# Patient Record
Sex: Female | Born: 1935
Health system: Southern US, Community
[De-identification: ages and names within clinical notes are randomized; demographics above are authoritative.]

## PROBLEM LIST (undated history)

## (undated) DIAGNOSIS — E785 Hyperlipidemia, unspecified: Secondary | ICD-10-CM

## (undated) DIAGNOSIS — I099 Rheumatic heart disease, unspecified: Secondary | ICD-10-CM

## (undated) DIAGNOSIS — I499 Cardiac arrhythmia, unspecified: Secondary | ICD-10-CM

## (undated) DIAGNOSIS — I10 Essential (primary) hypertension: Secondary | ICD-10-CM

## (undated) DIAGNOSIS — I509 Heart failure, unspecified: Secondary | ICD-10-CM

## (undated) HISTORY — DX: Hyperlipidemia, unspecified: E78.5

## (undated) HISTORY — PX: APPENDECTOMY: SHX54

## (undated) HISTORY — DX: Rheumatic heart disease, unspecified: I09.9

## (undated) HISTORY — DX: Cardiac arrhythmia, unspecified: I49.9

## (undated) HISTORY — DX: Heart failure, unspecified: I50.9

---

## 2011-11-26 DIAGNOSIS — J309 Allergic rhinitis, unspecified: Secondary | ICD-10-CM | POA: Diagnosis not present

## 2011-11-26 DIAGNOSIS — E538 Deficiency of other specified B group vitamins: Secondary | ICD-10-CM | POA: Diagnosis not present

## 2011-11-26 DIAGNOSIS — Z0000 Encounter for general adult medical examination without abnormal findings: Secondary | ICD-10-CM | POA: Diagnosis not present

## 2011-11-26 DIAGNOSIS — H9319 Tinnitus, unspecified ear: Secondary | ICD-10-CM | POA: Diagnosis not present

## 2011-12-10 DIAGNOSIS — I10 Essential (primary) hypertension: Secondary | ICD-10-CM | POA: Diagnosis not present

## 2011-12-10 DIAGNOSIS — Z0000 Encounter for general adult medical examination without abnormal findings: Secondary | ICD-10-CM | POA: Diagnosis not present

## 2011-12-10 DIAGNOSIS — H9319 Tinnitus, unspecified ear: Secondary | ICD-10-CM | POA: Diagnosis not present

## 2011-12-10 DIAGNOSIS — F411 Generalized anxiety disorder: Secondary | ICD-10-CM | POA: Diagnosis not present

## 2011-12-10 DIAGNOSIS — R079 Chest pain, unspecified: Secondary | ICD-10-CM | POA: Diagnosis not present

## 2011-12-24 DIAGNOSIS — M159 Polyosteoarthritis, unspecified: Secondary | ICD-10-CM | POA: Diagnosis not present

## 2011-12-24 DIAGNOSIS — I7090 Unspecified atherosclerosis: Secondary | ICD-10-CM | POA: Diagnosis not present

## 2011-12-24 DIAGNOSIS — G252 Other specified forms of tremor: Secondary | ICD-10-CM | POA: Diagnosis not present

## 2011-12-24 DIAGNOSIS — G250 Essential tremor: Secondary | ICD-10-CM | POA: Diagnosis not present

## 2011-12-24 DIAGNOSIS — I10 Essential (primary) hypertension: Secondary | ICD-10-CM | POA: Diagnosis not present

## 2011-12-24 DIAGNOSIS — I70209 Unspecified atherosclerosis of native arteries of extremities, unspecified extremity: Secondary | ICD-10-CM | POA: Diagnosis not present

## 2011-12-26 DIAGNOSIS — I10 Essential (primary) hypertension: Secondary | ICD-10-CM | POA: Diagnosis not present

## 2011-12-26 DIAGNOSIS — I7090 Unspecified atherosclerosis: Secondary | ICD-10-CM | POA: Diagnosis not present

## 2011-12-26 DIAGNOSIS — E538 Deficiency of other specified B group vitamins: Secondary | ICD-10-CM | POA: Diagnosis not present

## 2012-02-12 DIAGNOSIS — E538 Deficiency of other specified B group vitamins: Secondary | ICD-10-CM | POA: Diagnosis not present

## 2012-02-18 DIAGNOSIS — J309 Allergic rhinitis, unspecified: Secondary | ICD-10-CM | POA: Diagnosis not present

## 2012-02-18 DIAGNOSIS — Z0000 Encounter for general adult medical examination without abnormal findings: Secondary | ICD-10-CM | POA: Diagnosis not present

## 2012-02-18 DIAGNOSIS — L570 Actinic keratosis: Secondary | ICD-10-CM | POA: Diagnosis not present

## 2012-02-18 DIAGNOSIS — F411 Generalized anxiety disorder: Secondary | ICD-10-CM | POA: Diagnosis not present

## 2012-02-18 DIAGNOSIS — I10 Essential (primary) hypertension: Secondary | ICD-10-CM | POA: Diagnosis not present

## 2012-02-28 DIAGNOSIS — D485 Neoplasm of uncertain behavior of skin: Secondary | ICD-10-CM | POA: Diagnosis not present

## 2012-03-17 DIAGNOSIS — L723 Sebaceous cyst: Secondary | ICD-10-CM | POA: Diagnosis not present

## 2012-03-17 DIAGNOSIS — L578 Other skin changes due to chronic exposure to nonionizing radiation: Secondary | ICD-10-CM | POA: Diagnosis not present

## 2012-03-17 DIAGNOSIS — L821 Other seborrheic keratosis: Secondary | ICD-10-CM | POA: Diagnosis not present

## 2012-03-18 DIAGNOSIS — H26499 Other secondary cataract, unspecified eye: Secondary | ICD-10-CM | POA: Diagnosis not present

## 2012-03-24 DIAGNOSIS — D518 Other vitamin B12 deficiency anemias: Secondary | ICD-10-CM | POA: Diagnosis not present

## 2012-03-24 DIAGNOSIS — G250 Essential tremor: Secondary | ICD-10-CM | POA: Diagnosis not present

## 2012-03-24 DIAGNOSIS — G252 Other specified forms of tremor: Secondary | ICD-10-CM | POA: Diagnosis not present

## 2012-03-24 DIAGNOSIS — I10 Essential (primary) hypertension: Secondary | ICD-10-CM | POA: Diagnosis not present

## 2012-03-24 DIAGNOSIS — J309 Allergic rhinitis, unspecified: Secondary | ICD-10-CM | POA: Diagnosis not present

## 2012-03-24 DIAGNOSIS — IMO0001 Reserved for inherently not codable concepts without codable children: Secondary | ICD-10-CM | POA: Diagnosis not present

## 2012-03-26 DIAGNOSIS — Z1231 Encounter for screening mammogram for malignant neoplasm of breast: Secondary | ICD-10-CM | POA: Diagnosis not present

## 2012-04-07 DIAGNOSIS — E785 Hyperlipidemia, unspecified: Secondary | ICD-10-CM | POA: Diagnosis not present

## 2012-04-07 DIAGNOSIS — IMO0001 Reserved for inherently not codable concepts without codable children: Secondary | ICD-10-CM | POA: Diagnosis not present

## 2012-04-07 DIAGNOSIS — I10 Essential (primary) hypertension: Secondary | ICD-10-CM | POA: Diagnosis not present

## 2012-04-09 DIAGNOSIS — J309 Allergic rhinitis, unspecified: Secondary | ICD-10-CM | POA: Diagnosis not present

## 2012-04-09 DIAGNOSIS — I10 Essential (primary) hypertension: Secondary | ICD-10-CM | POA: Diagnosis not present

## 2012-04-09 DIAGNOSIS — R42 Dizziness and giddiness: Secondary | ICD-10-CM | POA: Diagnosis not present

## 2012-04-09 DIAGNOSIS — J019 Acute sinusitis, unspecified: Secondary | ICD-10-CM | POA: Diagnosis not present

## 2012-04-09 DIAGNOSIS — H9319 Tinnitus, unspecified ear: Secondary | ICD-10-CM | POA: Diagnosis not present

## 2012-04-22 DIAGNOSIS — D518 Other vitamin B12 deficiency anemias: Secondary | ICD-10-CM | POA: Diagnosis not present

## 2012-04-29 DIAGNOSIS — H9319 Tinnitus, unspecified ear: Secondary | ICD-10-CM | POA: Diagnosis not present

## 2012-04-29 DIAGNOSIS — I10 Essential (primary) hypertension: Secondary | ICD-10-CM | POA: Diagnosis not present

## 2012-04-29 DIAGNOSIS — M269 Dentofacial anomaly, unspecified: Secondary | ICD-10-CM | POA: Diagnosis not present

## 2012-04-29 DIAGNOSIS — Z0000 Encounter for general adult medical examination without abnormal findings: Secondary | ICD-10-CM | POA: Diagnosis not present

## 2012-05-05 DIAGNOSIS — M542 Cervicalgia: Secondary | ICD-10-CM | POA: Diagnosis not present

## 2012-05-05 DIAGNOSIS — H9319 Tinnitus, unspecified ear: Secondary | ICD-10-CM | POA: Diagnosis not present

## 2012-05-05 DIAGNOSIS — Z0000 Encounter for general adult medical examination without abnormal findings: Secondary | ICD-10-CM | POA: Diagnosis not present

## 2012-05-05 DIAGNOSIS — G43909 Migraine, unspecified, not intractable, without status migrainosus: Secondary | ICD-10-CM | POA: Diagnosis not present

## 2012-05-06 DIAGNOSIS — N952 Postmenopausal atrophic vaginitis: Secondary | ICD-10-CM | POA: Diagnosis not present

## 2012-05-06 DIAGNOSIS — Z124 Encounter for screening for malignant neoplasm of cervix: Secondary | ICD-10-CM | POA: Diagnosis not present

## 2012-05-06 DIAGNOSIS — K921 Melena: Secondary | ICD-10-CM | POA: Diagnosis not present

## 2012-05-06 DIAGNOSIS — K5901 Slow transit constipation: Secondary | ICD-10-CM | POA: Diagnosis not present

## 2012-05-07 DIAGNOSIS — Z0000 Encounter for general adult medical examination without abnormal findings: Secondary | ICD-10-CM | POA: Diagnosis not present

## 2012-05-07 DIAGNOSIS — J019 Acute sinusitis, unspecified: Secondary | ICD-10-CM | POA: Diagnosis not present

## 2012-05-08 DIAGNOSIS — J329 Chronic sinusitis, unspecified: Secondary | ICD-10-CM | POA: Diagnosis not present

## 2012-05-08 DIAGNOSIS — H9319 Tinnitus, unspecified ear: Secondary | ICD-10-CM | POA: Diagnosis not present

## 2012-05-14 DIAGNOSIS — I10 Essential (primary) hypertension: Secondary | ICD-10-CM | POA: Diagnosis not present

## 2012-05-14 DIAGNOSIS — I7090 Unspecified atherosclerosis: Secondary | ICD-10-CM | POA: Diagnosis not present

## 2012-05-14 DIAGNOSIS — J329 Chronic sinusitis, unspecified: Secondary | ICD-10-CM | POA: Diagnosis not present

## 2012-05-14 DIAGNOSIS — Z0000 Encounter for general adult medical examination without abnormal findings: Secondary | ICD-10-CM | POA: Diagnosis not present

## 2012-05-14 DIAGNOSIS — J309 Allergic rhinitis, unspecified: Secondary | ICD-10-CM | POA: Diagnosis not present

## 2012-05-14 DIAGNOSIS — J019 Acute sinusitis, unspecified: Secondary | ICD-10-CM | POA: Diagnosis not present

## 2012-05-16 DIAGNOSIS — I10 Essential (primary) hypertension: Secondary | ICD-10-CM | POA: Diagnosis not present

## 2012-05-16 DIAGNOSIS — R5383 Other fatigue: Secondary | ICD-10-CM | POA: Diagnosis not present

## 2012-05-16 DIAGNOSIS — R5381 Other malaise: Secondary | ICD-10-CM | POA: Diagnosis not present

## 2012-05-16 DIAGNOSIS — E876 Hypokalemia: Secondary | ICD-10-CM | POA: Diagnosis not present

## 2012-05-16 DIAGNOSIS — I4949 Other premature depolarization: Secondary | ICD-10-CM | POA: Diagnosis not present

## 2012-05-18 DIAGNOSIS — E876 Hypokalemia: Secondary | ICD-10-CM | POA: Diagnosis not present

## 2012-05-18 DIAGNOSIS — E538 Deficiency of other specified B group vitamins: Secondary | ICD-10-CM | POA: Diagnosis not present

## 2012-05-18 DIAGNOSIS — I10 Essential (primary) hypertension: Secondary | ICD-10-CM | POA: Diagnosis not present

## 2012-05-18 DIAGNOSIS — Z0000 Encounter for general adult medical examination without abnormal findings: Secondary | ICD-10-CM | POA: Diagnosis not present

## 2012-05-18 DIAGNOSIS — M6281 Muscle weakness (generalized): Secondary | ICD-10-CM | POA: Diagnosis not present

## 2012-05-18 DIAGNOSIS — E871 Hypo-osmolality and hyponatremia: Secondary | ICD-10-CM | POA: Diagnosis not present

## 2012-05-19 DIAGNOSIS — H9319 Tinnitus, unspecified ear: Secondary | ICD-10-CM | POA: Diagnosis not present

## 2012-05-19 DIAGNOSIS — H814 Vertigo of central origin: Secondary | ICD-10-CM | POA: Diagnosis not present

## 2012-05-19 DIAGNOSIS — H905 Unspecified sensorineural hearing loss: Secondary | ICD-10-CM | POA: Diagnosis not present

## 2012-05-27 DIAGNOSIS — I10 Essential (primary) hypertension: Secondary | ICD-10-CM | POA: Diagnosis not present

## 2012-06-09 DIAGNOSIS — Z0000 Encounter for general adult medical examination without abnormal findings: Secondary | ICD-10-CM | POA: Diagnosis not present

## 2012-06-09 DIAGNOSIS — I059 Rheumatic mitral valve disease, unspecified: Secondary | ICD-10-CM | POA: Diagnosis not present

## 2012-06-09 DIAGNOSIS — I219 Acute myocardial infarction, unspecified: Secondary | ICD-10-CM | POA: Diagnosis not present

## 2012-06-09 DIAGNOSIS — I10 Essential (primary) hypertension: Secondary | ICD-10-CM | POA: Diagnosis not present

## 2012-06-09 DIAGNOSIS — F411 Generalized anxiety disorder: Secondary | ICD-10-CM | POA: Diagnosis not present

## 2012-06-09 DIAGNOSIS — E785 Hyperlipidemia, unspecified: Secondary | ICD-10-CM | POA: Diagnosis not present

## 2012-06-09 DIAGNOSIS — I739 Peripheral vascular disease, unspecified: Secondary | ICD-10-CM | POA: Diagnosis not present

## 2012-06-09 DIAGNOSIS — R05 Cough: Secondary | ICD-10-CM | POA: Diagnosis not present

## 2012-06-09 DIAGNOSIS — R0789 Other chest pain: Secondary | ICD-10-CM | POA: Diagnosis not present

## 2012-06-09 DIAGNOSIS — E538 Deficiency of other specified B group vitamins: Secondary | ICD-10-CM | POA: Diagnosis not present

## 2012-06-09 DIAGNOSIS — R110 Nausea: Secondary | ICD-10-CM | POA: Diagnosis not present

## 2012-06-09 DIAGNOSIS — R079 Chest pain, unspecified: Secondary | ICD-10-CM | POA: Diagnosis not present

## 2012-06-10 DIAGNOSIS — I517 Cardiomegaly: Secondary | ICD-10-CM | POA: Diagnosis not present

## 2012-06-10 DIAGNOSIS — R079 Chest pain, unspecified: Secondary | ICD-10-CM | POA: Diagnosis not present

## 2012-06-10 DIAGNOSIS — I00 Rheumatic fever without heart involvement: Secondary | ICD-10-CM | POA: Diagnosis not present

## 2012-06-10 DIAGNOSIS — I359 Nonrheumatic aortic valve disorder, unspecified: Secondary | ICD-10-CM | POA: Diagnosis not present

## 2012-06-10 DIAGNOSIS — I059 Rheumatic mitral valve disease, unspecified: Secondary | ICD-10-CM | POA: Diagnosis not present

## 2012-06-10 DIAGNOSIS — R9431 Abnormal electrocardiogram [ECG] [EKG]: Secondary | ICD-10-CM | POA: Diagnosis not present

## 2012-06-10 DIAGNOSIS — I10 Essential (primary) hypertension: Secondary | ICD-10-CM | POA: Diagnosis not present

## 2012-06-18 DIAGNOSIS — I10 Essential (primary) hypertension: Secondary | ICD-10-CM | POA: Diagnosis not present

## 2012-06-18 DIAGNOSIS — Z0000 Encounter for general adult medical examination without abnormal findings: Secondary | ICD-10-CM | POA: Diagnosis not present

## 2012-06-18 DIAGNOSIS — E876 Hypokalemia: Secondary | ICD-10-CM | POA: Diagnosis not present

## 2012-06-18 DIAGNOSIS — F411 Generalized anxiety disorder: Secondary | ICD-10-CM | POA: Diagnosis not present

## 2012-06-18 DIAGNOSIS — R079 Chest pain, unspecified: Secondary | ICD-10-CM | POA: Diagnosis not present

## 2012-06-18 DIAGNOSIS — K589 Irritable bowel syndrome without diarrhea: Secondary | ICD-10-CM | POA: Diagnosis not present

## 2012-06-22 DIAGNOSIS — R079 Chest pain, unspecified: Secondary | ICD-10-CM | POA: Diagnosis not present

## 2012-06-22 DIAGNOSIS — R4182 Altered mental status, unspecified: Secondary | ICD-10-CM | POA: Diagnosis not present

## 2012-06-22 DIAGNOSIS — R51 Headache: Secondary | ICD-10-CM | POA: Diagnosis not present

## 2012-06-22 DIAGNOSIS — R209 Unspecified disturbances of skin sensation: Secondary | ICD-10-CM | POA: Diagnosis not present

## 2012-06-22 DIAGNOSIS — R5383 Other fatigue: Secondary | ICD-10-CM | POA: Diagnosis not present

## 2012-06-22 DIAGNOSIS — R5381 Other malaise: Secondary | ICD-10-CM | POA: Diagnosis not present

## 2012-06-22 DIAGNOSIS — I10 Essential (primary) hypertension: Secondary | ICD-10-CM | POA: Diagnosis not present

## 2012-06-22 DIAGNOSIS — R61 Generalized hyperhidrosis: Secondary | ICD-10-CM | POA: Diagnosis not present

## 2012-06-22 DIAGNOSIS — I209 Angina pectoris, unspecified: Secondary | ICD-10-CM | POA: Diagnosis not present

## 2012-06-22 DIAGNOSIS — R0789 Other chest pain: Secondary | ICD-10-CM | POA: Diagnosis not present

## 2012-06-22 DIAGNOSIS — G459 Transient cerebral ischemic attack, unspecified: Secondary | ICD-10-CM | POA: Diagnosis not present

## 2012-06-25 DIAGNOSIS — G250 Essential tremor: Secondary | ICD-10-CM | POA: Diagnosis not present

## 2012-06-25 DIAGNOSIS — I10 Essential (primary) hypertension: Secondary | ICD-10-CM | POA: Diagnosis not present

## 2012-06-25 DIAGNOSIS — Z0000 Encounter for general adult medical examination without abnormal findings: Secondary | ICD-10-CM | POA: Diagnosis not present

## 2012-06-25 DIAGNOSIS — F411 Generalized anxiety disorder: Secondary | ICD-10-CM | POA: Diagnosis not present

## 2012-07-02 DIAGNOSIS — I10 Essential (primary) hypertension: Secondary | ICD-10-CM | POA: Diagnosis not present

## 2012-07-02 DIAGNOSIS — F411 Generalized anxiety disorder: Secondary | ICD-10-CM | POA: Diagnosis not present

## 2012-07-02 DIAGNOSIS — G252 Other specified forms of tremor: Secondary | ICD-10-CM | POA: Diagnosis not present

## 2012-07-02 DIAGNOSIS — E871 Hypo-osmolality and hyponatremia: Secondary | ICD-10-CM | POA: Diagnosis not present

## 2012-07-02 DIAGNOSIS — G250 Essential tremor: Secondary | ICD-10-CM | POA: Diagnosis not present

## 2012-07-02 DIAGNOSIS — Z23 Encounter for immunization: Secondary | ICD-10-CM | POA: Diagnosis not present

## 2012-07-06 DIAGNOSIS — M353 Polymyalgia rheumatica: Secondary | ICD-10-CM | POA: Diagnosis not present

## 2012-07-06 DIAGNOSIS — R51 Headache: Secondary | ICD-10-CM | POA: Diagnosis not present

## 2012-07-06 DIAGNOSIS — M130 Polyarthritis, unspecified: Secondary | ICD-10-CM | POA: Diagnosis not present

## 2012-07-06 DIAGNOSIS — D518 Other vitamin B12 deficiency anemias: Secondary | ICD-10-CM | POA: Diagnosis not present

## 2012-07-06 DIAGNOSIS — G459 Transient cerebral ischemic attack, unspecified: Secondary | ICD-10-CM | POA: Diagnosis not present

## 2012-07-06 DIAGNOSIS — I10 Essential (primary) hypertension: Secondary | ICD-10-CM | POA: Diagnosis not present

## 2012-07-14 DIAGNOSIS — F411 Generalized anxiety disorder: Secondary | ICD-10-CM | POA: Diagnosis not present

## 2012-07-14 DIAGNOSIS — E559 Vitamin D deficiency, unspecified: Secondary | ICD-10-CM | POA: Diagnosis not present

## 2012-07-14 DIAGNOSIS — R110 Nausea: Secondary | ICD-10-CM | POA: Diagnosis not present

## 2012-07-14 DIAGNOSIS — R5383 Other fatigue: Secondary | ICD-10-CM | POA: Diagnosis not present

## 2012-07-14 DIAGNOSIS — R5381 Other malaise: Secondary | ICD-10-CM | POA: Diagnosis not present

## 2012-07-28 DIAGNOSIS — T561X1A Toxic effect of mercury and its compounds, accidental (unintentional), initial encounter: Secondary | ICD-10-CM | POA: Diagnosis not present

## 2012-07-28 DIAGNOSIS — I10 Essential (primary) hypertension: Secondary | ICD-10-CM | POA: Diagnosis not present

## 2012-07-28 DIAGNOSIS — F411 Generalized anxiety disorder: Secondary | ICD-10-CM | POA: Diagnosis not present

## 2012-07-28 DIAGNOSIS — Z0000 Encounter for general adult medical examination without abnormal findings: Secondary | ICD-10-CM | POA: Diagnosis not present

## 2012-07-31 DIAGNOSIS — D518 Other vitamin B12 deficiency anemias: Secondary | ICD-10-CM | POA: Diagnosis not present

## 2012-07-31 DIAGNOSIS — G252 Other specified forms of tremor: Secondary | ICD-10-CM | POA: Diagnosis not present

## 2012-07-31 DIAGNOSIS — F411 Generalized anxiety disorder: Secondary | ICD-10-CM | POA: Diagnosis not present

## 2012-07-31 DIAGNOSIS — E871 Hypo-osmolality and hyponatremia: Secondary | ICD-10-CM | POA: Diagnosis not present

## 2012-07-31 DIAGNOSIS — I10 Essential (primary) hypertension: Secondary | ICD-10-CM | POA: Diagnosis not present

## 2012-08-03 DIAGNOSIS — E876 Hypokalemia: Secondary | ICD-10-CM | POA: Diagnosis not present

## 2012-08-03 DIAGNOSIS — R197 Diarrhea, unspecified: Secondary | ICD-10-CM | POA: Diagnosis not present

## 2012-08-03 DIAGNOSIS — R109 Unspecified abdominal pain: Secondary | ICD-10-CM | POA: Diagnosis not present

## 2012-08-03 DIAGNOSIS — K5289 Other specified noninfective gastroenteritis and colitis: Secondary | ICD-10-CM | POA: Diagnosis not present

## 2012-08-11 DIAGNOSIS — R1013 Epigastric pain: Secondary | ICD-10-CM | POA: Diagnosis not present

## 2012-08-11 DIAGNOSIS — Z0000 Encounter for general adult medical examination without abnormal findings: Secondary | ICD-10-CM | POA: Diagnosis not present

## 2012-08-11 DIAGNOSIS — F411 Generalized anxiety disorder: Secondary | ICD-10-CM | POA: Diagnosis not present

## 2012-08-13 DIAGNOSIS — R079 Chest pain, unspecified: Secondary | ICD-10-CM | POA: Diagnosis not present

## 2012-08-13 DIAGNOSIS — F411 Generalized anxiety disorder: Secondary | ICD-10-CM | POA: Diagnosis not present

## 2012-08-13 DIAGNOSIS — Z0000 Encounter for general adult medical examination without abnormal findings: Secondary | ICD-10-CM | POA: Diagnosis not present

## 2012-08-13 DIAGNOSIS — R1013 Epigastric pain: Secondary | ICD-10-CM | POA: Diagnosis not present

## 2012-08-19 DIAGNOSIS — M6281 Muscle weakness (generalized): Secondary | ICD-10-CM | POA: Diagnosis not present

## 2012-08-19 DIAGNOSIS — I7090 Unspecified atherosclerosis: Secondary | ICD-10-CM | POA: Diagnosis not present

## 2012-08-19 DIAGNOSIS — D518 Other vitamin B12 deficiency anemias: Secondary | ICD-10-CM | POA: Diagnosis not present

## 2012-08-19 DIAGNOSIS — I10 Essential (primary) hypertension: Secondary | ICD-10-CM | POA: Diagnosis not present

## 2012-08-19 DIAGNOSIS — K589 Irritable bowel syndrome without diarrhea: Secondary | ICD-10-CM | POA: Diagnosis not present

## 2012-09-01 DIAGNOSIS — R1013 Epigastric pain: Secondary | ICD-10-CM | POA: Diagnosis not present

## 2012-09-01 DIAGNOSIS — F411 Generalized anxiety disorder: Secondary | ICD-10-CM | POA: Diagnosis not present

## 2012-09-01 DIAGNOSIS — H9319 Tinnitus, unspecified ear: Secondary | ICD-10-CM | POA: Diagnosis not present

## 2012-09-01 DIAGNOSIS — K5900 Constipation, unspecified: Secondary | ICD-10-CM | POA: Diagnosis not present

## 2012-09-01 DIAGNOSIS — Z0000 Encounter for general adult medical examination without abnormal findings: Secondary | ICD-10-CM | POA: Diagnosis not present

## 2012-09-28 DIAGNOSIS — R1032 Left lower quadrant pain: Secondary | ICD-10-CM | POA: Diagnosis not present

## 2012-09-28 DIAGNOSIS — B9789 Other viral agents as the cause of diseases classified elsewhere: Secondary | ICD-10-CM | POA: Diagnosis not present

## 2012-09-28 DIAGNOSIS — Z0000 Encounter for general adult medical examination without abnormal findings: Secondary | ICD-10-CM | POA: Diagnosis not present

## 2012-10-01 DIAGNOSIS — R609 Edema, unspecified: Secondary | ICD-10-CM | POA: Diagnosis not present

## 2012-10-01 DIAGNOSIS — Z0000 Encounter for general adult medical examination without abnormal findings: Secondary | ICD-10-CM | POA: Diagnosis not present

## 2012-10-01 DIAGNOSIS — I10 Essential (primary) hypertension: Secondary | ICD-10-CM | POA: Diagnosis not present

## 2012-10-12 DIAGNOSIS — Z0000 Encounter for general adult medical examination without abnormal findings: Secondary | ICD-10-CM | POA: Diagnosis not present

## 2012-10-12 DIAGNOSIS — N649 Disorder of breast, unspecified: Secondary | ICD-10-CM | POA: Diagnosis not present

## 2012-10-12 DIAGNOSIS — I2510 Atherosclerotic heart disease of native coronary artery without angina pectoris: Secondary | ICD-10-CM | POA: Diagnosis not present

## 2012-10-12 DIAGNOSIS — I10 Essential (primary) hypertension: Secondary | ICD-10-CM | POA: Diagnosis not present

## 2012-10-14 DIAGNOSIS — N644 Mastodynia: Secondary | ICD-10-CM | POA: Diagnosis not present

## 2012-10-26 DIAGNOSIS — I6529 Occlusion and stenosis of unspecified carotid artery: Secondary | ICD-10-CM | POA: Diagnosis not present

## 2012-11-09 DIAGNOSIS — J328 Other chronic sinusitis: Secondary | ICD-10-CM | POA: Diagnosis not present

## 2012-11-09 DIAGNOSIS — J301 Allergic rhinitis due to pollen: Secondary | ICD-10-CM | POA: Diagnosis not present

## 2012-11-09 DIAGNOSIS — H908 Mixed conductive and sensorineural hearing loss, unspecified: Secondary | ICD-10-CM | POA: Diagnosis not present

## 2012-11-17 DIAGNOSIS — H908 Mixed conductive and sensorineural hearing loss, unspecified: Secondary | ICD-10-CM | POA: Diagnosis not present

## 2012-11-24 DIAGNOSIS — J310 Chronic rhinitis: Secondary | ICD-10-CM | POA: Diagnosis not present

## 2013-01-19 DIAGNOSIS — M6281 Muscle weakness (generalized): Secondary | ICD-10-CM | POA: Diagnosis not present

## 2013-01-19 DIAGNOSIS — L218 Other seborrheic dermatitis: Secondary | ICD-10-CM | POA: Diagnosis not present

## 2013-01-19 DIAGNOSIS — I10 Essential (primary) hypertension: Secondary | ICD-10-CM | POA: Diagnosis not present

## 2013-01-19 DIAGNOSIS — E785 Hyperlipidemia, unspecified: Secondary | ICD-10-CM | POA: Diagnosis not present

## 2013-02-23 DIAGNOSIS — I10 Essential (primary) hypertension: Secondary | ICD-10-CM | POA: Diagnosis not present

## 2013-02-23 DIAGNOSIS — D518 Other vitamin B12 deficiency anemias: Secondary | ICD-10-CM | POA: Diagnosis not present

## 2013-02-23 DIAGNOSIS — G609 Hereditary and idiopathic neuropathy, unspecified: Secondary | ICD-10-CM | POA: Diagnosis not present

## 2013-02-23 DIAGNOSIS — I499 Cardiac arrhythmia, unspecified: Secondary | ICD-10-CM | POA: Diagnosis not present

## 2013-02-23 DIAGNOSIS — E876 Hypokalemia: Secondary | ICD-10-CM | POA: Diagnosis not present

## 2013-02-23 DIAGNOSIS — I739 Peripheral vascular disease, unspecified: Secondary | ICD-10-CM | POA: Diagnosis not present

## 2013-02-23 DIAGNOSIS — E871 Hypo-osmolality and hyponatremia: Secondary | ICD-10-CM | POA: Diagnosis not present

## 2013-02-26 DIAGNOSIS — I10 Essential (primary) hypertension: Secondary | ICD-10-CM | POA: Diagnosis not present

## 2013-02-26 DIAGNOSIS — E871 Hypo-osmolality and hyponatremia: Secondary | ICD-10-CM | POA: Diagnosis not present

## 2013-02-26 DIAGNOSIS — D518 Other vitamin B12 deficiency anemias: Secondary | ICD-10-CM | POA: Diagnosis not present

## 2013-02-26 DIAGNOSIS — M6281 Muscle weakness (generalized): Secondary | ICD-10-CM | POA: Diagnosis not present

## 2013-03-19 DIAGNOSIS — I10 Essential (primary) hypertension: Secondary | ICD-10-CM | POA: Diagnosis not present

## 2013-03-19 DIAGNOSIS — R51 Headache: Secondary | ICD-10-CM | POA: Diagnosis not present

## 2013-03-19 DIAGNOSIS — D518 Other vitamin B12 deficiency anemias: Secondary | ICD-10-CM | POA: Diagnosis not present

## 2013-03-19 DIAGNOSIS — E871 Hypo-osmolality and hyponatremia: Secondary | ICD-10-CM | POA: Diagnosis not present

## 2013-05-24 ENCOUNTER — Emergency Department (INDEPENDENT_AMBULATORY_CARE_PROVIDER_SITE_OTHER)
Admission: EM | Admit: 2013-05-24 | Discharge: 2013-05-24 | Disposition: A | Discharge status: Home / Self Care | Payer: Medicare Other | Source: Home / Self Care | Attending: Emergency Medicine | Admitting: Emergency Medicine

## 2013-05-24 ENCOUNTER — Encounter (HOSPITAL_COMMUNITY): Payer: Self-pay | Admitting: Emergency Medicine

## 2013-05-24 DIAGNOSIS — I10 Essential (primary) hypertension: Secondary | ICD-10-CM

## 2013-05-24 LAB — POCT I-STAT, CHEM 8
BUN: 10 mg/dL (ref 6–23)
Chloride: 99 mEq/L (ref 96–112)
Sodium: 138 mEq/L (ref 135–145)

## 2013-05-24 MED ORDER — NEBIVOLOL HCL 10 MG PO TABS: 10.0000 mg | ORAL_TABLET | Freq: Three times a day (TID) | ORAL | Status: DC

## 2013-05-24 MED ORDER — SIMVASTATIN 40 MG PO TABS: 40.0000 mg | ORAL_TABLET | Freq: Every evening | ORAL | Status: DC

## 2013-05-24 NOTE — ED Provider Notes (Signed)
Chief Complaint:   Chief Complaint  Patient presents with  . Hypertension    History of Present Illness:   Amy [name] is a 79 year old female who recently moved here from [place] to be closer to her son. She has a history of high blood pressure since the 1990s. She's been on diastolic 10 mg 3 times a day with no medication side effects. She denies dizziness, blurry vision, chest pain, tightness, pressure, or shortness of breath. She has occasional headaches. She's had no history of kidney problems, diabetes, and she's not a smoker or drinker. She states she has had a mini stroke in the past and takes aspirin. She also takes simvastatin or hypercholesterolemia, Nasonex for rhinitis, and Xanax as needed for anxiety.  Review of Systems:  Other than noted above, the patient denies any of the following symptoms: Systemic:  No fever, chills, fatigue, weight loss or gain. Respiratory:  No coughing, wheezing, or shortness of breath. Cardiac:  No chest pain, tightness, pressure, palpitations, syncope, or edema. Neuro:  No headache, dizziness, blurred vision, weakness, paresthesias, or strokelike symptoms.  PMFSH:  Past medical history, family history, social history, meds, and allergies were reviewed.  Physical Exam:   Vital signs:  BP 158/76  Pulse 61  Temp(Src) 97.6 F (36.4 C) (Oral)  Resp 16  SpO2 97% General:  Alert, oriented, in no distress. Lungs:  Breath sounds clear and equal bilaterally.  No wheezes, rales, or rhonchi. Heart:  Regular rhythm, no gallops, murmers, clicks or rubs.  Abdomen:  Soft and flat.  Nontender, no organomegaly or mass.  No pulsatile midline abdominal mass or bruit. Ext:  No edema, pulses full. Neurological exam:  Alert and oriented.  Speech is clear.  No pronator drift.  CNs intact.  Labs:   Results for orders placed during the hospital encounter of 05/24/13  POCT I-STAT, CHEM 8      Result Value Range   Sodium 138  135 - 145 mEq/L   Potassium 3.9  3.5 - 5.1  mEq/L   Chloride 99  96 - 112 mEq/L   BUN 10  6 - 23 mg/dL   Creatinine, Ser 9.62  0.50 - 1.10 mg/dL   Glucose, Bld 95  70 - 99 mg/dL   Calcium, Ion 9.52  8.41 - 1.30 mmol/L   TCO2 27  0 - 100 mmol/L   Hemoglobin 15.0  12.0 - 15.0 g/dL   HCT 32.4  40.1 - 02.7 %    Assessment:  The encounter diagnosis was Hypertension.  Blood pressure was a little bit elevated today. Needs followup with primary care.  Plan:   1.  The following meds were prescribed:   Discharge Medication List as of 05/24/2013  1:15 PM    START taking these medications   Details  !! nebivolol (BYSTOLIC) 10 MG tablet Take 1 tablet (10 mg total) by mouth 3 (three) times daily., Starting 05/24/2013, Until Discontinued, Normal    simvastatin (ZOCOR) 40 MG tablet Take 1 tablet (40 mg total) by mouth every evening., Starting 05/24/2013, Until Discontinued, Normal     !! - Potential duplicate medications found. Please discuss with provider.     2.  The patient was instructed in symptomatic care and handouts were given. Specifically discussed salt and sodium restriction, weight control, and exercise. 3.  The patient was told to return if becoming worse in any way, if no better in 3 or 4 days, and given some red flag symptoms such as severe headache, vision or  neurological changes, chest pain, or shortness of breath that would indicate earlier return. 4.  Follow up with Dr. Maryelizabeth Rowan within the next month. She has enough refills to last for 3 months.    Reuben Likes, MD 05/24/13 [phone]

## 2013-09-13 DIAGNOSIS — J309 Allergic rhinitis, unspecified: Secondary | ICD-10-CM | POA: Diagnosis not present

## 2013-09-13 DIAGNOSIS — I10 Essential (primary) hypertension: Secondary | ICD-10-CM | POA: Diagnosis not present

## 2013-09-13 DIAGNOSIS — R51 Headache: Secondary | ICD-10-CM | POA: Diagnosis not present

## 2013-09-13 DIAGNOSIS — Z7189 Other specified counseling: Secondary | ICD-10-CM | POA: Diagnosis not present

## 2013-09-13 DIAGNOSIS — E785 Hyperlipidemia, unspecified: Secondary | ICD-10-CM | POA: Diagnosis not present

## 2013-09-13 DIAGNOSIS — Z77018 Contact with and (suspected) exposure to other hazardous metals: Secondary | ICD-10-CM | POA: Diagnosis not present

## 2013-09-13 DIAGNOSIS — Z9889 Other specified postprocedural states: Secondary | ICD-10-CM | POA: Diagnosis not present

## 2013-10-08 DIAGNOSIS — Z23 Encounter for immunization: Secondary | ICD-10-CM | POA: Diagnosis not present

## 2013-10-08 DIAGNOSIS — I10 Essential (primary) hypertension: Secondary | ICD-10-CM | POA: Diagnosis not present

## 2014-04-04 DIAGNOSIS — E785 Hyperlipidemia, unspecified: Secondary | ICD-10-CM | POA: Diagnosis not present

## 2014-04-04 DIAGNOSIS — F411 Generalized anxiety disorder: Secondary | ICD-10-CM | POA: Diagnosis not present

## 2014-04-04 DIAGNOSIS — I10 Essential (primary) hypertension: Secondary | ICD-10-CM | POA: Diagnosis not present

## 2014-04-04 DIAGNOSIS — R21 Rash and other nonspecific skin eruption: Secondary | ICD-10-CM | POA: Diagnosis not present

## 2015-01-19 DIAGNOSIS — H6063 Unspecified chronic otitis externa, bilateral: Secondary | ICD-10-CM | POA: Diagnosis not present

## 2015-03-01 DIAGNOSIS — I639 Cerebral infarction, unspecified: Secondary | ICD-10-CM | POA: Diagnosis not present

## 2015-03-01 DIAGNOSIS — G43909 Migraine, unspecified, not intractable, without status migrainosus: Secondary | ICD-10-CM | POA: Diagnosis not present

## 2015-03-01 DIAGNOSIS — F411 Generalized anxiety disorder: Secondary | ICD-10-CM | POA: Diagnosis not present

## 2015-03-01 DIAGNOSIS — M810 Age-related osteoporosis without current pathological fracture: Secondary | ICD-10-CM | POA: Diagnosis not present

## 2015-03-01 DIAGNOSIS — E785 Hyperlipidemia, unspecified: Secondary | ICD-10-CM | POA: Diagnosis not present

## 2015-03-01 DIAGNOSIS — I10 Essential (primary) hypertension: Secondary | ICD-10-CM | POA: Diagnosis not present

## 2015-03-02 DIAGNOSIS — G43909 Migraine, unspecified, not intractable, without status migrainosus: Secondary | ICD-10-CM | POA: Diagnosis not present

## 2015-03-02 DIAGNOSIS — I10 Essential (primary) hypertension: Secondary | ICD-10-CM | POA: Diagnosis not present

## 2015-03-02 DIAGNOSIS — F411 Generalized anxiety disorder: Secondary | ICD-10-CM | POA: Diagnosis not present

## 2015-03-13 ENCOUNTER — Emergency Department (HOSPITAL_COMMUNITY)
Admission: EM | Admit: 2015-03-13 | Discharge: 2015-03-13 | Disposition: A | Discharge status: Home / Self Care | Payer: Medicare Other | Attending: Emergency Medicine | Admitting: Emergency Medicine

## 2015-03-13 ENCOUNTER — Encounter (HOSPITAL_COMMUNITY): Payer: Self-pay | Admitting: Emergency Medicine

## 2015-03-13 ENCOUNTER — Emergency Department (HOSPITAL_COMMUNITY): Payer: Medicare Other

## 2015-03-13 DIAGNOSIS — I499 Cardiac arrhythmia, unspecified: Secondary | ICD-10-CM | POA: Diagnosis not present

## 2015-03-13 DIAGNOSIS — I200 Unstable angina: Secondary | ICD-10-CM | POA: Diagnosis not present

## 2015-03-13 DIAGNOSIS — R251 Tremor, unspecified: Secondary | ICD-10-CM

## 2015-03-13 DIAGNOSIS — Z7982 Long term (current) use of aspirin: Secondary | ICD-10-CM | POA: Diagnosis not present

## 2015-03-13 DIAGNOSIS — H532 Diplopia: Secondary | ICD-10-CM | POA: Diagnosis not present

## 2015-03-13 DIAGNOSIS — Z79899 Other long term (current) drug therapy: Secondary | ICD-10-CM | POA: Diagnosis not present

## 2015-03-13 DIAGNOSIS — R9431 Abnormal electrocardiogram [ECG] [EKG]: Secondary | ICD-10-CM | POA: Diagnosis not present

## 2015-03-13 DIAGNOSIS — I639 Cerebral infarction, unspecified: Secondary | ICD-10-CM | POA: Diagnosis not present

## 2015-03-13 DIAGNOSIS — I10 Essential (primary) hypertension: Secondary | ICD-10-CM | POA: Diagnosis not present

## 2015-03-13 DIAGNOSIS — F411 Generalized anxiety disorder: Secondary | ICD-10-CM | POA: Diagnosis not present

## 2015-03-13 DIAGNOSIS — R079 Chest pain, unspecified: Secondary | ICD-10-CM | POA: Diagnosis not present

## 2015-03-13 DIAGNOSIS — J449 Chronic obstructive pulmonary disease, unspecified: Secondary | ICD-10-CM | POA: Diagnosis not present

## 2015-03-13 HISTORY — DX: Essential (primary) hypertension: I10

## 2015-03-13 LAB — BASIC METABOLIC PANEL
Anion gap: 8 (ref 5–15)
BUN: 12 mg/dL (ref 6–20)
CALCIUM: 9.1 mg/dL (ref 8.9–10.3)
CO2: 28 mmol/L (ref 22–32)
CREATININE: 0.71 mg/dL (ref 0.44–1.00)
Chloride: 101 mmol/L (ref 101–111)
GFR calc Af Amer: >60 mL/min (ref >60)
Glucose, Bld: 98 mg/dL (ref 65–99)
Potassium: 3.9 mmol/L (ref 3.5–5.1)
Sodium: 137 mmol/L (ref 135–145)

## 2015-03-13 LAB — CBC WITH DIFFERENTIAL/PLATELET
Basophils Absolute: 0 10*3/uL (ref 0.0–0.1)
Basophils Relative: 1 % (ref 0–1)
EOS PCT: 2 % (ref 0–5)
Eosinophils Absolute: 0.1 10*3/uL (ref 0.0–0.7)
HCT: 38.2 % (ref 36.0–46.0)
HEMOGLOBIN: 12.7 g/dL (ref 12.0–15.0)
LYMPHS PCT: 32 % (ref 12–46)
Lymphs Abs: 2.1 10*3/uL (ref 0.7–4.0)
MCH: 29.6 pg (ref 26.0–34.0)
MCHC: 33.2 g/dL (ref 30.0–36.0)
MCV: 89 fL (ref 78.0–100.0)
MONO ABS: 0.7 10*3/uL (ref 0.1–1.0)
MONOS PCT: 10 % (ref 3–12)
NEUTROS ABS: 3.7 10*3/uL (ref 1.7–7.7)
NEUTROS PCT: 55 % (ref 43–77)
Platelets: 264 10*3/uL (ref 150–400)
RBC: 4.29 MIL/uL (ref 3.87–5.11)
RDW: 12.6 % (ref 11.5–15.5)
WBC: 6.6 10*3/uL (ref 4.0–10.5)

## 2015-03-13 LAB — I-STAT TROPONIN, ED: Troponin i, poc: 0 ng/mL (ref 0.00–0.08)

## 2015-03-13 NOTE — ED Provider Notes (Signed)
CSN: [id number]     Arrival date & time 03/13/15  1327 History   First MD Initiated Contact with Patient 03/13/15 1610     Chief Complaint  Patient presents with  . Shaking     The history is provided by the patient and a relative. No language interpreter was used.   Amy [name] presents for evaluation of shakiness.  She reports increased generalized "internal" shakiness for the last few weeks.  She started zoloft two weeks ago.  She was referred by her PCP for evaluation due to concern for irregular heart beat.  She denies any fevers, chest pain, sob, palpitations, vomiting, diarrhea, lower extremity edema.  Sxs are moderate and constant in nature.  She was started on zoloft and klonopin for increased stress/anxiety at home.  She denies any SI/HI.   Past Medical History  Diagnosis Date  . Hypertension    History reviewed. No pertinent past surgical history. History reviewed. No pertinent family history. History  Substance Use Topics  . Smoking status: Never Smoker   . Smokeless tobacco: Not on file  . Alcohol Use: No   OB History    No data available     Review of Systems  All other systems reviewed and are negative.     Allergies  Oxycodone  Home Medications   Prior to Admission medications   Medication Sig Start Date End Date Taking? Authorizing Provider  aspirin 81 MG chewable tablet Chew 81 mg by mouth 2 (two) times daily.   Yes Historical Provider, MD  clonazePAM (KLONOPIN) 0.5 MG tablet Take 0.25 mg by mouth 2 (two) times daily. 03/01/15  Yes Historical Provider, MD  losartan (COZAAR) 100 MG tablet Take 100 mg by mouth daily. 03/01/15  Yes Historical Provider, MD  sertraline (ZOLOFT) 50 MG tablet Take 50 mg by mouth daily. 03/02/15  Yes Historical Provider, MD  simvastatin (ZOCOR) 40 MG tablet Take 1 tablet (40 mg total) by mouth every evening. 05/24/13  Yes Harden Mo, MD  nebivolol (BYSTOLIC) 10 MG tablet Take 1 tablet (10 mg total) by mouth 3 (three) times  daily. Patient not taking: Reported on 03/13/2015 05/24/13   Harden Mo, MD   BP 178/73 mmHg  Pulse 71  Temp(Src) 97.5 F (36.4 C) (Oral)  Resp 20  Ht 5\' 1"  (1.549 m)  Wt 133 lb 7 oz (60.527 kg)  BMI 25.23 kg/m2  SpO2 98% Physical Exam  Constitutional: She is oriented to person, place, and time. She appears well-developed and well-nourished.  HENT:  Head: Normocephalic and atraumatic.  Cardiovascular: Normal rate and regular rhythm.   No murmur heard. Pulmonary/Chest: Effort normal and breath sounds normal. No respiratory distress.  Abdominal: Soft. There is no tenderness. There is no rebound and no guarding.  Musculoskeletal: She exhibits no edema or tenderness.  Neurological: She is alert and oriented to person, place, and time.  5/5 strength in all four extremities.  No facial asymmetry.  Resting tremor of head.    Skin: Skin is warm and dry.  Psychiatric: She has a normal mood and affect. Her behavior is normal.  Nursing note and vitals reviewed.   ED Course  Procedures (including critical care time) Labs Review Labs Reviewed  BASIC METABOLIC PANEL  CBC WITH DIFFERENTIAL/PLATELET  Randolm Idol, ED    Imaging Review Dg Chest 2 View  03/13/2015   CLINICAL DATA:  Increased shakiness and irregular heart rate. No chest pain or shortness breath.  EXAM: CHEST  2 VIEW  COMPARISON:  None.  FINDINGS: The lungs are hyperinflated likely secondary to COPD. There is no focal parenchymal opacity. There is no pleural effusion or pneumothorax. The heart and mediastinal contours are unremarkable.  The osseous structures are unremarkable.  IMPRESSION: No active cardiopulmonary disease.   Electronically Signed   By: Kathreen Devoid   On: 03/13/2015 14:47     EKG Interpretation   Date/Time:  Monday Mar 13 2015 13:53:31 EDT Ventricular Rate:  77 PR Interval:  176 QRS Duration: 84 QT Interval:  420 QTC Calculation: 475 R Axis:   61 Text Interpretation:  Sinus rhythm with marked  sinus arrhythmia with  occasional Premature ventricular complexes Possible Anterior infarct , age  undetermined Abnormal ECG Confirmed by Hazle Coca [phone]) on 03/13/2015  3:57:57 PM      MDM   Final diagnoses:  Shakiness  Essential hypertension   Pt here for feeling "shakey."  Hx and presentation not c/w ACS, CVA, SAH, hypertensive urgency.  Pt is noted to be hypertensive in ED but currently asymptomatic.  Discussed with pt discontinuing zoloft and monitoring BP - may need to increase BP meds if BP continues to be elevated.  Discussed home care and return precautions.     Quintella Reichert, MD 03/14/15 [phone]

## 2015-03-13 NOTE — ED Notes (Signed)
Pt sent here from PCP for further eval of increased shakiness and possible irregular HR; pt had recent changes to medication; pt denies CP or SOB

## 2015-03-13 NOTE — ED Notes (Signed)
Dr. Rees at bedside  

## 2015-03-13 NOTE — Discharge Instructions (Signed)
Stop taking your zoloft.  Check your blood pressure 1-2 times daily and write down the number.  Ask your doctor about getting your thyroid checked.     Hypertension Hypertension, commonly called high blood pressure, is when the force of blood pumping through your arteries is too strong. Your arteries are the blood vessels that carry blood from your heart throughout your body. A blood pressure reading consists of a higher number over a lower number, such as 110/72. The higher number (systolic) is the pressure inside your arteries when your heart pumps. The lower number (diastolic) is the pressure inside your arteries when your heart relaxes. Ideally you want your blood pressure below 120/80. Hypertension forces your heart to work harder to pump blood. Your arteries may become narrow or stiff. Having hypertension puts you at risk for heart disease, stroke, and other problems.  RISK FACTORS Some risk factors for high blood pressure are controllable. Others are not.  Risk factors you cannot control include:   Race. You may be at higher risk if you are African American.  Age. Risk increases with age.  Gender. Men are at higher risk than women before age 28 years. After age 1, women are at higher risk than men. Risk factors you can control include:  Not getting enough exercise or physical activity.  Being overweight.  Getting too much fat, sugar, calories, or salt in your diet.  Drinking too much alcohol. SIGNS AND SYMPTOMS Hypertension does not usually cause signs or symptoms. Extremely high blood pressure (hypertensive crisis) may cause headache, anxiety, shortness of breath, and nosebleed. DIAGNOSIS  To check if you have hypertension, your health care provider will measure your blood pressure while you are seated, with your arm held at the level of your heart. It should be measured at least twice using the same arm. Certain conditions can cause a difference in blood pressure between your  right and left arms. A blood pressure reading that is higher than normal on one occasion does not mean that you need treatment. If one blood pressure reading is high, ask your health care provider about having it checked again. TREATMENT  Treating high blood pressure includes making lifestyle changes and possibly taking medicine. Living a healthy lifestyle can help lower high blood pressure. You may need to change some of your habits. Lifestyle changes may include:  Following the DASH diet. This diet is high in fruits, vegetables, and whole grains. It is low in salt, red meat, and added sugars.  Getting at least 2 hours of brisk physical activity every week.  Losing weight if necessary.  Not smoking.  Limiting alcoholic beverages.  Learning ways to reduce stress. If lifestyle changes are not enough to get your blood pressure under control, your health care provider may prescribe medicine. You may need to take more than one. Work closely with your health care provider to understand the risks and benefits. HOME CARE INSTRUCTIONS  Have your blood pressure rechecked as directed by your health care provider.   Take medicines only as directed by your health care provider. Follow the directions carefully. Blood pressure medicines must be taken as prescribed. The medicine does not work as well when you skip doses. Skipping doses also puts you at risk for problems.   Do not smoke.   Monitor your blood pressure at home as directed by your health care provider. SEEK MEDICAL CARE IF:   You think you are having a reaction to medicines taken.  You have recurrent headaches  or feel dizzy.  You have swelling in your ankles.  You have trouble with your vision. SEEK IMMEDIATE MEDICAL CARE IF:  You develop a severe headache or confusion.  You have unusual weakness, numbness, or feel faint.  You have severe chest or abdominal pain.  You vomit repeatedly.  You have trouble  breathing. MAKE SURE YOU:   Understand these instructions.  Will watch your condition.  Will get help right away if you are not doing well or get worse. Document Released: 10/14/2005 Document Revised: 02/28/2014 Document Reviewed: 08/06/2013 [employer] Patient Information 2015 [place], [place]. This information is not intended to replace advice given to you by your health care provider. Make sure you discuss any questions you have with your health care provider.  Tremor Tremor is a rhythmic, involuntary muscular contraction characterized by oscillations (to-and-fro movements) of a part of the body. The most common of all involuntary movements, tremor can affect various body parts such as the hands, head, facial structures, vocal cords, trunk, and legs; most tremors, however, occur in the hands. Tremor often accompanies neurological disorders associated with aging. Although the disorder is not life-threatening, it can be responsible for functional disability and social embarrassment. TREATMENT  There are many types of tremor and several ways in which tremor is classified. The most common classification is by behavioral context or position. There are five categories of tremor within this classification: resting, postural, kinetic, task-specific, and psychogenic. Resting or static tremor occurs when the muscle is at rest, for example when the hands are lying on the lap. This type of tremor is often seen in patients with Parkinson's disease. Postural tremor occurs when a patient attempts to maintain posture, such as holding the hands outstretched. Postural tremors include physiological tremor, essential tremor, tremor with basal ganglia disease (also seen in patients with Parkinson's disease), cerebellar postural tremor, tremor with peripheral neuropathy, post-traumatic tremor, and alcoholic tremor. Kinetic or intention (action) tremor occurs during purposeful movement, for example during finger-to-nose  testing. Task-specific tremor appears when performing goal-oriented tasks such as handwriting, speaking, or standing. This group consists of primary writing tremor, vocal tremor, and orthostatic tremor. Psychogenic tremor occurs in both older and younger patients. The key feature of this tremor is that it dramatically lessens or disappears when the patient is distracted. PROGNOSIS There are some treatment options available for tremor; the appropriate treatment depends on accurate diagnosis of the cause. Some tremors respond to treatment of the underlying condition, for example in some cases of psychogenic tremor treating the patient's underlying mental problem may cause the tremor to disappear. Also, patients with tremor due to Parkinson's disease may be treated with Levodopa drug therapy. Symptomatic drug therapy is available for several other tremors as well. For those cases of tremor in which there is no effective drug treatment, physical measures such as teaching the patient to brace the affected limb during the tremor are sometimes useful. Surgical intervention such as thalamotomy or deep brain stimulation may be useful in certain cases. Document Released: 10/04/2002 Document Revised: 01/06/2012 Document Reviewed: 10/14/2005 [facility] Patient Information 2015 [place], [place]. This information is not intended to replace advice given to you by your health care provider. Make sure you discuss any questions you have with your health care provider.

## 2015-03-16 DIAGNOSIS — I499 Cardiac arrhythmia, unspecified: Secondary | ICD-10-CM | POA: Diagnosis not present

## 2015-03-16 DIAGNOSIS — F411 Generalized anxiety disorder: Secondary | ICD-10-CM | POA: Diagnosis not present

## 2015-03-16 DIAGNOSIS — I10 Essential (primary) hypertension: Secondary | ICD-10-CM | POA: Diagnosis not present

## 2015-03-22 DIAGNOSIS — I499 Cardiac arrhythmia, unspecified: Secondary | ICD-10-CM | POA: Diagnosis not present

## 2015-03-22 DIAGNOSIS — F411 Generalized anxiety disorder: Secondary | ICD-10-CM | POA: Diagnosis not present

## 2015-03-22 DIAGNOSIS — I10 Essential (primary) hypertension: Secondary | ICD-10-CM | POA: Diagnosis not present

## 2015-04-05 DIAGNOSIS — F411 Generalized anxiety disorder: Secondary | ICD-10-CM | POA: Diagnosis not present

## 2015-04-05 DIAGNOSIS — I499 Cardiac arrhythmia, unspecified: Secondary | ICD-10-CM | POA: Diagnosis not present

## 2015-04-05 DIAGNOSIS — I10 Essential (primary) hypertension: Secondary | ICD-10-CM | POA: Diagnosis not present

## 2015-04-05 DIAGNOSIS — E785 Hyperlipidemia, unspecified: Secondary | ICD-10-CM | POA: Diagnosis not present

## 2015-05-11 DIAGNOSIS — R002 Palpitations: Secondary | ICD-10-CM | POA: Diagnosis not present

## 2015-05-11 DIAGNOSIS — I440 Atrioventricular block, first degree: Secondary | ICD-10-CM | POA: Diagnosis not present

## 2015-05-11 DIAGNOSIS — E780 Pure hypercholesterolemia: Secondary | ICD-10-CM | POA: Diagnosis not present

## 2015-05-11 DIAGNOSIS — I10 Essential (primary) hypertension: Secondary | ICD-10-CM | POA: Diagnosis not present

## 2015-05-30 DIAGNOSIS — I639 Cerebral infarction, unspecified: Secondary | ICD-10-CM | POA: Diagnosis not present

## 2015-05-30 DIAGNOSIS — F411 Generalized anxiety disorder: Secondary | ICD-10-CM | POA: Diagnosis not present

## 2015-05-30 DIAGNOSIS — I10 Essential (primary) hypertension: Secondary | ICD-10-CM | POA: Diagnosis not present

## 2015-07-28 ENCOUNTER — Other Ambulatory Visit: Payer: Self-pay | Admitting: Family Medicine

## 2015-07-28 DIAGNOSIS — I779 Disorder of arteries and arterioles, unspecified: Secondary | ICD-10-CM

## 2015-07-28 DIAGNOSIS — E785 Hyperlipidemia, unspecified: Secondary | ICD-10-CM | POA: Diagnosis not present

## 2015-07-28 DIAGNOSIS — F411 Generalized anxiety disorder: Secondary | ICD-10-CM | POA: Diagnosis not present

## 2015-07-28 DIAGNOSIS — Z23 Encounter for immunization: Secondary | ICD-10-CM | POA: Diagnosis not present

## 2015-07-28 DIAGNOSIS — I10 Essential (primary) hypertension: Secondary | ICD-10-CM | POA: Diagnosis not present

## 2015-07-28 DIAGNOSIS — I739 Peripheral vascular disease, unspecified: Principal | ICD-10-CM

## 2015-07-31 ENCOUNTER — Other Ambulatory Visit: Payer: Medicare Other

## 2015-11-10 DIAGNOSIS — J309 Allergic rhinitis, unspecified: Secondary | ICD-10-CM | POA: Diagnosis not present

## 2015-11-10 DIAGNOSIS — E782 Mixed hyperlipidemia: Secondary | ICD-10-CM | POA: Diagnosis not present

## 2015-11-10 DIAGNOSIS — I10 Essential (primary) hypertension: Secondary | ICD-10-CM | POA: Diagnosis not present

## 2015-11-10 DIAGNOSIS — F411 Generalized anxiety disorder: Secondary | ICD-10-CM | POA: Diagnosis not present

## 2016-03-15 DIAGNOSIS — Z79899 Other long term (current) drug therapy: Secondary | ICD-10-CM | POA: Diagnosis not present

## 2016-03-15 DIAGNOSIS — I10 Essential (primary) hypertension: Secondary | ICD-10-CM | POA: Diagnosis not present

## 2016-03-15 DIAGNOSIS — E785 Hyperlipidemia, unspecified: Secondary | ICD-10-CM | POA: Diagnosis not present

## 2016-03-19 DIAGNOSIS — F411 Generalized anxiety disorder: Secondary | ICD-10-CM | POA: Diagnosis not present

## 2016-03-19 DIAGNOSIS — E782 Mixed hyperlipidemia: Secondary | ICD-10-CM | POA: Diagnosis not present

## 2016-03-19 DIAGNOSIS — I639 Cerebral infarction, unspecified: Secondary | ICD-10-CM | POA: Diagnosis not present

## 2016-03-19 DIAGNOSIS — I10 Essential (primary) hypertension: Secondary | ICD-10-CM | POA: Diagnosis not present

## 2016-03-19 DIAGNOSIS — Z79899 Other long term (current) drug therapy: Secondary | ICD-10-CM | POA: Diagnosis not present

## 2016-03-22 ENCOUNTER — Encounter (HOSPITAL_COMMUNITY): Payer: Self-pay | Admitting: Emergency Medicine

## 2016-03-22 ENCOUNTER — Emergency Department (HOSPITAL_COMMUNITY): Payer: Medicare Other

## 2016-03-22 ENCOUNTER — Emergency Department (HOSPITAL_COMMUNITY)
Admission: EM | Admit: 2016-03-22 | Discharge: 2016-03-22 | Disposition: A | Discharge status: Home / Self Care | Payer: Medicare Other | Attending: Emergency Medicine | Admitting: Emergency Medicine

## 2016-03-22 DIAGNOSIS — F13930 Sedative, hypnotic or anxiolytic use, unspecified with withdrawal, uncomplicated: Secondary | ICD-10-CM

## 2016-03-22 DIAGNOSIS — I10 Essential (primary) hypertension: Secondary | ICD-10-CM | POA: Insufficient documentation

## 2016-03-22 DIAGNOSIS — Z7982 Long term (current) use of aspirin: Secondary | ICD-10-CM | POA: Diagnosis not present

## 2016-03-22 DIAGNOSIS — R55 Syncope and collapse: Secondary | ICD-10-CM | POA: Diagnosis present

## 2016-03-22 DIAGNOSIS — G459 Transient cerebral ischemic attack, unspecified: Secondary | ICD-10-CM | POA: Diagnosis not present

## 2016-03-22 DIAGNOSIS — F13230 Sedative, hypnotic or anxiolytic dependence with withdrawal, uncomplicated: Secondary | ICD-10-CM | POA: Insufficient documentation

## 2016-03-22 LAB — DIFFERENTIAL
BASOS PCT: 1 %
Basophils Absolute: 0 10*3/uL (ref 0.0–0.1)
EOS ABS: 0.1 10*3/uL (ref 0.0–0.7)
EOS PCT: 1 %
Lymphocytes Relative: 28 %
Lymphs Abs: 1.9 10*3/uL (ref 0.7–4.0)
Monocytes Absolute: 0.7 10*3/uL (ref 0.1–1.0)
Monocytes Relative: 11 %
NEUTROS PCT: 59 %
Neutro Abs: 4.1 10*3/uL (ref 1.7–7.7)

## 2016-03-22 LAB — I-STAT CHEM 8, ED
BUN: 15 mg/dL (ref 6–20)
CALCIUM ION: 1.14 mmol/L (ref 1.13–1.30)
Chloride: 101 mmol/L (ref 101–111)
Creatinine, Ser: 0.6 mg/dL (ref 0.44–1.00)
Glucose, Bld: 99 mg/dL (ref 65–99)
HCT: 42 % (ref 36.0–46.0)
Hemoglobin: 14.3 g/dL (ref 12.0–15.0)
Potassium: 4.1 mmol/L (ref 3.5–5.1)
SODIUM: 140 mmol/L (ref 135–145)
TCO2: 30 mmol/L (ref 0–100)

## 2016-03-22 LAB — APTT: APTT: 36 s (ref 24–37)

## 2016-03-22 LAB — COMPREHENSIVE METABOLIC PANEL
ALBUMIN: 4.7 g/dL (ref 3.5–5.0)
ALT: 18 U/L (ref 14–54)
ANION GAP: 7 (ref 5–15)
AST: 22 U/L (ref 15–41)
Alkaline Phosphatase: 77 U/L (ref 38–126)
BILIRUBIN TOTAL: 0.2 mg/dL — AB (ref 0.3–1.2)
BUN: 15 mg/dL (ref 6–20)
CO2: 28 mmol/L (ref 22–32)
Calcium: 9.1 mg/dL (ref 8.9–10.3)
Chloride: 102 mmol/L (ref 101–111)
Creatinine, Ser: 0.61 mg/dL (ref 0.44–1.00)
GFR calc Af Amer: >60 mL/min (ref >60)
GLUCOSE: 104 mg/dL — AB (ref 65–99)
POTASSIUM: 4 mmol/L (ref 3.5–5.1)
SODIUM: 137 mmol/L (ref 135–145)
TOTAL PROTEIN: 7.8 g/dL (ref 6.5–8.1)

## 2016-03-22 LAB — PROTIME-INR
INR: 0.92 (ref 0.00–1.49)
PROTHROMBIN TIME: 12.5 s (ref 11.6–15.2)

## 2016-03-22 LAB — CBC
HCT: 41.2 % (ref 36.0–46.0)
Hemoglobin: 13.7 g/dL (ref 12.0–15.0)
MCH: 30.2 pg (ref 26.0–34.0)
MCHC: 33.3 g/dL (ref 30.0–36.0)
MCV: 90.7 fL (ref 78.0–100.0)
PLATELETS: 269 10*3/uL (ref 150–400)
RBC: 4.54 MIL/uL (ref 3.87–5.11)
RDW: 12.6 % (ref 11.5–15.5)
WBC: 6.9 10*3/uL (ref 4.0–10.5)

## 2016-03-22 LAB — I-STAT TROPONIN, ED: TROPONIN I, POC: 0 ng/mL (ref 0.00–0.08)

## 2016-03-22 LAB — CBG MONITORING, ED: GLUCOSE-CAPILLARY: 104 mg/dL — AB (ref 65–99)

## 2016-03-22 NOTE — Discharge Instructions (Signed)
Benzodiazepine Withdrawal  °Benzodiazepines are a group of drugs that are prescribed for both short-term and long-term treatment of a variety of medical conditions. For some of these conditions, such as seizures and sudden and severe muscle spasms, they are used only for a few hours or a few days. For other conditions, such as anxiety, sleep problems, or frequent muscle spasms or to help prevent seizures, they are used for an extended period, usually weeks or months. °Benzodiazepines work by changing the way your brain functions. Normally, chemicals in your brain called neurotransmitters send messages between your brain cells. The neurotransmitter that benzodiazepines affect is called gamma-aminobutyric acid (GABA). GABA sends out messages that have a calming effect on many of the functions of your brain. Benzodiazepines make these messages stronger and increase this calming effect. °Short-term use of benzodiazepines usually does not cause problems when you stop taking the drugs. However, if you take benzodiazepines for a long time, your body can adjust to the drug and require more of it to produce the same effect (drug tolerance). Eventually, you can develop physical dependence on benzodiazepines, which is when you experience negative effects if your dosage of benzodiazepines is reduced or stopped too quickly. These negative effects are called symptoms of withdrawal. °SYMPTOMS °Symptoms of withdrawal may begin anytime within the first 10 days after you stop taking the benzodiazepine. They can last from several weeks up to a few months but usually are the worst between the first 10 to 14 days.  °The actual symptoms also vary, depending on the type of benzodiazepine you take. Possible symptoms include: °· Anxiety. °· Excitability. °· Irritability. °· Depression. °· Mood swings. °· Trouble sleeping. °· Confusion. °· Uncontrollable shaking (tremors). °· Muscle weakness. °· Seizures. °DIAGNOSIS °To diagnose  benzodiazepine withdrawal, your caregiver will examine you for certain signs, such as: °· Rapid heartbeat. °· Rapid breathing. °· Tremors. °· High blood pressure. °· Fever. °· Mood changes. °Your caregiver also may ask the following questions about your use of benzodiazepines: °· What type of benzodiazepine did you take? °· How much did you take each day? °· How long did you take the drug? °· When was the last time you took the drug? °· Do you take any other drugs? °· Have you had alcohol recently? °· Have you had a seizure recently? °· Have you lost consciousness recently? °· Have you had trouble remembering recent events? °· Have you had a recent increase in anxiety, irritability, or trouble sleeping? °A drug test also may be administered. °TREATMENT °The treatment for benzodiazepine withdrawal can vary, depending on the type and severity of your symptoms, what type of benzodiazepine you have been taking, and how long you have been taking the benzodiazepine. Sometimes it is necessary for you to be treated in a hospital, especially if you are at risk of seizures.  °Often, treatment includes a prescription for a long-acting benzodiazepine, the dosage of which is reduced slowly over a long period. This period could be several weeks or months. Eventually, your dosage will be reduced to a point that you can stop taking the drug, without experiencing withdrawal symptoms. This is called tapered withdrawal. Occasionally, minor symptoms of withdrawal continue for a few days or weeks after you have completed a tapered withdrawal. °SEEK IMMEDIATE MEDICAL CARE IF: °· You have a seizure. °· You develop a craving for drugs or alcohol. °· You begin to experience symptoms of withdrawal during your tapered withdrawal. °· You become very confused. °· You lose consciousness. °· You   have trouble breathing. °· You think about hurting yourself or someone else. °  °This information is not intended to replace advice given to you by your  health care provider. Make sure you discuss any questions you have with your health care provider. °  °Document Released: 10/03/2011 Document Revised: 11/04/2014 Document Reviewed: 04/05/2015 °[employer] Interactive Patient Education ©2016 [employer] ° °

## 2016-03-22 NOTE — ED Notes (Signed)
Pt c/o dizziness and nausea x several days, felt better today, felt sudden dizziness, ataxia, generalized weakness, and left side neck tightness onset today at 1230. Pt has been out of her klonopin prescription x 4 days. No symptoms currently. CN II-XII intact.

## 2016-03-22 NOTE — ED Provider Notes (Signed)
CSN: [id number]     Arrival date & time 03/22/16  1523 History   First MD Initiated Contact with Patient 03/22/16 1618     Chief Complaint  Patient presents with  . Near Syncope      HPI Pt c/o dizziness and nausea x several days, felt better today, felt sudden dizziness, ataxia, generalized weakness, and left side neck tightness onset today at 1230. Pt has been out of her klonopin prescription x 4 days. No symptoms currently. Past Medical History  Diagnosis Date  . Hypertension    History reviewed. No pertinent past surgical history. History reviewed. No pertinent family history. Social History  Substance Use Topics  . Smoking status: Never Smoker   . Smokeless tobacco: None  . Alcohol Use: No   OB History    No data available     Review of Systems  Neurological: Negative for seizures and headaches.  All other systems reviewed and are negative.     Allergies  Oxycodone  Home Medications   Prior to Admission medications   Medication Sig Start Date End Date Taking? Authorizing Provider  aspirin 81 MG chewable tablet Chew 81 mg by mouth 2 (two) times daily.   Yes Historical Provider, MD  clonazePAM (KLONOPIN) 0.5 MG tablet Take 0.5 mg by mouth 2 (two) times daily.  03/01/15  Yes Historical Provider, MD  losartan (COZAAR) 100 MG tablet Take 100 mg by mouth daily. 03/01/15  Yes Historical Provider, MD  polyethylene glycol (MIRALAX / GLYCOLAX) packet Take 17 g by mouth daily.   Yes Historical Provider, MD  simvastatin (ZOCOR) 40 MG tablet Take 1 tablet (40 mg total) by mouth every evening. 05/24/13  Yes Harden Mo, MD  nebivolol (BYSTOLIC) 10 MG tablet Take 1 tablet (10 mg total) by mouth 3 (three) times daily. Patient not taking: Reported on 03/13/2015 05/24/13   Harden Mo, MD   BP 170/74 mmHg  Pulse 94  Temp(Src) 97.7 F (36.5 C) (Oral)  Resp 16  SpO2 100% Physical Exam  Constitutional: She is oriented to person, place, and time. She appears well-developed and  well-nourished. No distress.  HENT:  Head: Normocephalic and atraumatic.  Eyes: Pupils are equal, round, and reactive to light.  Neck: Normal range of motion.  Cardiovascular: Normal rate and intact distal pulses.   Pulmonary/Chest: No respiratory distress.  Abdominal: Normal appearance. She exhibits no distension.  Musculoskeletal: Normal range of motion.  Neurological: She is alert and oriented to person, place, and time. She has normal strength. No cranial nerve deficit or sensory deficit. GCS eye subscore is 4. GCS verbal subscore is 5. GCS motor subscore is 6.  Skin: Skin is warm and dry. No rash noted.  Psychiatric: She has a normal mood and affect. Her behavior is normal.  Nursing note and vitals reviewed.   ED Course  Procedures (including critical care time) Labs Review Labs Reviewed  COMPREHENSIVE METABOLIC PANEL - Abnormal; Notable for the following:    Glucose, Bld 104 (*)    Total Bilirubin 0.2 (*)    All other components within normal limits  CBG MONITORING, ED - Abnormal; Notable for the following:    Glucose-Capillary 104 (*)    All other components within normal limits  PROTIME-INR  APTT  CBC  DIFFERENTIAL  I-STAT TROPOININ, ED  I-STAT CHEM 8, ED    Imaging Review Ct Head Wo Contrast  03/22/2016  CLINICAL DATA:  Dizziness and nausea for several days. TIA symptoms. EXAM: CT HEAD WITHOUT  CONTRAST TECHNIQUE: Contiguous axial images were obtained from the base of the skull through the vertex without intravenous contrast. COMPARISON:  None. FINDINGS: Sinuses/Soft tissues: Clear paranasal sinuses and mastoid air cells. Intracranial: Moderate low density in the periventricular white matter likely related to small vessel disease. Expected cerebral volume loss for age. No mass lesion, hemorrhage, hydrocephalus, acute infarct, intra-axial, or extra-axial fluid collection. IMPRESSION: 1.  No acute intracranial abnormality. 2. Small vessel ischemic change. Electronically  Signed   By: Abigail Miyamoto M.D.   On: 03/22/2016 16:32   I have personally reviewed and evaluated these images and lab results as part of my medical decision-making.   EKG Interpretation   Date/Time:  Friday Mar 22 2016 15:44:39 EDT Ventricular Rate:  82 PR Interval:  193 QRS Duration: 85 QT Interval:  392 QTC Calculation: 458 R Axis:   33 Text Interpretation:  Sinus rhythm Atrial premature complexes Anterior  infarct, old No significant change since last tracing Confirmed by Denina Rieger   MD, Xyon Lukasik [phone]) on 03/22/2016 4:17:44 PM      MDM   Final diagnoses:  Benzodiazepine withdrawal, uncomplicated (HCC)        Leonard Schwartz, MD 03/22/16 1706

## 2016-06-20 DIAGNOSIS — E782 Mixed hyperlipidemia: Secondary | ICD-10-CM | POA: Diagnosis not present

## 2016-06-20 DIAGNOSIS — I639 Cerebral infarction, unspecified: Secondary | ICD-10-CM | POA: Diagnosis not present

## 2016-06-20 DIAGNOSIS — Z0000 Encounter for general adult medical examination without abnormal findings: Secondary | ICD-10-CM | POA: Diagnosis not present

## 2016-06-20 DIAGNOSIS — Z23 Encounter for immunization: Secondary | ICD-10-CM | POA: Diagnosis not present

## 2016-06-20 DIAGNOSIS — F411 Generalized anxiety disorder: Secondary | ICD-10-CM | POA: Diagnosis not present

## 2016-06-20 DIAGNOSIS — I10 Essential (primary) hypertension: Secondary | ICD-10-CM | POA: Diagnosis not present

## 2016-06-28 ENCOUNTER — Other Ambulatory Visit: Payer: Self-pay

## 2016-07-03 ENCOUNTER — Ambulatory Visit: Payer: Medicare Other | Admitting: Podiatry

## 2016-07-03 ENCOUNTER — Ambulatory Visit (INDEPENDENT_AMBULATORY_CARE_PROVIDER_SITE_OTHER): Payer: Medicare Other | Admitting: Podiatry

## 2016-07-03 DIAGNOSIS — M79671 Pain in right foot: Secondary | ICD-10-CM

## 2016-07-03 DIAGNOSIS — L601 Onycholysis: Secondary | ICD-10-CM | POA: Diagnosis not present

## 2016-07-03 DIAGNOSIS — B351 Tinea unguium: Secondary | ICD-10-CM

## 2016-07-03 NOTE — Progress Notes (Signed)
Patient ID: Amy [name], female   DOB: 1933-04-21, 82 y.o.   MRN: [id number] Subjective: Patient comes in today with a concern for detached partially detached nail to the right hallux. Patient denies any trauma. Patient noticed it several months ago.   Objective: Physical Exam General: The patient is alert and oriented x3 in no acute distress.  Dermatology: Skin is warm, dry and supple bilateral lower extremities. Negative for open lesions or macerations.  Vascular: Palpable pedal pulses bilaterally. No edema or erythema noted. Capillary refill within normal limits.  Neurological: Epicritic and protective threshold grossly intact bilaterally.   Musculoskeletal Exam: Range of motion within normal limits to all pedal and ankle joints bilateral. Muscle strength 5/5 in all groups bilateral.    Assessment: #1 onycholysis great toenail right #2 pain in toes-right great toe. Problem List Items Addressed This Visit    None    Visit Diagnoses   None.       Plan of Care:  #1 Patient was evaluated. #2 Debridement of nails were performed to the right great toenail using a nail nipper, and smoothed with a rotary bur. #3 informed the patient not to be concerned with the partially detached nail. Simple observation is required at the moment. #4 patient history return to the clinic on an as-needed basis     Dr. Edrick Kins, [place]

## 2016-07-03 NOTE — Progress Notes (Signed)
   Subjective:    Patient ID: Amy [name], female    DOB: Jul 25, 1933, 82 y.o.   MRN: [id number]  HPI    Review of Systems  Psychiatric/Behavioral: The patient is nervous/anxious.        Objective:   Physical Exam        Assessment & Plan:

## 2016-09-12 DIAGNOSIS — D649 Anemia, unspecified: Secondary | ICD-10-CM | POA: Diagnosis not present

## 2016-09-12 DIAGNOSIS — Z6825 Body mass index (BMI) 25.0-25.9, adult: Secondary | ICD-10-CM | POA: Diagnosis not present

## 2016-09-12 DIAGNOSIS — F411 Generalized anxiety disorder: Secondary | ICD-10-CM | POA: Diagnosis not present

## 2016-09-12 DIAGNOSIS — I10 Essential (primary) hypertension: Secondary | ICD-10-CM | POA: Diagnosis not present

## 2016-09-12 DIAGNOSIS — R5383 Other fatigue: Secondary | ICD-10-CM | POA: Diagnosis not present

## 2016-09-26 ENCOUNTER — Other Ambulatory Visit: Payer: Self-pay | Admitting: Physician Assistant

## 2016-09-26 ENCOUNTER — Ambulatory Visit (INDEPENDENT_AMBULATORY_CARE_PROVIDER_SITE_OTHER): Payer: Medicare Other | Admitting: Physician Assistant

## 2016-09-26 VITALS — BP 172/60 | HR 90 | Temp 97.9°F | Resp 18 | Ht 60.0 in | Wt 134.0 lb

## 2016-09-26 DIAGNOSIS — I10 Essential (primary) hypertension: Secondary | ICD-10-CM

## 2016-09-26 LAB — COMPLETE METABOLIC PANEL WITH GFR
ALT: 11 U/L (ref 6–29)
AST: 16 U/L (ref 10–35)
Albumin: 4.4 g/dL (ref 3.6–5.1)
Alkaline Phosphatase: 92 U/L (ref 33–130)
BILIRUBIN TOTAL: 0.3 mg/dL (ref 0.2–1.2)
BUN: 13 mg/dL (ref 7–25)
CO2: 26 mmol/L (ref 20–31)
CREATININE: 0.59 mg/dL — AB (ref 0.60–0.88)
Calcium: 9.4 mg/dL (ref 8.6–10.4)
Chloride: 100 mmol/L (ref 98–110)
GFR, Est Non African American: 86 mL/min (ref >=60)
GLUCOSE: 92 mg/dL (ref 65–99)
Potassium: 4.2 mmol/L (ref 3.5–5.3)
SODIUM: 138 mmol/L (ref 135–146)
TOTAL PROTEIN: 7.6 g/dL (ref 6.1–8.1)

## 2016-09-26 LAB — POC MICROSCOPIC URINALYSIS (UMFC): Mucus: ABSENT

## 2016-09-26 LAB — POCT URINALYSIS DIP (MANUAL ENTRY)
BILIRUBIN UA: NEGATIVE
Bilirubin, UA: NEGATIVE
Glucose, UA: NEGATIVE
LEUKOCYTES UA: NEGATIVE
Nitrite, UA: NEGATIVE
PROTEIN UA: NEGATIVE
Spec Grav, UA: 1.01
Urobilinogen, UA: 0.2
pH, UA: 7

## 2016-09-26 MED ORDER — HYDROCHLOROTHIAZIDE 12.5 MG PO CAPS: 12.5000 mg | ORAL_CAPSULE | Freq: Every day | ORAL | 0 refills | Status: DC

## 2016-09-26 NOTE — Progress Notes (Signed)
MRN: [id number] DOB: Mar 23, 1933  Subjective:   Amy [name] is a 82 y.o. female presenting for follow up on hypertension.  She has had a diagnosis of htn for 20 years. Her PCP is Dr. Antony Odea but she does not accept her plan anymore. She last saw her 3-4 weeks ago and her bp was slightly elevated then.   Currently managed with losartan 100mg  daily for the past three years. Patient is checking blood pressure at home the past few days, range is [id number] systolic. Reports more noticeable buzzing in ears and mild lightheadedness when it is really high. Denies dizziness, chronic headache, double vision, chest pain, shortness of breath, heart racing, palpitations, nausea, vomiting, abdominal pain, hematuria, lower leg swelling. Denies smoking or alcohol use. Denies any other aggravating or relieving factors, no other questions or concerns.  Diet: Eats chicken and ground [place] and brown and beans. She adds a little salt to meals. She drinks grape juice, water, and green/black tea.   Amy [name] has a current medication list which includes the following prescription(s): aspirin, clonazepam, losartan, polyethylene glycol, simvastatin, and hydrochlorothiazide. Also is allergic to oxycodone.  Amy [name]  has a past medical history of Hyperlipidemia and Hypertension. Also  has a past surgical history that includes Appendectomy.   Objective:   Vitals: BP (!) 172/60 (BP Location: Right Arm, Patient Position: Sitting, Cuff Size: Normal)   Pulse 90   Temp 97.9 F (36.6 C) (Oral)   Resp 18   Ht 5' (1.524 m)   Wt 134 lb (60.8 kg)   SpO2 97%   BMI 26.17 kg/m   Physical Exam  Constitutional: She is oriented to person, place, and time. She appears well-developed and well-nourished.  HENT:  Head: Normocephalic and atraumatic.  Eyes: Conjunctivae are normal. Pupils are equal, round, and reactive to light.  Fundoscopic exam:      The right eye shows red reflex.       The left eye shows red reflex.  Neck:  Normal range of motion.  Cardiovascular: Normal rate, regular rhythm, normal heart sounds and intact distal pulses.   Pulmonary/Chest: Effort normal.  Musculoskeletal:       Right lower leg: She exhibits no swelling.       Left lower leg: She exhibits no swelling.  Neurological: She is alert and oriented to person, place, and time. She displays tremor.  Skin: Skin is warm and dry.  Psychiatric: She has a normal mood and affect.  Vitals reviewed.    BP Readings from Last 3 Encounters:  09/26/16 (!) 172/60  03/22/16 185/80  03/13/15 (!) 190/52   Results for orders placed or performed in visit on 09/26/16 (from the past 24 hour(s))  POCT urinalysis dipstick     Status: Abnormal   Collection Time: 09/26/16 11:22 AM  Result Value Ref Range   Color, UA yellow yellow   Clarity, UA clear clear   Glucose, UA negative negative   Bilirubin, UA negative negative   Ketones, POC UA negative negative   Spec Grav, UA 1.010    Blood, UA trace-intact (A) negative   pH, UA 7.0    Protein Ur, POC negative negative   Urobilinogen, UA 0.2    Nitrite, UA Negative Negative   Leukocytes, UA Negative Negative  POCT Microscopic Urinalysis ([facility])     Status: None   Collection Time: 09/26/16 11:31 AM  Result Value Ref Range   WBC,UR,HPF,POC None None WBC/hpf   RBC,UR,HPF,POC None None RBC/hpf   Bacteria None None,  Too numerous to count   Mucus Absent Absent   Epithelial Cells, UR Per Microscopy None None, Too numerous to count cells/hpf    Assessment and Plan :  1. Essential hypertension -Uncontrolled, will ad HCTZ 12.5. Pt to continue monitoring bp outside of office and bring those readings to next visit. She was instructed to return in 4 days for repeat bp and possible increase in HCTZ. Consider dual pill of losartan and hctz for future use.  - POCT Microscopic Urinalysis ([facility]) - POCT urinalysis dipstick - COMPLETE METABOLIC PANEL WITH GFR - TSH - hydrochlorothiazide (MICROZIDE) 12.5 MG  capsule; Take 1 capsule (12.5 mg total) by mouth daily.  Dispense: 10 capsule; Refill: 0  Amy Delaine, PA-C  [facility] 09/26/2016 12:39 PM

## 2016-09-26 NOTE — Patient Instructions (Addendum)
Start taking hctz along with losartan.    Keep checking your bp daily. Your goal is <140/90 and >100/60. Follow up next week for repeat blood pressure and potential increase in medication.   Thank you for letting me participate in your health and well being.    IF you received an x-ray today, you will receive an invoice from [facility]. Please contact [facility] at [phone] with questions or concerns regarding your invoice.   IF you received labwork today, you will receive an invoice from [employer]. Please contact [facility] at [phone] with questions or concerns regarding your invoice.   Our billing staff will not be able to assist you with questions regarding bills from these companies.  You will be contacted with the lab results as soon as they are available. The fastest way to get your results is to activate your My Chart account. Instructions are located on the last page of this paperwork. If you have not heard from [place] regarding the results in 2 weeks, please contact this office.

## 2016-09-27 ENCOUNTER — Telehealth: Payer: Self-pay

## 2016-09-27 LAB — TSH: TSH: 2.39 mIU/L

## 2016-09-27 NOTE — Telephone Encounter (Signed)
Pt contacted. She was nervous about the side effects of HCTZ. I encouraged her that all medications have side effects and that her bp really does need to be controlled due to the elevated readings in [id number] systolic. Pt understands and agrees to trying the medication.

## 2016-09-27 NOTE — Telephone Encounter (Signed)
Pt is needing to talk with Amy [name] about her blood pressure meds -she is afraid of taking the meds due to side effects   Best number [phone]

## 2016-09-30 ENCOUNTER — Ambulatory Visit (INDEPENDENT_AMBULATORY_CARE_PROVIDER_SITE_OTHER): Payer: Medicare Other | Admitting: Physician Assistant

## 2016-09-30 VITALS — BP 162/70 | HR 82 | Temp 97.6°F | Resp 16 | Ht 60.0 in | Wt 131.4 lb

## 2016-09-30 DIAGNOSIS — I10 Essential (primary) hypertension: Secondary | ICD-10-CM | POA: Diagnosis not present

## 2016-09-30 MED ORDER — HYDROCHLOROTHIAZIDE 25 MG PO TABS: 25.0000 mg | ORAL_TABLET | Freq: Every day | ORAL | 0 refills | Status: DC

## 2016-09-30 NOTE — Progress Notes (Signed)
    MRN: [id number] DOB: [birth year]  Subjective:   Amy [name] is a 82 y.o. female presenting for follow up on Hypertension. Was intially seen on 09/26/16 with elevated blood pressure readings in [id number] systolic. HCTZ 12.5 was added to her losartan 100mg  daily. She was nervous about taking the medication so she did not start taking it unitl 09/27/16.    Patient is checking blood pressure at home, range is 0000000 systolic. Reports no side effects from the medicaiton. Denies lightheadedness, dizziness, chronic headache, double vision, chest pain, shortness of breath, heart racing, palpitations, nausea, vomiting, abdominal pain, hematuria, lower leg swelling. Denies any other aggravating or relieving factors, no other questions or concerns.    Amy [name] has a current medication list which includes the following prescription(s): aspirin, clonazepam, hydrochlorothiazide, losartan, polyethylene glycol, and simvastatin. Also is allergic to oxycodone.  Amy [name]  has a past medical history of Hyperlipidemia; Hypertension; and Rheumatic heart disease. Also  has a past surgical history that includes Appendectomy.   Objective:   Vitals: BP (!) 162/70   Pulse 82   Temp 97.6 F (36.4 C) (Oral)   Resp 16   Ht 5' (1.524 m)   Wt 131 lb 6.4 oz (59.6 kg)   SpO2 96%   BMI 25.66 kg/m   Physical Exam  Constitutional: She is oriented to person, place, and time. She appears well-developed and well-nourished.  HENT:  Head: Normocephalic and atraumatic.  Eyes: Conjunctivae are normal.  Neck: Normal range of motion.  Cardiovascular: Normal rate, regular rhythm, normal heart sounds and intact distal pulses.   Pulmonary/Chest: Effort normal.  Neurological: She is alert and oriented to person, place, and time.  Skin: Skin is warm and dry.  Psychiatric: She has a normal mood and affect.  Vitals reviewed.  No results found for this or any previous visit (from the past 24 hour(s)).   BP Readings from Last 3  Encounters:  09/30/16 (!) 162/70  09/26/16 (!) 172/60  03/22/16 185/80   Assessment and Plan :  1. Essential hypertension Pt instructed to take hctz 12.5 daily for the next five days and then start the 25mg  tablets along with losartan 100mg  daily. Continue to monitor bp at home. Return in 2 weeks for bp reevaluation and for discussion of anxiety treatment options.  - hydrochlorothiazide (HYDRODIURIL) 25 MG tablet; Take 1 tablet (25 mg total) by mouth daily.  Dispense: 15 tablet; Refill: 0  Tenna Delaine, PA-C  [facility] 09/30/2016 10:41 AM

## 2016-09-30 NOTE — Patient Instructions (Addendum)
Continue taking the 12.5 until you run out. Then start the 25mg  tablet along with the losartan. Follow up in 2 weeks for blood pressure evaluation and we will also talk about anxiety at this time.    IF you received an x-ray today, you will receive an invoice from [facility]. Please contact [facility] at [phone] with questions or concerns regarding your invoice.   IF you received labwork today, you will receive an invoice from [employer]. Please contact [facility] at [phone] with questions or concerns regarding your invoice.   Our billing staff will not be able to assist you with questions regarding bills from these companies.  You will be contacted with the lab results as soon as they are available. The fastest way to get your results is to activate your My Chart account. Instructions are located on the last page of this paperwork. If you have not heard from [place] regarding the results in 2 weeks, please contact this office.

## 2016-10-01 ENCOUNTER — Ambulatory Visit: Payer: Medicare Other

## 2016-10-02 ENCOUNTER — Telehealth: Payer: Self-pay

## 2016-10-02 NOTE — Telephone Encounter (Signed)
I have contacted both patient and pharmacy. The pharmacy was requesting a 90 day prescription but after talking with me will approve the 15 day prescription this time.

## 2016-10-02 NOTE — Telephone Encounter (Signed)
Patient is calling because she's having trouble getting her hydrochlorthiazide at the pharmacy because they want additional information. Patient is confused about what the pharmacy is asking for.   She also wants to speak with [place] about her her blood pressure and if she should continue taking the medication. Please advise!   [phone]

## 2016-10-16 ENCOUNTER — Encounter: Payer: Self-pay | Admitting: Physician Assistant

## 2016-10-16 ENCOUNTER — Ambulatory Visit (INDEPENDENT_AMBULATORY_CARE_PROVIDER_SITE_OTHER): Payer: Medicare Other | Admitting: Physician Assistant

## 2016-10-16 ENCOUNTER — Other Ambulatory Visit: Payer: Self-pay | Admitting: Physician Assistant

## 2016-10-16 VITALS — BP 138/68 | HR 82 | Temp 97.7°F | Resp 16 | Ht 60.0 in | Wt 132.6 lb

## 2016-10-16 DIAGNOSIS — F411 Generalized anxiety disorder: Secondary | ICD-10-CM

## 2016-10-16 DIAGNOSIS — F329 Major depressive disorder, single episode, unspecified: Secondary | ICD-10-CM | POA: Diagnosis not present

## 2016-10-16 DIAGNOSIS — I10 Essential (primary) hypertension: Secondary | ICD-10-CM | POA: Diagnosis not present

## 2016-10-16 DIAGNOSIS — R4589 Other symptoms and signs involving emotional state: Secondary | ICD-10-CM

## 2016-10-16 MED ORDER — CITALOPRAM HYDROBROMIDE 20 MG PO TABS: 10.0000 mg | ORAL_TABLET | Freq: Every day | ORAL | 0 refills | Status: DC

## 2016-10-16 MED ORDER — HYDROCHLOROTHIAZIDE 25 MG PO TABS: 25.0000 mg | ORAL_TABLET | Freq: Every day | ORAL | 1 refills | Status: DC

## 2016-10-16 NOTE — Patient Instructions (Addendum)
For anxiety: Start taking 1/2 tablet of klonipin daily x 7 days. Then decrease to 1/4 tablet daily x 7 days. Start taking 10mg  celexa daily (half a tablet of what I prescribed you). Take this at night time, if it makes you really awake, take in the morning. Take with food to decrease any side effects of GI upset. Follow up in 2 weeks for reevaluation.   For bp: Continue hctz 25mg  and losartan 100mg  daily. Check bp every few days and write it down. Notify office if it is ever >180/100.   Thank you for letting me participate in your health and well being.    IF you received an x-ray today, you will receive an invoice from [facility]. Please contact [facility] at [phone] with questions or concerns regarding your invoice.   IF you received labwork today, you will receive an invoice from [facility]. Please contact [facility] at [phone] with questions or concerns regarding your invoice.   Our billing staff will not be able to assist you with questions regarding bills from these companies.  You will be contacted with the lab results as soon as they are available. The fastest way to get your results is to activate your My Chart account. Instructions are located on the last page of this paperwork. If you have not heard from [place] regarding the results in 2 weeks, please contact this office.

## 2016-10-16 NOTE — Progress Notes (Signed)
MRN: [id number] DOB: [birth year]  Subjective:   Amy [name] is a 82 y.o. female presenting for follow up on Hypertension.  Was intially seen on 09/26/16 with elevated blood pressure readings in [id number] systolic. HCTZ 12.5 was added to her losartan 100mg  daily. She followed up on 09/30/16 and bp was still elevated in 160s. HCTZ was increased to 25mg  daily. Patient is checking blood pressure at home, range is [id number] systolic. She has had a few readings in 150s-160s. 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 Denies lightheadedness, dizziness, chronic headache, double vision, chest pain, shortness of breath, heart racing, palpitations, nausea, vomiting, abdominal pain, hematuria, lower leg swelling.   Notes she is very stressed today because she got a scam call and this is the 3rd in the last week and she was completley torn up about it just prior to arriving to our office.   Pt would also like treatment for her anxiety and depression. She has been dealing with this issue for 2 years since her husband was diagnosed with Alzheimers. She is the sole care provider for him and this is really hard for her. Her previous PCP placed her on klonopin 0.5mg  BID for anxiety and she has been taking it for about 2 years. However she does like the way it makes her feel and would prefer trying a different agent. She has only been taking 0.25 in the moirning and 0.25 at night for most of the time as the 0.5mg  BID was too much for her.     Amy [name] has a current medication list which includes the following prescription(s): aspirin, clonazepam, hydrochlorothiazide, losartan, polyethylene glycol, and simvastatin. Also is allergic to oxycodone.  Amy [name]  has a past medical history of Hyperlipidemia; Hypertension; and Rheumatic heart  disease. Also  has a past surgical history that includes Appendectomy.   Objective:   Vitals: BP 138/68   Pulse 82   Temp 97.7 F (36.5 C) (Oral)   Resp 16   Ht 5' (1.524 m)   Wt 132 lb 9.6 oz (60.1 kg)   SpO2 96%   BMI 25.90 kg/m   Physical Exam  Constitutional: She is oriented to person, place, and time. She appears well-developed and well-nourished.  HENT:  Head: Normocephalic and atraumatic.  Eyes: Conjunctivae are normal.  Neck: Normal range of motion.  Cardiovascular: Normal rate, regular rhythm, normal heart sounds and intact distal pulses.   Pulmonary/Chest: Effort normal and breath sounds normal.  Musculoskeletal:       Right lower leg: She exhibits no swelling.       Left lower leg: She exhibits no swelling.  Neurological: She is alert and oriented to person, place, and time.  Skin: Skin is warm and dry.  Psychiatric: She has a normal mood and affect.  Vitals reviewed.  BP Readings from Last 3 Encounters:  10/16/16 138/68  09/30/16 (!) 162/70  09/26/16 (!) 172/60   No results found for this or any previous visit (from the past 24 hour(s)).  Assessment and Plan :  1. Essential hypertension Instructed to continue HCTZ 25mg  and losartan 100mg  daily. Continue to document bp readings. We calibrated her bp machine in office and it appears to run slightly higher than our manual readings. Bring recordings into next office  visit. If uncontrolled at this time, may consider adding Amlodipine 2.5mg  as an option - hydrochlorothiazide (HYDRODIURIL) 25 MG tablet; Take 1 tablet (25 mg total) by mouth daily.  Dispense: 90 tablet; Refill: 1  2. Anxiety state -Will taper pt off klonopin. Instructed to take 0.25 daily x 1 week then 0.125 x 1 week. Start Celexa 10mg  daily. Will follow up in 2 weeks and may need to increase to Celexa 20mg  at this time.  - citalopram (CELEXA) 20 MG tablet; Take 0.5 tablets (10 mg total) by mouth daily.  Dispense: 30 tablet; Refill: 0  3. Depressed  mood - citalopram (CELEXA) 20 MG tablet; Take 0.5 tablets (10 mg total) by mouth daily.  Dispense: 30 tablet; Refill: 0  A total of 25 minutes was spent in the room with the patient, greater than 50% of which was spent in discussion of treatment plan for anxiety.   Tenna Delaine, PA-C  [facility] 10/16/2016 4:27 PM

## 2016-10-24 ENCOUNTER — Ambulatory Visit (INDEPENDENT_AMBULATORY_CARE_PROVIDER_SITE_OTHER): Payer: Medicare Other | Admitting: Physician Assistant

## 2016-10-24 VITALS — BP 164/70 | HR 72 | Temp 98.0°F | Resp 16 | Ht 60.0 in | Wt 130.6 lb

## 2016-10-24 DIAGNOSIS — R030 Elevated blood-pressure reading, without diagnosis of hypertension: Secondary | ICD-10-CM | POA: Diagnosis not present

## 2016-10-24 DIAGNOSIS — R42 Dizziness and giddiness: Secondary | ICD-10-CM | POA: Diagnosis not present

## 2016-10-24 DIAGNOSIS — F411 Generalized anxiety disorder: Secondary | ICD-10-CM

## 2016-10-24 DIAGNOSIS — N390 Urinary tract infection, site not specified: Secondary | ICD-10-CM | POA: Diagnosis not present

## 2016-10-24 DIAGNOSIS — R34 Anuria and oliguria: Secondary | ICD-10-CM | POA: Diagnosis not present

## 2016-10-24 DIAGNOSIS — R110 Nausea: Secondary | ICD-10-CM

## 2016-10-24 LAB — POCT CBC
Granulocyte percent: 70.3 %G (ref 37–80)
HCT, POC: 38.5 % (ref 37.7–47.9)
Hemoglobin: 13.4 g/dL (ref 12.2–16.2)
LYMPH, POC: 1.8 (ref 0.6–3.4)
MCH, POC: 30.3 pg (ref 27–31.2)
MCHC: 34.9 g/dL (ref 31.8–35.4)
MCV: 86.6 fL (ref 80–97)
MID (cbc): 0.5 (ref 0–0.9)
MPV: 6.4 fL (ref 0–99.8)
PLATELET COUNT, POC: 261 10*3/uL (ref 142–424)
POC Granulocyte: 5.6 (ref 2–6.9)
POC LYMPH %: 23.1 % (ref 10–50)
POC MID %: 6.6 %M (ref 0–12)
RBC: 4.44 M/uL (ref 4.04–5.48)
RDW, POC: 12.6 %
WBC: 8 10*3/uL (ref 4.6–10.2)

## 2016-10-24 LAB — POCT URINALYSIS DIP (MANUAL ENTRY)
BILIRUBIN UA: NEGATIVE
BILIRUBIN UA: NEGATIVE
Glucose, UA: NEGATIVE
Nitrite, UA: NEGATIVE
PH UA: 7
Protein Ur, POC: NEGATIVE
SPEC GRAV UA: 1.015
Urobilinogen, UA: 1

## 2016-10-24 LAB — POC MICROSCOPIC URINALYSIS (UMFC): MUCUS RE: ABSENT

## 2016-10-24 LAB — GLUCOSE, POCT (MANUAL RESULT ENTRY): POC Glucose: 113 mg/dl — AB (ref 70–99)

## 2016-10-24 MED ORDER — CEPHALEXIN 500 MG PO CAPS: 500.0000 mg | ORAL_CAPSULE | Freq: Two times a day (BID) | ORAL | 0 refills | Status: AC

## 2016-10-24 NOTE — Progress Notes (Signed)
Amy [name]  MRN: [id number] DOB: 09-02-1933  Subjective:  Amy [name] is a 82 y.o. female seen in office today for a chief complaint of nausea, dizziness, slight headache, intermittent tremors, constipation (last BM was yesterday), intermittent blurred vision x 5 days. Pt was started on celexa 71m daily 7 days ago at night time and felt kind of funny but thought she would get used to it. However the symptoms have progressed and seem to have gotten worse yesterday.  Denies numbness, tingling, slurred speech, arm pain or weakness. Of note, pt is also decreasing her klonipin as she was on 0.25 BID. She has decreased it over the past week to 0.25 once day. She did not take her blood pressure medication this morning. Was started on HCTZ one month ago and thus far has tolerated the medication well.    Of note, pt's daughter in law is in the room and asked to speak with me privately. She states that her mother in law does this each time she starts a medication as she will go read the medication side effects and feel as if she has all of them herself.    Review of Systems  Constitutional: Positive for appetite change (decreased ) and chills. Negative for diaphoresis, fatigue and fever.  HENT: Negative for congestion, sinus pressure and sore throat.   Respiratory: Negative for chest tightness and shortness of breath.   Cardiovascular: Negative for chest pain, palpitations and leg swelling.  Gastrointestinal: Negative for abdominal pain and vomiting.  Genitourinary: Positive for decreased urine volume. Negative for dysuria, flank pain and frequency.  Neurological: Negative for facial asymmetry and speech difficulty.   There are no active problems to display for this patient.  Current Outpatient Prescriptions on File Prior to Visit  Medication Sig Dispense Refill  . aspirin 81 MG chewable tablet Chew 81 mg by mouth 2 (two) times daily.    . citalopram (CELEXA) 20 MG tablet Take 0.5 tablets (10 mg  total) by mouth daily. 30 tablet 0  . clonazePAM (KLONOPIN) 0.5 MG tablet Take 0.5 mg by mouth 2 (two) times daily.   0  . hydrochlorothiazide (HYDRODIURIL) 25 MG tablet Take 1 tablet (25 mg total) by mouth daily. 90 tablet 1  . losartan (COZAAR) 100 MG tablet Take 100 mg by mouth daily.  0  . polyethylene glycol (MIRALAX / GLYCOLAX) packet Take 17 g by mouth daily.    . simvastatin (ZOCOR) 40 MG tablet Take 1 tablet (40 mg total) by mouth every evening. 30 tablet 2   No current facility-administered medications on file prior to visit.     Allergies  Allergen Reactions  . Oxycodone Nausea And Vomiting and Other (See Comments)    Intolerance--"its like I was floating up on the ceiling"      Objective:  BP (!) 164/70   Pulse 72   Temp 98 F (36.7 C) (Oral)   Resp 16   Ht 5' (1.524 m)   Wt 130 lb 9.6 oz (59.2 kg)   SpO2 98%   BMI 25.51 kg/m   Physical Exam  Constitutional: She is oriented to person, place, and time.  HENT:  Head: Normocephalic and atraumatic.  Mouth/Throat: Uvula is midline, oropharynx is clear and moist and mucous membranes are normal.  Eyes: Right eye visual fields normal and left eye visual fields normal. Conjunctivae and EOM are normal. Pupils are equal, round, and reactive to light.  Cardiovascular: Normal rate, regular rhythm, normal heart sounds and intact distal  pulses.   Pulmonary/Chest: Effort normal and breath sounds normal.  Abdominal: Soft. Normal appearance and bowel sounds are normal. There is no tenderness.  Lymphadenopathy:       Head (right side): No submental, no submandibular, no tonsillar, no preauricular, no posterior auricular and no occipital adenopathy present.       Head (left side): No submental, no submandibular, no tonsillar, no preauricular, no posterior auricular and no occipital adenopathy present.    She has no cervical adenopathy.       Right: No supraclavicular adenopathy present.       Left: No supraclavicular adenopathy  present.  Neurological: She is alert and oriented to person, place, and time. She has normal sensation, normal strength, normal reflexes and intact cranial nerves. She displays tremor (of head, baseline for patient). She displays facial symmetry. She has a normal Cerebellar Exam, a normal Heel to [employer], a normal Romberg Test and a normal Tandem Gait Test. She shows no pronator drift. Gait normal. Gait normal.  Psychiatric: Her mood appears anxious.    BP Readings from Last 3 Encounters:  10/24/16 (!) 164/70  10/16/16 138/68  09/30/16 (!) 162/70    Results for orders placed or performed in visit on 10/24/16 (from the past 24 hour(s))  POCT CBC     Status: None   Collection Time: 10/24/16 10:43 AM  Result Value Ref Range   WBC 8.0 4.6 - 10.2 K/uL   Lymph, poc 1.8 0.6 - 3.4   POC LYMPH PERCENT 23.1 10 - 50 %L   MID (cbc) 0.5 0 - 0.9   POC MID % 6.6 0 - 12 %M   POC Granulocyte 5.6 2 - 6.9   Granulocyte percent 70.3 37 - 80 %G   RBC 4.44 4.04 - 5.48 M/uL   Hemoglobin 13.4 12.2 - 16.2 g/dL   HCT, POC 38.5 37.7 - 47.9 %   MCV 86.6 80 - 97 fL   MCH, POC 30.3 27 - 31.2 pg   MCHC 34.9 31.8 - 35.4 g/dL   RDW, POC 12.6 %   Platelet Count, POC 261 142 - 424 K/uL   MPV 6.4 0 - 99.8 fL  POCT glucose (manual entry)     Status: Abnormal   Collection Time: 10/24/16 10:44 AM  Result Value Ref Range   POC Glucose 113 (A) 70 - 99 mg/dl  POCT urinalysis dipstick     Status: Abnormal   Collection Time: 10/24/16 11:06 AM  Result Value Ref Range   Color, UA yellow yellow   Clarity, UA clear clear   Glucose, UA negative negative   Bilirubin, UA negative negative   Ketones, POC UA negative negative   Spec Grav, UA 1.015    Blood, UA trace-intact (A) negative   pH, UA 7.0    Protein Ur, POC negative negative   Urobilinogen, UA 1.0    Nitrite, UA Negative Negative   Leukocytes, UA small (1+) (A) Negative  POCT Microscopic Urinalysis ([facility])     Status: Abnormal   Collection Time: 10/24/16  11:13 AM  Result Value Ref Range   WBC,UR,HPF,POC Moderate (A) None WBC/hpf   RBC,UR,HPF,POC None None RBC/hpf   Bacteria Moderate (A) None, Too numerous to count   Mucus Absent Absent   Epithelial Cells, UR Per Microscopy Few (A) None, Too numerous to count cells/hpf     Orthostatic VS for the past 24 hrs:  BP- Lying Pulse- Lying BP- Sitting Pulse- Sitting  10/24/16 1102 160/62 66 188/62  68  10/24/16 1038 160/62 66 188/62 68  Standing: BP 156/64 and HR 69  Assessment and Plan :  1. Nausea without vomiting Labs pending - POCT CBC - POCT glucose (manual entry) - Orthostatic vital signs - CMP14+EGFR  2. Dizziness - POCT glucose (manual entry)  3. Decreased urine volume - POCT Microscopic Urinalysis ([facility]) - POCT urinalysis dipstick  4. Urinary tract infection without hematuria, site unspecified -Labs pending - Urine culture - cephALEXin (KEFLEX) 500 MG capsule; Take 1 capsule (500 mg total) by mouth 2 (two) times daily.  Dispense: 14 capsule; Refill: 0  5. Anxiety state -Physical exam is reassuring, pt's symptoms likely due to recent initiation of celexa 19m. Pt does tend to go home and read about medication side effects when she starts them.. Pt instructed to decrease to 537mdaily x one week and increase klonopin to 0.255maily to help control her anxiety and decrease the severity of side effects. Informed her that she will typically have symptoms for about 2 weeks but if she can make it past this point they tend to subside. If her symptoms are still persisting at follow up visit in one week, we can talk about other medication options for anxiety.   6. Elevated blood pressure reading Pt did not take bp medication this morning. Instructed to take bp medication when she gets home. Will follow up in 1 week.   -Pt instructed to return to clinic or the ED sooner if symptoms worsen   BriTenna Delaine-C  [facility] 10/24/2016  3:54 PM

## 2016-10-24 NOTE — Patient Instructions (Addendum)
For anxiety: I would like you to decrease the celexa to 5mg  (which is 1/4 of a tablet) daily as these side effects may be due to celexa. I would like you to increase klonopin to 0.25 daily. Follow up in one week to see how this is going for you.   For UTI: I would like you to take keflex 500mg  twice a day for 7 days. Continue to drink lots of water. Take keflex with food.   For HTN: take your blood pressure medication as soon as you get home.   Return to our clinic or the ER sooner if any of your symptoms worsen.   Acute Urinary Retention, Female Urinary retention means you are unable to pee completely or at all (empty your bladder). Follow these instructions at home:  Drink enough fluids to keep your pee (urine) clear or pale yellow.  If you are sent home with a tube that drains the bladder (catheter), there will be a drainage bag attached to it. There are two types of bags. One is big that you can wear at night without having to empty it. One is smaller and needs to be emptied more often.  Keep the drainage bag emptied.  Keep the drainage bag lower than the tube.  Only take medicine as told by your doctor. Contact a doctor if:  You have a low-grade fever.  You have spasms or you are leaking pee when you have spasms. Get help right away if:  You have chills or a fever.  Your catheter stops draining pee.  Your catheter falls out.  You have increased bleeding that does not stop after you have rested and increased the amount of fluids you had been drinking. This information is not intended to replace advice given to you by your health care provider. Make sure you discuss any questions you have with your health care provider. Document Released: 04/01/2008 Document Revised: 03/21/2016 Document Reviewed: 03/25/2013 Elsevier Interactive Patient Education  2017 [employer].     IF you received an x-ray today, you will receive an invoice from [facility]. Please contact  [facility] at [phone] with questions or concerns regarding your invoice.   IF you received labwork today, you will receive an invoice from [facility]. Please contact [facility] at [phone] with questions or concerns regarding your invoice.   Our billing staff will not be able to assist you with questions regarding bills from these companies.  You will be contacted with the lab results as soon as they are available. The fastest way to get your results is to activate your My Chart account. Instructions are located on the last page of this paperwork. If you have not heard from [place] regarding the results in 2 weeks, please contact this office.

## 2016-10-25 LAB — CMP14+EGFR
A/G RATIO: 1.7 (ref 1.2–2.2)
ALT: 15 IU/L (ref 0–32)
AST: 20 IU/L (ref 0–40)
Albumin: 4.8 g/dL — ABNORMAL HIGH (ref 3.5–4.7)
Alkaline Phosphatase: 79 IU/L (ref 39–117)
BUN / CREAT RATIO: 21 (ref 12–28)
BUN: 14 mg/dL (ref 8–27)
Bilirubin Total: 0.5 mg/dL (ref 0.0–1.2)
CALCIUM: 9.4 mg/dL (ref 8.7–10.3)
CO2: 28 mmol/L (ref 18–29)
Chloride: 89 mmol/L — ABNORMAL LOW (ref 96–106)
Creatinine, Ser: 0.66 mg/dL (ref 0.57–1.00)
GFR, EST AFRICAN AMERICAN: 96 mL/min/{1.73_m2} (ref >59)
GFR, EST NON AFRICAN AMERICAN: 83 mL/min/{1.73_m2} (ref >59)
GLOBULIN, TOTAL: 2.8 g/dL (ref 1.5–4.5)
Glucose: 111 mg/dL — ABNORMAL HIGH (ref 65–99)
POTASSIUM: 4.1 mmol/L (ref 3.5–5.2)
SODIUM: 133 mmol/L — AB (ref 134–144)
TOTAL PROTEIN: 7.6 g/dL (ref 6.0–8.5)

## 2016-10-26 ENCOUNTER — Ambulatory Visit (INDEPENDENT_AMBULATORY_CARE_PROVIDER_SITE_OTHER): Payer: Medicare Other | Admitting: Physician Assistant

## 2016-10-26 VITALS — BP 132/68 | HR 72 | Temp 97.5°F | Resp 16 | Ht 60.0 in | Wt 130.8 lb

## 2016-10-26 DIAGNOSIS — H9313 Tinnitus, bilateral: Secondary | ICD-10-CM

## 2016-10-26 DIAGNOSIS — R4582 Worries: Secondary | ICD-10-CM

## 2016-10-26 DIAGNOSIS — R110 Nausea: Secondary | ICD-10-CM

## 2016-10-26 DIAGNOSIS — T50905D Adverse effect of unspecified drugs, medicaments and biological substances, subsequent encounter: Secondary | ICD-10-CM

## 2016-10-26 DIAGNOSIS — T887XXD Unspecified adverse effect of drug or medicament, subsequent encounter: Secondary | ICD-10-CM

## 2016-10-26 DIAGNOSIS — R4589 Other symptoms and signs involving emotional state: Secondary | ICD-10-CM

## 2016-10-26 LAB — URINE CULTURE: ORGANISM ID, BACTERIA: NO GROWTH

## 2016-10-26 NOTE — Progress Notes (Signed)
Amy [name]  MRN: [id number] DOB: 01-09-1933  PCP: PROVIDER NOT IN SYSTEM  Subjective:  Pt is an 82 year old female who presents to clinic for f/u dizziness, fatigue, nausea. She is here today with her son.   She was here two days ago for the same. Of note, pt was started on Celexa 10mg  qd 9 days ago and decreased her Klonipin to 0.25 qd from 0.25mg  BID. She reported feeling funny after starting the medications, her symptoms worsened since that time, so she RTC.   Labs at that visit: Urine culture, micro and dip, CBC, glucose, orthostatic vital, CMP14+GFR. Remarkable for UTI, started on Keflex. She is still taking Keflex.   The dose of Celexa was decreased to 5mg  daily x one week and Klonipin increased to 0.25mg  BID. Pt was informed side effect may last up to 2 weeks, encouraged to make it to the two week mark. Asked to f/u in one week.  At her last appt with Tenna Delaine, pt's daughter in law pulled the provider aside and stated the pt "dose this each time she starts a medication" - she reads the side effects and c/o those effects.    Today she c/o waking up in the mornings with flushed face  - gets better in an hour or so, nausea, stumbling gait, "buzzing" in her ears. She is worried these are side effects from her medication and would like to stop Celexa.   Denies vomiting, diarrhea, chills, fever, falls, headache, syncope, chest pain, palpitations, wheezing, SOB, chest tightness, numbness, tinging, weakness.   Review of Systems  Constitutional: Positive for fatigue. Negative for chills, diaphoresis and fever.  HENT: Positive for tinnitus. Negative for congestion, ear discharge, hearing loss, postnasal drip, rhinorrhea, sinus pressure, sneezing and sore throat.   Respiratory: Negative for cough, chest tightness, shortness of breath and wheezing.   Cardiovascular: Negative for chest pain and palpitations.  Gastrointestinal: Positive for abdominal pain and nausea. Negative for diarrhea  and vomiting.  Genitourinary: Negative for dysuria, frequency and urgency.  Musculoskeletal: Positive for gait problem.  Skin: Negative for rash.  Neurological: Positive for dizziness and tremors. Negative for seizures, syncope, weakness, light-headedness and headaches.  Psychiatric/Behavioral: Negative for sleep disturbance.    There are no active problems to display for this patient.   Current Outpatient Prescriptions on File Prior to Visit  Medication Sig Dispense Refill  . aspirin 81 MG chewable tablet Chew 81 mg by mouth 2 (two) times daily.    . cephALEXin (KEFLEX) 500 MG capsule Take 1 capsule (500 mg total) by mouth 2 (two) times daily. 14 capsule 0  . citalopram (CELEXA) 20 MG tablet Take 0.5 tablets (10 mg total) by mouth daily. 30 tablet 0  . hydrochlorothiazide (HYDRODIURIL) 25 MG tablet Take 1 tablet (25 mg total) by mouth daily. 90 tablet 1  . losartan (COZAAR) 100 MG tablet Take 100 mg by mouth daily.  0  . polyethylene glycol (MIRALAX / GLYCOLAX) packet Take 17 g by mouth daily.    . simvastatin (ZOCOR) 40 MG tablet Take 1 tablet (40 mg total) by mouth every evening. 30 tablet 2  . clonazePAM (KLONOPIN) 0.5 MG tablet Take 0.5 mg by mouth 2 (two) times daily.   0   No current facility-administered medications on file prior to visit.     Allergies  Allergen Reactions  . Oxycodone Nausea And Vomiting and Other (See Comments)    Intolerance--"its like I was floating up on the ceiling"  Objective:  BP 132/68   Pulse 72   Temp 97.5 F (36.4 C) (Oral)   Resp 16   Ht 5' (1.524 m)   Wt 130 lb 12.8 oz (59.3 kg)   SpO2 95%   BMI 25.55 kg/m   Physical Exam  Constitutional: She is oriented to person, place, and time and well-developed, well-nourished, and in no distress. No distress.  HENT:  Right Ear: Tympanic membrane normal.  Left Ear: Tympanic membrane normal.  Eyes: Conjunctivae are normal. Pupils are equal, round, and reactive to light.  Cardiovascular:  Normal rate, regular rhythm and normal heart sounds.   Neurological: She is alert and oriented to person, place, and time. She displays no weakness. Gait normal. GCS score is 15.  Skin: Skin is warm and dry.  Psychiatric: Mood, memory, affect and judgment normal. She is not agitated.  Vitals reviewed.   Assessment and Plan :  1. Adverse effect of drug, subsequent encounter 2. Nausea without vomiting 3. Tinnitus of both ears 4. Feeling worried - Over half the time spent with patient was spent discussing possible side effects, and the possibility they last about 2 weeks. Advised patient she stay on current medication regimen until her scheduled f/u appt with Tenna Delaine. This way we will know if this medication is or is not working for her. She understands and agrees with plan.    Mercer Pod, PA-C  [facility] 10/26/2016 12:32 PM

## 2016-10-26 NOTE — Patient Instructions (Signed)
     IF you received an x-ray today, you will receive an invoice from [facility]. Please contact [facility] at [phone] with questions or concerns regarding your invoice.   IF you received labwork today, you will receive an invoice from [facility]. Please contact [facility] at [phone] with questions or concerns regarding your invoice.   Our billing staff will not be able to assist you with questions regarding bills from these companies.  You will be contacted with the lab results as soon as they are available. The fastest way to get your results is to activate your My Chart account. Instructions are located on the last page of this paperwork. If you have not heard from us regarding the results in 2 weeks, please contact this office.     

## 2016-10-30 ENCOUNTER — Encounter: Payer: Self-pay | Admitting: Physician Assistant

## 2016-10-30 ENCOUNTER — Ambulatory Visit (INDEPENDENT_AMBULATORY_CARE_PROVIDER_SITE_OTHER): Payer: Medicare Other | Admitting: Physician Assistant

## 2016-10-30 VITALS — BP 146/73 | HR 86 | Temp 97.9°F | Resp 18 | Ht 60.0 in | Wt 131.0 lb

## 2016-10-30 DIAGNOSIS — F411 Generalized anxiety disorder: Secondary | ICD-10-CM | POA: Diagnosis not present

## 2016-10-30 DIAGNOSIS — E878 Other disorders of electrolyte and fluid balance, not elsewhere classified: Secondary | ICD-10-CM | POA: Diagnosis not present

## 2016-10-30 MED ORDER — SERTRALINE HCL 25 MG PO TABS: 25.0000 mg | ORAL_TABLET | Freq: Every day | ORAL | 0 refills | Status: DC

## 2016-10-30 NOTE — Progress Notes (Signed)
Kiearra Oyervides  MRN: [id number] DOB: 09/22/1933  Subjective:  Amy [name] is a 83 y.o. female seen in office today for a chief complaint of adverse medication reaction. Intiially seen on 10/24/16 for adverse reactions to recent addition of celexa 52m . she was instructed to take 5372mand see if this decreased her symptoms and to increase her klonopin to 0.25 mg daily to help with anxiety in the meantime. Pt returned to clinic on 10/26/16 with persistent symptoms and recommended to try and push through to the 2 week mark to see if she could fully tolerate the medicine. Since her last visit on 10/26/16, she decided to stop taking both the celexa 72m19mnd klonopin 0.25. States she was tired of having the symptoms. Since stopping the medication, her nausea, headache, and blurred vision have improved. However, her head is buzzing like crazy. She also has a slight headache with some sinus congestion. She has been drinking lots of water. Her urinary symptoms have resolved.   Review of Systems  Constitutional: Positive for chills. Negative for diaphoresis and fever.  Respiratory: Negative for chest tightness and shortness of breath.     There are no active problems to display for this patient.   Current Outpatient Prescriptions on File Prior to Visit  Medication Sig Dispense Refill  . aspirin 81 MG chewable tablet Chew 81 mg by mouth 2 (two) times daily.    . cephALEXin (KEFLEX) 500 MG capsule Take 1 capsule (500 mg total) by mouth 2 (two) times daily. 14 capsule 0  . citalopram (CELEXA) 20 MG tablet Take 0.5 tablets (10 mg total) by mouth daily. 30 tablet 0  . clonazePAM (KLONOPIN) 0.5 MG tablet Take 0.5 mg by mouth 2 (two) times daily.   0  . hydrochlorothiazide (HYDRODIURIL) 25 MG tablet Take 1 tablet (25 mg total) by mouth daily. 90 tablet 1  . losartan (COZAAR) 100 MG tablet Take 100 mg by mouth daily.  0  . polyethylene glycol (MIRALAX / GLYCOLAX) packet Take 17 g by mouth daily.    . simvastatin  (ZOCOR) 40 MG tablet Take 1 tablet (40 mg total) by mouth every evening. 30 tablet 2   No current facility-administered medications on file prior to visit.     Allergies  Allergen Reactions  . Oxycodone Nausea And Vomiting and Other (See Comments)    Intolerance--"its like I was floating up on the ceiling"      Objective:  BP (!) 146/73 (BP Location: Right Arm, Patient Position: Sitting, Cuff Size: Normal)   Pulse 86   Resp 18   Ht 5' (1.524 m)   Wt 131 lb (59.4 kg)   SpO2 96%   BMI 25.58 kg/m   Physical Exam  Constitutional: She is oriented to person, place, and time and well-developed, well-nourished, and in no distress.  HENT:  Head: Normocephalic and atraumatic.  Right Ear: External ear and ear canal normal. A middle ear effusion ( mild) is present.  Left Ear: External ear and ear canal normal. A middle ear effusion ( mild) is present.  Nose: Mucosal edema present.  Mouth/Throat: Uvula is midline and mucous membranes are normal.  Eyes: Conjunctivae are normal.  Neck: Normal range of motion.  Cardiovascular: Normal rate, regular rhythm and normal heart sounds.   Pulmonary/Chest: Effort normal.  Neurological: She is alert and oriented to person, place, and time. She has normal sensation, normal reflexes and intact cranial nerves. She displays tremor (in head). She displays facial symmetry. She has  a normal Cerebellar Exam, a normal Finger-Nose-Finger Test and a normal Romberg Test. Gait normal.  Skin: Skin is warm and dry.  Psychiatric: Affect normal.  Vitals reviewed.   Assessment and Plan :  This case was precepted with Dr. Tamala Julian  1. Hypochloremia Await results  - CMP14+EGFR  2. Anxiety state Instructed to discontinue celexa due to side effects. Begin taking klonopin 0.40m every other day as these persistent symptoms may be due to benzo withdrawal. We will reevaluate the benzo taper in 2 weeks. Begin taking 1/2 tablet zoloft daily x 1 wk, increase to 1 tablet daily  for one week, then return to clinic for further evaluation as pt's anxiety needs to be better controlled. Instructed to seek care sooner if symptoms worsen or she develops any new concerning symptoms.  - sertraline (ZOLOFT) 25 MG tablet; Take 1 tablet (25 mg total) by mouth daily.  Dispense: 30 tablet; Refill: 0   BTenna DelainePA-C  [facility] 10/30/2016 3:23 PM

## 2016-10-30 NOTE — Patient Instructions (Addendum)
Take 1/2 tablet of klonopin every other day for the next two weeks.  Discontinue celexa fully.  Take 1/2 tablet of zoloft every day for one week then increase to one tablet daily for one week. Return in 2 weeks for reevaluation or sooner if you develop any concerning symptoms.  Thank you for letting me participate in your health and well being.    IF you received an x-ray today, you will receive an invoice from [facility]. Please contact [facility] at [phone] with questions or concerns regarding your invoice.   IF you received labwork today, you will receive an invoice from [facility]. Please contact [facility] at [phone] with questions or concerns regarding your invoice.   Our billing staff will not be able to assist you with questions regarding bills from these companies.  You will be contacted with the lab results as soon as they are available. The fastest way to get your results is to activate your My Chart account. Instructions are located on the last page of this paperwork. If you have not heard from [place] regarding the results in 2 weeks, please contact this office.

## 2016-10-31 LAB — CMP14+EGFR
ALT: 15 [IU]/L (ref 0–32)
AST: 19 [IU]/L (ref 0–40)
Albumin/Globulin Ratio: 1.5 (ref 1.2–2.2)
Albumin: 4.8 g/dL — ABNORMAL HIGH (ref 3.5–4.7)
Alkaline Phosphatase: 86 [IU]/L (ref 39–117)
BUN/Creatinine Ratio: 17 (ref 12–28)
BUN: 10 mg/dL (ref 8–27)
Bilirubin Total: 0.3 mg/dL (ref 0.0–1.2)
CO2: 27 mmol/L (ref 18–29)
Calcium: 9.5 mg/dL (ref 8.7–10.3)
Chloride: 87 mmol/L — ABNORMAL LOW (ref 96–106)
Creatinine, Ser: 0.59 mg/dL (ref 0.57–1.00)
GFR calc Af Amer: 99 mL/min/{1.73_m2}
GFR calc non Af Amer: 86 mL/min/{1.73_m2}
Globulin, Total: 3.1 g/dL (ref 1.5–4.5)
Glucose: 101 mg/dL — ABNORMAL HIGH (ref 65–99)
Potassium: 3.8 mmol/L (ref 3.5–5.2)
Sodium: 131 mmol/L — ABNORMAL LOW (ref 134–144)
Total Protein: 7.9 g/dL (ref 6.0–8.5)

## 2017-03-10 ENCOUNTER — Other Ambulatory Visit: Payer: Self-pay | Admitting: Physician Assistant

## 2017-03-10 DIAGNOSIS — I10 Essential (primary) hypertension: Secondary | ICD-10-CM

## 2017-03-11 ENCOUNTER — Other Ambulatory Visit: Payer: Self-pay | Admitting: Physician Assistant

## 2017-03-11 DIAGNOSIS — I10 Essential (primary) hypertension: Secondary | ICD-10-CM

## 2017-03-13 ENCOUNTER — Ambulatory Visit (INDEPENDENT_AMBULATORY_CARE_PROVIDER_SITE_OTHER): Payer: Medicare Other | Admitting: Physician Assistant

## 2017-03-13 ENCOUNTER — Encounter: Payer: Self-pay | Admitting: Physician Assistant

## 2017-03-13 VITALS — BP 148/60 | HR 79 | Temp 97.7°F | Resp 18 | Ht 60.0 in | Wt 120.0 lb

## 2017-03-13 DIAGNOSIS — E785 Hyperlipidemia, unspecified: Secondary | ICD-10-CM | POA: Diagnosis not present

## 2017-03-13 DIAGNOSIS — Z8639 Personal history of other endocrine, nutritional and metabolic disease: Secondary | ICD-10-CM

## 2017-03-13 DIAGNOSIS — I10 Essential (primary) hypertension: Secondary | ICD-10-CM

## 2017-03-13 MED ORDER — LOSARTAN POTASSIUM 100 MG PO TABS: 100.0000 mg | ORAL_TABLET | Freq: Every day | ORAL | 1 refills | Status: DC

## 2017-03-13 MED ORDER — HYDROCHLOROTHIAZIDE 25 MG PO TABS: ORAL_TABLET | ORAL | 1 refills | Status: DC

## 2017-03-13 MED ORDER — SIMVASTATIN 40 MG PO TABS: 40.0000 mg | ORAL_TABLET | Freq: Every evening | ORAL | 3 refills | Status: DC

## 2017-03-13 NOTE — Patient Instructions (Addendum)
Continue taking medication as prescribed. Start checking bp outside of office at least a few times a week. Goal is <140/90. If it is consistently above this please return for follow up sooner. Otherwise, I will see you in 6 months for an annual physical exam and we will refill your meds at that time. Good luck with your husband, your family is in my thoughts.   Our office will contact you next week with your lab results. Make sure you are getting at least 600mg  of supplementation of calcium a day and 1000 IU of vitamin d.    Bone Health Bones protect organs, store calcium, and anchor muscles. Good health habits, such as eating nutritious foods and exercising regularly, are important for maintaining healthy bones. They can also help to prevent a condition that causes bones to lose density and become weak and brittle (osteoporosis). Why is bone mass important? Bone mass refers to the amount of bone tissue that you have. The higher your bone mass, the stronger your bones. An important step toward having healthy bones throughout life is to have strong and dense bones during childhood. A young adult who has a high bone mass is more likely to have a high bone mass later in life. Bone mass at its greatest it is called peak bone mass. A large decline in bone mass occurs in older adults. In women, it occurs about the time of menopause. During this time, it is important to practice good health habits, because if more bone is lost than what is replaced, the bones will become less healthy and more likely to break (fracture). If you find that you have a low bone mass, you may be able to prevent osteoporosis or further bone loss by changing your diet and lifestyle. How can I find out if my bone mass is low? Bone mass can be measured with an X-ray test that is called a bone mineral density (BMD) test. This test is recommended for all women who are age 647 or older. It may also be recommended for men who are age 642 or  older, or for people who are more likely to develop osteoporosis due to:  Having bones that break easily.  Having a long-term disease that weakens bones, such as kidney disease or rheumatoid arthritis.  Having menopause earlier than normal.  Taking medicine that weakens bones, such as steroids, thyroid hormones, or hormone treatment for breast cancer or prostate cancer.  Smoking.  Drinking three or more alcoholic drinks each day. What are the nutritional recommendations for healthy bones? To have healthy bones, you need to get enough of the right minerals and vitamins. Most nutrition experts recommend getting these nutrients from the foods that you eat. Nutritional recommendations vary from person to person. Ask your health care provider what is healthy for you. Here are some general guidelines. Calcium Recommendations  Calcium is the most important (essential) mineral for bone health. Most people can get enough calcium from their diet, but supplements may be recommended for people who are at risk for osteoporosis. Good sources of calcium include:  Dairy products, such as low-fat or nonfat milk, cheese, and yogurt.  Dark green leafy vegetables, such as bok choy and broccoli.  Calcium-fortified foods, such as orange juice, cereal, bread, soy beverages, and tofu products.  Nuts, such as almonds. Follow these recommended amounts for daily calcium intake:  Children, age 64?3: 700 mg.  Children, age 68?8: 1,000 mg.  Children, age 65?13: 1,300 mg.  Teens, age 32?18:  1,300 mg.  Adults, age 24?50: 1,000 mg.  Adults, age 41?70:  Men: 1,000 mg.  Women: 1,200 mg.  Adults, age 59 or older: 1,200 mg.  Pregnant and breastfeeding females:  Teens: 1,300 mg.  Adults: 1,000 mg. Vitamin D Recommendations  Vitamin D is the most essential vitamin for bone health. It helps the body to absorb calcium. Sunlight stimulates the skin to make vitamin D, so be sure to get enough sunlight. If you  live in a cold climate or you do not get outside often, your health care provider may recommend that you take vitamin D supplements. Good sources of vitamin D in your diet include:  Egg yolks.  Saltwater fish.  Milk and cereal fortified with vitamin D. Follow these recommended amounts for daily vitamin D intake:  Children and teens, age 45?18: 56 international units.  Adults, age 87 or younger: 400-800 international units.  Adults, age 22 or older: 800-1,000 international units. Other Nutrients  Other nutrients for bone health include:  Phosphorus. This mineral is found in meat, poultry, dairy foods, nuts, and legumes. The recommended daily intake for adult men and adult women is 700 mg.  Magnesium. This mineral is found in seeds, nuts, dark green vegetables, and legumes. The recommended daily intake for adult men is 400?420 mg. For adult women, it is 310?320 mg.  Vitamin K. This vitamin is found in green leafy vegetables. The recommended daily intake is 120 mg for adult men and 90 mg for adult women. What type of physical activity is best for building and maintaining healthy bones? Weight-bearing and strength-building activities are important for building and maintaining peak bone mass. Weight-bearing activities cause muscles and bones to work against gravity. Strength-building activities increases muscle strength that supports bones. Weight-bearing and muscle-building activities include:  Walking and hiking.  Jogging and running.  Dancing.  Gym exercises.  Lifting weights.  Tennis and racquetball.  Climbing stairs.  Aerobics. Adults should get at least 30 minutes of moderate physical activity on most days. Children should get at least 60 minutes of moderate physical activity on most days. Ask your health care provide what type of exercise is best for you. Where can I find more information? For more information, check out the following websites:  [place]: YardHomes.se  [employer] of Health: http://www.niams.AnonymousEar.fr.asp This information is not intended to replace advice given to you by your health care provider. Make sure you discuss any questions you have with your health care provider. Document Released: 01/04/2004 Document Revised: 05/03/2016 Document Reviewed: 10/19/2014 Elsevier Interactive Patient Education  2017 [employer].     IF you received an x-ray today, you will receive an invoice from [facility]. Please contact [facility] at [phone] with questions or concerns regarding your invoice.   IF you received labwork today, you will receive an invoice from [facility]. Please contact [facility] at [phone] with questions or concerns regarding your invoice.   Our billing staff will not be able to assist you with questions regarding bills from these companies.  You will be contacted with the lab results as soon as they are available. The fastest way to get your results is to activate your My Chart account. Instructions are located on the last page of this paperwork. If you have not heard from [place] regarding the results in 2 weeks, please contact this office.

## 2017-03-13 NOTE — Progress Notes (Signed)
MRN: [id number] DOB: June 13, 1933  Subjective:   Amy [name] is a 83 y.o. female presenting for follow up on Hypertension. Last OV was 10/2016.Currently managed with HCTZ 19m and losartan 1024m Patient is checking blood pressure at home, range is 13383Aystolic. Has not been checking it as frequently as her husband in home hospice now she has been really stressed Denies lightheadedness, dizziness, chronic headache, double vision, chest pain, shortness of breath, heart racing, palpitations, nausea, vomiting, abdominal pain, hematuria, lower leg swelling. Also, needs refills for HLD medication. Controlled on simvastatin 4032mPt also wondering if she should be supplementing vit d as she thinks she used to be deficient and does not currently take any OTC supplements.    Amy [name] which includes the following prescription(s): aspirin, clonazepam, hydrochlorothiazide, losartan, polyethylene glycol, sertraline, and simvastatin. Also is allergic to oxycodone.  Amy [name] of Hyperlipidemia; Hypertension; and Rheumatic heart disease. Also  has a past surgical [name] that includes Appendectomy.   Objective:   Vitals: BP (!) 148/60   Pulse 79   Temp 97.7 F (36.5 C) (Oral)   Resp 18   Ht 5' (1.524 m)   Wt 120 lb (54.4 kg)   SpO2 96%   BMI 23.44 kg/m   Physical Exam  Constitutional: She is oriented to person, place, and time. She appears well-developed and well-nourished.  HENT:  Head: Normocephalic and atraumatic.  Eyes: Conjunctivae are normal.  Neck: Normal range of motion.  Cardiovascular: Normal rate, regular rhythm, normal heart sounds and intact distal pulses.   Pulmonary/Chest: Effort normal and breath sounds normal.  Musculoskeletal:       Right lower leg: She exhibits no swelling.       Left lower leg: She exhibits no swelling.  Neurological: She is alert and oriented to person, place, and time.  Skin: Skin is warm and dry.    Psychiatric: She has a normal mood and affect.  Vitals reviewed.   Wt Readings from Last 3 Encounters:  03/13/17 120 lb (54.4 kg)  10/30/16 131 lb (59.4 kg)  10/26/16 130 lb 12.8 oz (59.3 kg)    No results found for this or any previous visit (from the past 24 hour(s)).  Assessment and Plan :  1. Essential hypertension Slightly uncontrolled in office today. She is stressed right now as she is dealing with her husband being placed with home hospice. Per pt, her readings at home are consistently in the 130s. Pt encouraged to check bp at home more regularly, goal is <140/90, if consistently elevated beyond this value, return to clinic for reevaluation. Otherwise, follow up in 6 months for CPE and bp recheck.  - CMP14+EGFR - hydrochlorothiazide (HYDRODIURIL) 25 MG tablet; TAKE 1 TABLET(25 MG) BY MOUTH DAILY  Dispense: 90 tablet; Refill: 1 - losartan (COZAAR) 100 MG tablet; Take 1 tablet (100 mg total) by mouth daily.  Dispense: 90 tablet; Refill: 1  2. Hyperlipidemia, unspecified hyperlipidemia type Pt did eat a few veggie chips this morning.  - Lipid panel - simvastatin (ZOCOR) 40 MG tablet; Take 1 tablet (40 mg total) by mouth every evening.  Dispense: 90 tablet; Refill: 3  3. [name] of vitamin D deficiency Instructed pt to be on at least 1000IU of vit d daily, along with 600m33m calcium supplementation as she does eat dairy products (yogurt) every day.  - VITAMIN D 25 Hydroxy (Vit-D Deficiency, Fractures)  Amy [name]  [facility] 03/13/2017 11:28 AM

## 2017-03-14 LAB — CMP14+EGFR
ALT: 16 IU/L (ref 0–32)
AST: 19 IU/L (ref 0–40)
Albumin/Globulin Ratio: 1.5 (ref 1.2–2.2)
Albumin: 4.3 g/dL (ref 3.5–4.7)
Alkaline Phosphatase: 75 IU/L (ref 39–117)
BILIRUBIN TOTAL: 0.2 mg/dL (ref 0.0–1.2)
BUN/Creatinine Ratio: 22 (ref 12–28)
BUN: 14 mg/dL (ref 8–27)
CALCIUM: 9.4 mg/dL (ref 8.7–10.3)
CHLORIDE: 93 mmol/L — AB (ref 96–106)
CO2: 26 mmol/L (ref 18–29)
Creatinine, Ser: 0.64 mg/dL (ref 0.57–1.00)
GFR, EST AFRICAN AMERICAN: 96 mL/min/{1.73_m2} (ref >59)
GFR, EST NON AFRICAN AMERICAN: 83 mL/min/{1.73_m2} (ref >59)
GLUCOSE: 101 mg/dL — AB (ref 65–99)
Globulin, Total: 2.8 g/dL (ref 1.5–4.5)
POTASSIUM: 3.6 mmol/L (ref 3.5–5.2)
Sodium: 135 mmol/L (ref 134–144)
TOTAL PROTEIN: 7.1 g/dL (ref 6.0–8.5)

## 2017-03-14 LAB — VITAMIN D 25 HYDROXY (VIT D DEFICIENCY, FRACTURES): Vit D, 25-Hydroxy: 34.3 ng/mL (ref 30.0–100.0)

## 2017-03-14 LAB — LIPID PANEL
Chol/HDL Ratio: 2.2 ratio (ref 0.0–4.4)
Cholesterol, Total: 143 mg/dL (ref 100–199)
HDL: 66 mg/dL (ref >39)
LDL Calculated: 40 mg/dL (ref 0–99)
TRIGLYCERIDES: 187 mg/dL — AB (ref 0–149)
VLDL CHOLESTEROL CAL: 37 mg/dL (ref 5–40)

## 2017-03-18 ENCOUNTER — Encounter: Payer: Self-pay | Admitting: Radiology

## 2018-05-15 ENCOUNTER — Encounter (HOSPITAL_COMMUNITY): Payer: Self-pay

## 2018-05-15 ENCOUNTER — Emergency Department (HOSPITAL_COMMUNITY): Payer: Medicare Other

## 2018-05-15 ENCOUNTER — Other Ambulatory Visit: Payer: Self-pay

## 2018-05-15 ENCOUNTER — Emergency Department (HOSPITAL_COMMUNITY)
Admission: EM | Admit: 2018-05-15 | Discharge: 2018-05-15 | Disposition: A | Discharge status: Home / Self Care | Payer: Medicare Other | Attending: Emergency Medicine | Admitting: Emergency Medicine

## 2018-05-15 DIAGNOSIS — Z79899 Other long term (current) drug therapy: Secondary | ICD-10-CM | POA: Insufficient documentation

## 2018-05-15 DIAGNOSIS — I0989 Other specified rheumatic heart diseases: Secondary | ICD-10-CM | POA: Insufficient documentation

## 2018-05-15 DIAGNOSIS — R262 Difficulty in walking, not elsewhere classified: Secondary | ICD-10-CM | POA: Diagnosis not present

## 2018-05-15 DIAGNOSIS — I10 Essential (primary) hypertension: Secondary | ICD-10-CM | POA: Insufficient documentation

## 2018-05-15 DIAGNOSIS — Z87891 Personal history of nicotine dependence: Secondary | ICD-10-CM | POA: Insufficient documentation

## 2018-05-15 DIAGNOSIS — R531 Weakness: Secondary | ICD-10-CM | POA: Insufficient documentation

## 2018-05-15 LAB — URINALYSIS, ROUTINE W REFLEX MICROSCOPIC
BILIRUBIN URINE: NEGATIVE
GLUCOSE, UA: NEGATIVE mg/dL
Hgb urine dipstick: NEGATIVE
KETONES UR: NEGATIVE mg/dL
Leukocytes, UA: NEGATIVE
Nitrite: NEGATIVE
PROTEIN: NEGATIVE mg/dL
Specific Gravity, Urine: 1.013 (ref 1.005–1.030)
pH: 8 (ref 5.0–8.0)

## 2018-05-15 LAB — RAPID URINE DRUG SCREEN, HOSP PERFORMED
Amphetamines: NOT DETECTED
BARBITURATES: NOT DETECTED
BENZODIAZEPINES: NOT DETECTED
Cocaine: NOT DETECTED
Opiates: NOT DETECTED
TETRAHYDROCANNABINOL: NOT DETECTED

## 2018-05-15 LAB — COMPREHENSIVE METABOLIC PANEL
ALBUMIN: 4.1 g/dL (ref 3.5–5.0)
ALT: 16 U/L (ref 0–44)
AST: 21 U/L (ref 15–41)
Alkaline Phosphatase: 66 U/L (ref 38–126)
Anion gap: 9 (ref 5–15)
BILIRUBIN TOTAL: 0.2 mg/dL — AB (ref 0.3–1.2)
BUN: 13 mg/dL (ref 8–23)
CO2: 30 mmol/L (ref 22–32)
CREATININE: 0.66 mg/dL (ref 0.44–1.00)
Calcium: 9.2 mg/dL (ref 8.9–10.3)
Chloride: 99 mmol/L (ref 98–111)
GFR calc Af Amer: >60 mL/min (ref >60)
GLUCOSE: 93 mg/dL (ref 70–99)
Potassium: 3.7 mmol/L (ref 3.5–5.1)
Sodium: 138 mmol/L (ref 135–145)
Total Protein: 7.5 g/dL (ref 6.5–8.1)

## 2018-05-15 LAB — DIFFERENTIAL
BASOS PCT: 1 %
Basophils Absolute: 0 10*3/uL (ref 0.0–0.1)
Eosinophils Absolute: 0.1 10*3/uL (ref 0.0–0.7)
Eosinophils Relative: 1 %
LYMPHS ABS: 1.8 10*3/uL (ref 0.7–4.0)
Lymphocytes Relative: 29 %
MONOS PCT: 11 %
Monocytes Absolute: 0.7 10*3/uL (ref 0.1–1.0)
NEUTROS ABS: 3.6 10*3/uL (ref 1.7–7.7)
Neutrophils Relative %: 58 %

## 2018-05-15 LAB — PROTIME-INR
INR: 0.93
Prothrombin Time: 12.4 seconds (ref 11.4–15.2)

## 2018-05-15 LAB — CBC
HEMATOCRIT: 39.2 % (ref 36.0–46.0)
HEMOGLOBIN: 12.8 g/dL (ref 12.0–15.0)
MCH: 30 pg (ref 26.0–34.0)
MCHC: 32.7 g/dL (ref 30.0–36.0)
MCV: 91.8 fL (ref 78.0–100.0)
Platelets: 311 10*3/uL (ref 150–400)
RBC: 4.27 MIL/uL (ref 3.87–5.11)
RDW: 13 % (ref 11.5–15.5)
WBC: 6.2 10*3/uL (ref 4.0–10.5)

## 2018-05-15 LAB — I-STAT TROPONIN, ED: TROPONIN I, POC: 0 ng/mL (ref 0.00–0.08)

## 2018-05-15 LAB — APTT: APTT: 35 s (ref 24–36)

## 2018-05-15 MED ORDER — SODIUM CHLORIDE 0.9 % IV BOLUS
500.0000 mL | Freq: Once | INTRAVENOUS | Status: AC
  Administered 2018-05-15: 500 mL via INTRAVENOUS

## 2018-05-15 MED ORDER — SODIUM CHLORIDE 0.9 % IV SOLN
100.0000 mL/h | INTRAVENOUS | Status: DC
  Administered 2018-05-15: 100 mL/h via INTRAVENOUS

## 2018-05-15 NOTE — ED Notes (Signed)
Pt transported to MRI 

## 2018-05-15 NOTE — ED Triage Notes (Addendum)
Pt sent from [place], by Patriciaann Clan PA. PA concerned for TIA, wanted CT and MRI. Pt's daughter states that she has been off balance and walking backwards. Pt NIHSS upon assessment 0. Pt has hx of TIA.

## 2018-05-15 NOTE — Discharge Instructions (Signed)
As discussed, your evaluation today has been largely reassuring.  But, it is important that you monitor your condition carefully, and do not hesitate to return to the ED if you develop new, or concerning changes in your condition. ? ?Otherwise, please follow-up with your physician for appropriate ongoing care. ? ?

## 2018-05-15 NOTE — ED Provider Notes (Signed)
[facility] DEPT Provider Note   CSN: [id number] Arrival date & time: 05/15/18  1152     History   Chief Complaint Chief Complaint  Patient presents with  . Difficulty Walking    HPI Amy [name] is a 84 y.o. female.  HPI Patient presents with her daughter-in-law who assists with the HPI. Patient has a history of rheumatic heart disease as a child, but no history of cardiac disease currently, no history of stroke. Daughter-in-law reports she might have history of TIA. She presents today due to concern for ataxia. She notes that over the past weeks possibly longer, she has had ataxia, without clear precipitant, and occurring with waxing/waning severity. No fall, though she feels as though she is likely to do so, particularly not using an assistive device such as a shopping cart. No speech difficulty, weakness in her extremities, though she does complain of generalized weakness. Patient saw her physician today was referred here for evaluation of possible stroke.  Past Medical History:  Diagnosis Date  . Hyperlipidemia   . Hypertension   . Rheumatic heart disease     There are no active problems to display for this patient.   Past Surgical History:  Procedure Laterality Date  . APPENDECTOMY       OB History   None      Home Medications    Prior to Admission medications   Medication Sig Start Date End Date Taking? Authorizing Provider  aspirin 81 MG chewable tablet Chew 81 mg by mouth 2 (two) times daily.   Yes [provider]  BIOTIN PO Take 1 tablet by mouth daily.   Yes [provider]  buPROPion (WELLBUTRIN XL) 150 MG 24 hr tablet Take 150 mg by mouth daily. 04/07/18  Yes [provider]  clonazePAM (KLONOPIN) 0.5 MG tablet Take 0.5 mg by mouth daily as needed for anxiety.  03/01/15  Yes [provider]  escitalopram (LEXAPRO) 20 MG tablet Take 20 mg by mouth every evening. 05/10/18  Yes [provider]  IRON PO Take 1 tablet by mouth daily.   Yes [provider]  losartan-hydrochlorothiazide (HYZAAR) 100-12.5 MG tablet Take 1 tablet by mouth daily. 03/17/18  Yes [provider]  Multiple Vitamins-Calcium (ONE-A-DAY WOMENS PO) Take 1 tablet by mouth daily.   Yes [provider]  rosuvastatin (CRESTOR) 20 MG tablet Take 20 mg by mouth daily. 05/10/18  Yes [provider]  sertraline (ZOLOFT) 25 MG tablet Take 1 tablet (25 mg total) by mouth daily. 10/30/16  Yes Timmothy Euler, [place] D, PA-C  hydrochlorothiazide (HYDRODIURIL) 25 MG tablet TAKE 1 TABLET(25 MG) BY MOUTH DAILY Patient not taking: Reported on 05/15/2018 03/13/17   Tenna Delaine D, PA-C  losartan (COZAAR) 100 MG tablet Take 1 tablet (100 mg total) by mouth daily. Patient not taking: Reported on 05/15/2018 03/13/17   Tenna Delaine D, PA-C  simvastatin (ZOCOR) 40 MG tablet Take 1 tablet (40 mg total) by mouth every evening. Patient not taking: Reported on 05/15/2018 03/13/17   Leonie Douglas, PA-C    Family History Family History  Problem Relation Age of Onset  . Stroke Maternal Grandmother     Social History Social History   Tobacco Use  . Smoking status: Former Smoker    Packs/day: 0.50    Years: 30.00    Pack years: 15.00  . Smokeless tobacco: Never Used  Substance Use Topics  . Alcohol use: No  . Drug use: No  Allergies   Oxycodone   Review of Systems Review of Systems  Constitutional:       Per HPI, otherwise negative  HENT:       Per HPI, otherwise negative  Respiratory:       Per HPI, otherwise negative  Cardiovascular:       Per HPI, otherwise negative  Gastrointestinal: Negative for vomiting.  Endocrine:       Negative aside from HPI  Genitourinary:       Neg aside from HPI   Musculoskeletal:       Per HPI, otherwise negative  Skin: Negative.   Neurological: Positive for weakness. Negative for syncope.     Physical Exam Updated Vital  Signs BP (!) 176/72   Pulse 74   Temp 98.2 F (36.8 C) (Oral)   Resp 12   Ht 5\' 1"  (1.549 m)   Wt 59 kg (130 lb)   SpO2 98%   BMI 24.56 kg/m   Physical Exam  Constitutional: She is oriented to person, place, and time. She appears well-developed and well-nourished. No distress.  HENT:  Head: Normocephalic and atraumatic.  Eyes: Conjunctivae and EOM are normal.  Cardiovascular: Normal rate and regular rhythm.  Pulmonary/Chest: Effort normal and breath sounds normal. No stridor. No respiratory distress.  Abdominal: She exhibits no distension.  Musculoskeletal: She exhibits no edema.  Neurological: She is alert and oriented to person, place, and time. She displays atrophy. She displays no tremor. No cranial nerve deficit. She displays no seizure activity. Coordination and gait abnormal.  Skin: Skin is warm and dry.  Psychiatric: She has a normal mood and affect.  Nursing note and vitals reviewed.    ED Treatments / Results  Labs (all labs ordered are listed, but only abnormal results are displayed) Labs Reviewed  COMPREHENSIVE METABOLIC PANEL - Abnormal; Notable for the following components:      Result Value   Total Bilirubin 0.2 (*)    All other components within normal limits  PROTIME-INR  APTT  CBC  DIFFERENTIAL  RAPID URINE DRUG SCREEN, HOSP PERFORMED  URINALYSIS, ROUTINE W REFLEX MICROSCOPIC  I-STAT TROPONIN, ED    EKG None  Radiology Mr Brain Wo Contrast  Result Date: 05/15/2018 CLINICAL DATA:  Balance disturbance and gait disturbance. Possible TIA. EXAM: MRI HEAD WITHOUT CONTRAST TECHNIQUE: Multiplanar, multiecho pulse sequences of the brain and surrounding structures were obtained without intravenous contrast. COMPARISON:  Head CT 03/22/2016 FINDINGS: Brain: Diffusion imaging does not show any acute or subacute infarction. There chronic small-vessel ischemic changes of the pons. 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 Cerebral hemispheres  show moderate chronic small-vessel ischemic change of the deep and subcortical white matter. No cortical or large vessel territory infarction. No mass lesion, hemorrhage, hydrocephalus or extra-axial collection. Vascular: Major vessels at the base of the brain show flow. Skull and upper cervical spine: Negative Sinuses/Orbits: Clear/normal Other: None IMPRESSION: No acute finding by MRI. Chronic small-vessel ischemic changes affecting the pons, cerebellum and cerebral hemispheric white matter. Electronically Signed   By: Nelson Chimes M.D.   On: 05/15/2018 14:39    Procedures Procedures (including critical care time)  Medications Ordered in ED Medications  sodium chloride 0.9 % bolus 500 mL (500 mLs Intravenous New Bag/Given 05/15/18 1326)    Followed by  0.9 %  sodium chloride infusion (100 mL/hr Intravenous New Bag/Given 05/15/18 1327)     Initial Impression / Assessment and Plan / ED Course  I have reviewed the triage vital  signs and the nursing notes.  Pertinent labs & imaging results that were available during my care of the patient were reviewed by me and considered in my medical decision making (see chart for details).     3:01 PM On repeat exam the patient is awake and alert, in no distress.  We discussed all findings including reassuring MRI, labs. With no evidence for stroke, patient will follow-up with primary care.  This elderly female with a history of TIA presents with ongoing gait difficulty. She is awake, alert, with some difficulty walking, though no falling. No other neurologic deficits, and symptoms been persistent for some time, though possibly worse recently. Some suspicion for either degenerative condition versus medication effects, but with no evidence for stroke, no hemodynamic stability, no lab abnormalities, the patient will follow-up in the office.  Final Clinical Impressions(s) / ED Diagnoses   Final diagnoses:  Difficulty walking     Carmin Muskrat,  MD 05/15/18 1505

## 2019-12-26 ENCOUNTER — Ambulatory Visit: Payer: Medicare Other | Attending: Internal Medicine

## 2019-12-26 ENCOUNTER — Other Ambulatory Visit: Payer: Self-pay

## 2019-12-26 DIAGNOSIS — Z23 Encounter for immunization: Secondary | ICD-10-CM | POA: Insufficient documentation

## 2019-12-26 NOTE — Progress Notes (Signed)
   Covid-19 Vaccination Clinic  Name:  Surina Zabaleta    MRN: [id number] DOB: 07-26-1933  12/26/2019  Ms. Pasqualone was observed post Covid-19 immunization for 15 minutes without incidence. She was provided with Vaccine Information Sheet and instruction to access the V-Safe system.   Ms. Scheibner was instructed to call 911 with any severe reactions post vaccine: Marland Kitchen Difficulty breathing  . Swelling of your face and throat  . A fast heartbeat  . A bad rash all over your body  . Dizziness and weakness    Immunizations Administered    Name Date Dose VIS Date Route   Pfizer COVID-19 Vaccine 12/26/2019 10:56 AM 0.3 mL 10/08/2019 Intramuscular   Manufacturer: [place]   Lot: [id number]   [place]: [id number]

## 2020-01-17 ENCOUNTER — Ambulatory Visit: Payer: Medicare Other | Attending: Internal Medicine

## 2020-01-17 DIAGNOSIS — Z23 Encounter for immunization: Secondary | ICD-10-CM

## 2020-01-17 NOTE — Progress Notes (Signed)
   Covid-19 Vaccination Clinic  Name:  Amy [name]    MRN: [id number] DOB: 01-Nov-1932  01/17/2020  Amy [name] was observed post Covid-19 immunization for 15 minutes without incident. She was provided with Vaccine Information Sheet and instruction to access the V-Safe system.   Amy [name] was instructed to call 911 with any severe reactions post vaccine: Marland Kitchen Difficulty breathing  . Swelling of face and throat  . A fast heartbeat  . A bad rash all over body  . Dizziness and weakness   Immunizations Administered    Name Date Dose VIS Date Route   Pfizer COVID-19 Vaccine 01/17/2020  9:47 AM 0.3 mL 10/08/2019 Intramuscular   Manufacturer: [place]   Lot: [id number]   [place]: [id number]

## 2020-01-26 ENCOUNTER — Ambulatory Visit: Payer: Medicare Other

## 2020-06-06 ENCOUNTER — Encounter (HOSPITAL_COMMUNITY): Payer: Self-pay | Admitting: Nurse Practitioner

## 2020-06-06 ENCOUNTER — Other Ambulatory Visit: Payer: Self-pay

## 2020-06-06 ENCOUNTER — Ambulatory Visit (HOSPITAL_COMMUNITY)
Admission: RE | Admit: 2020-06-06 | Discharge: 2020-06-06 | Disposition: A | Discharge status: Home / Self Care | Payer: Medicare Other | Source: Ambulatory Visit | Attending: Nurse Practitioner | Admitting: Nurse Practitioner

## 2020-06-06 VITALS — BP 112/60 | HR 63 | Ht 61.0 in | Wt 125.0 lb

## 2020-06-06 DIAGNOSIS — I4891 Unspecified atrial fibrillation: Secondary | ICD-10-CM | POA: Insufficient documentation

## 2020-06-06 DIAGNOSIS — E785 Hyperlipidemia, unspecified: Secondary | ICD-10-CM | POA: Diagnosis not present

## 2020-06-06 DIAGNOSIS — I10 Essential (primary) hypertension: Secondary | ICD-10-CM | POA: Insufficient documentation

## 2020-06-06 DIAGNOSIS — Z79899 Other long term (current) drug therapy: Secondary | ICD-10-CM | POA: Diagnosis not present

## 2020-06-06 DIAGNOSIS — Z87891 Personal history of nicotine dependence: Secondary | ICD-10-CM | POA: Insufficient documentation

## 2020-06-06 DIAGNOSIS — F419 Anxiety disorder, unspecified: Secondary | ICD-10-CM | POA: Diagnosis not present

## 2020-06-06 DIAGNOSIS — Z7901 Long term (current) use of anticoagulants: Secondary | ICD-10-CM | POA: Insufficient documentation

## 2020-06-06 MED ORDER — APIXABAN 2.5 MG PO TABS: 2.5000 mg | ORAL_TABLET | Freq: Two times a day (BID) | ORAL | 6 refills | Status: DC

## 2020-06-06 NOTE — Patient Instructions (Signed)
Stop Aspirin Start Eliquis 2.5mg - take one tablet by mouth twice daily

## 2020-06-06 NOTE — Progress Notes (Signed)
Primary Care Physician: Amy Contras, MD Referring Physician: Dr. Marjory [name] Amy [name] is a 86 y.o. female with a h/o HTN, anxiety, that is in the afib clinic for new onset afib, found at routine PCP visit. Pt was asymptomatic. She was started on BB and referred here. EKG now shows SR. She is with her son. She denies, tobacco, alcohol, caffeine use. No snoring history.   Today, she denies symptoms of palpitations, chest pain, shortness of breath, orthopnea, PND, lower extremity edema, dizziness, presyncope, syncope, or neurologic sequela. The patient is tolerating medications without difficulties and is otherwise without complaint today.   Past Medical History:  Diagnosis Date  . Hyperlipidemia   . Hypertension   . Rheumatic heart disease    Past Surgical History:  Procedure Laterality Date  . APPENDECTOMY      Current Outpatient Medications  Medication Sig Dispense Refill  . apixaban (ELIQUIS) 2.5 MG TABS tablet Take by mouth 2 (two) times daily.    Marland Kitchen BIOTIN PO Take 1 tablet by mouth daily.    Marland Kitchen buPROPion (WELLBUTRIN XL) 150 MG 24 hr tablet Take 150 mg by mouth daily.  3  . hydrochlorothiazide (HYDRODIURIL) 25 MG tablet TAKE 1 TABLET(25 MG) BY MOUTH DAILY 90 tablet 1  . hydrOXYzine (ATARAX/VISTARIL) 25 MG tablet 1/2-1 tablet  As needed for anxiety every 8 hours    . IRON PO Take 1 tablet by mouth daily.    Marland Kitchen losartan (COZAAR) 100 MG tablet Take 1 tablet (100 mg total) by mouth daily. 90 tablet 1  . metoprolol succinate (TOPROL-XL) 25 MG 24 hr tablet Take 25 mg by mouth daily.    . Multiple Vitamins-Calcium (ONE-A-DAY WOMENS PO) Take 1 tablet by mouth daily.    Marland Kitchen apixaban (ELIQUIS) 2.5 MG TABS tablet Take 1 tablet (2.5 mg total) by mouth 2 (two) times daily. 60 tablet 6   No current facility-administered medications for this encounter.    Allergies  Allergen Reactions  . Oxycodone Nausea And Vomiting and Other (See Comments)    Intolerance--"its like I was floating up on  the ceiling"     Social History   Socioeconomic History  . Marital status: Married    Spouse name: Not on file  . Number of children: Not on file  . Years of education: Not on file  . Highest education level: Not on file  Occupational History  . Not on file  Tobacco Use  . Smoking status: Former Smoker    Packs/day: 0.50    Years: 30.00    Pack years: 15.00  . Smokeless tobacco: Never Used  Substance and Sexual Activity  . Alcohol use: No  . Drug use: No  . Sexual activity: Not on file  Other Topics Concern  . Not on file  Social History Narrative  . Not on file   Social Determinants of Health   Financial Resource Strain:   . Difficulty of Paying Living Expenses:   Food Insecurity:   . Worried About Charity fundraiser in the Last Year:   . Arboriculturist in the Last Year:   Transportation Needs:   . Film/video editor (Medical):   Marland Kitchen Lack of Transportation (Non-Medical):   Physical Activity:   . Days of Exercise per Week:   . Minutes of Exercise per Session:   Stress:   . Feeling of Stress :   Social Connections:   . Frequency of Communication with Friends and Family:   .  Frequency of Social Gatherings with Friends and Family:   . Attends Religious Services:   . Active Member of Clubs or Organizations:   . Attends Archivist Meetings:   Marland Kitchen Marital Status:   Intimate Partner Violence:   . Fear of Current or Ex-Partner:   . Emotionally Abused:   Marland Kitchen Physically Abused:   . Sexually Abused:     Family History  Problem Relation Age of Onset  . Stroke Maternal Grandmother     ROS- All systems are reviewed and negative except as per the HPI above  Physical Exam: Vitals:   06/06/20 1137  BP: 112/60  Pulse: 63  Weight: 56.7 kg  Height: 5\' 1"  (1.549 m)   Wt Readings from Last 3 Encounters:  06/06/20 56.7 kg  05/15/18 59 kg  03/13/17 54.4 kg    Labs: Lab Results  Component Value Date   NA 138 05/15/2018   K 3.7 05/15/2018   CL 99  05/15/2018   CO2 30 05/15/2018   GLUCOSE 93 05/15/2018   BUN 13 05/15/2018   CREATININE 0.66 05/15/2018   CALCIUM 9.2 05/15/2018   Lab Results  Component Value Date   INR 0.93 05/15/2018   Lab Results  Component Value Date   CHOL 143 03/13/2017   HDL 66 03/13/2017   LDLCALC 40 03/13/2017   TRIG 187 (H) 03/13/2017     GEN- The patient is well appearing, alert and oriented x 3 today.   Head- normocephalic, atraumatic Eyes-  Sclera clear, conjunctiva pink Ears- hearing intact Oropharynx- clear Neck- supple, no JVP Lymph- no cervical lymphadenopathy Lungs- Clear to ausculation bilaterally, normal work of breathing Heart- Regular rate and rhythm, no murmurs, rubs or gallops, PMI not laterally displaced GI- soft, NT, ND, + BS Extremities- no clubbing, cyanosis, or edema MS- no significant deformity or atrophy Skin- no rash or lesion Psych- euthymic mood, full affect Neuro- strength and sensation are intact  EKG-NSR with first degree AV block ekg from PCP office reviewed that showed afb    Assessment and Plan: 1. New onset afib General education re afib and triggers discussed  Continue  Metoprolol succinate 25 mg qd  2. CHA2DS2VASc score of 3 Denies a bleeding or fall history Discussed the risk vrs benefit and the pt and son want to start anticoagulation  Will  rx elquis 2.5 mg bid  Bleeding risk and precautions  discussed  Stop asa  30 day free samples given   3. HTN Stable  No change   I will see back in 3 weeks    Amy [name], [facility] [address] [place], [place] [address] [phone]

## 2020-06-07 NOTE — Addendum Note (Signed)
Encounter addended by: Bobby Rumpf, CMA on: 06/07/2020 8:49 AM  Actions taken: Order Reconciliation Section accessed

## 2020-06-08 ENCOUNTER — Other Ambulatory Visit: Payer: Self-pay | Admitting: Family Medicine

## 2020-06-08 DIAGNOSIS — E2839 Other primary ovarian failure: Secondary | ICD-10-CM

## 2020-06-29 ENCOUNTER — Ambulatory Visit (HOSPITAL_COMMUNITY): Payer: Medicare Other | Admitting: Nurse Practitioner

## 2020-09-14 ENCOUNTER — Other Ambulatory Visit: Payer: Medicare Other

## 2020-11-24 DIAGNOSIS — I5032 Chronic diastolic (congestive) heart failure: Secondary | ICD-10-CM | POA: Diagnosis not present

## 2020-11-24 DIAGNOSIS — E7800 Pure hypercholesterolemia, unspecified: Secondary | ICD-10-CM | POA: Diagnosis not present

## 2020-11-24 DIAGNOSIS — E785 Hyperlipidemia, unspecified: Secondary | ICD-10-CM | POA: Diagnosis not present

## 2020-11-24 DIAGNOSIS — M810 Age-related osteoporosis without current pathological fracture: Secondary | ICD-10-CM | POA: Diagnosis not present

## 2020-11-24 DIAGNOSIS — I4891 Unspecified atrial fibrillation: Secondary | ICD-10-CM | POA: Diagnosis not present

## 2020-11-24 DIAGNOSIS — I10 Essential (primary) hypertension: Secondary | ICD-10-CM | POA: Diagnosis not present

## 2020-12-20 DIAGNOSIS — E785 Hyperlipidemia, unspecified: Secondary | ICD-10-CM | POA: Diagnosis not present

## 2020-12-20 DIAGNOSIS — M810 Age-related osteoporosis without current pathological fracture: Secondary | ICD-10-CM | POA: Diagnosis not present

## 2020-12-20 DIAGNOSIS — I5032 Chronic diastolic (congestive) heart failure: Secondary | ICD-10-CM | POA: Diagnosis not present

## 2020-12-20 DIAGNOSIS — I10 Essential (primary) hypertension: Secondary | ICD-10-CM | POA: Diagnosis not present

## 2020-12-20 DIAGNOSIS — E7800 Pure hypercholesterolemia, unspecified: Secondary | ICD-10-CM | POA: Diagnosis not present

## 2020-12-20 DIAGNOSIS — I4891 Unspecified atrial fibrillation: Secondary | ICD-10-CM | POA: Diagnosis not present

## 2021-01-23 DIAGNOSIS — E785 Hyperlipidemia, unspecified: Secondary | ICD-10-CM | POA: Diagnosis not present

## 2021-01-23 DIAGNOSIS — E7800 Pure hypercholesterolemia, unspecified: Secondary | ICD-10-CM | POA: Diagnosis not present

## 2021-01-23 DIAGNOSIS — I10 Essential (primary) hypertension: Secondary | ICD-10-CM | POA: Diagnosis not present

## 2021-01-23 DIAGNOSIS — M810 Age-related osteoporosis without current pathological fracture: Secondary | ICD-10-CM | POA: Diagnosis not present

## 2021-01-23 DIAGNOSIS — I5032 Chronic diastolic (congestive) heart failure: Secondary | ICD-10-CM | POA: Diagnosis not present

## 2021-01-23 DIAGNOSIS — I4891 Unspecified atrial fibrillation: Secondary | ICD-10-CM | POA: Diagnosis not present

## 2021-02-20 DIAGNOSIS — I4891 Unspecified atrial fibrillation: Secondary | ICD-10-CM | POA: Diagnosis not present

## 2021-02-20 DIAGNOSIS — I5032 Chronic diastolic (congestive) heart failure: Secondary | ICD-10-CM | POA: Diagnosis not present

## 2021-02-20 DIAGNOSIS — E785 Hyperlipidemia, unspecified: Secondary | ICD-10-CM | POA: Diagnosis not present

## 2021-02-20 DIAGNOSIS — M810 Age-related osteoporosis without current pathological fracture: Secondary | ICD-10-CM | POA: Diagnosis not present

## 2021-02-20 DIAGNOSIS — I10 Essential (primary) hypertension: Secondary | ICD-10-CM | POA: Diagnosis not present

## 2021-02-20 DIAGNOSIS — E7800 Pure hypercholesterolemia, unspecified: Secondary | ICD-10-CM | POA: Diagnosis not present

## 2021-03-12 ENCOUNTER — Other Ambulatory Visit: Payer: Self-pay

## 2021-03-12 ENCOUNTER — Emergency Department (HOSPITAL_COMMUNITY): Payer: Medicare Other

## 2021-03-12 ENCOUNTER — Inpatient Hospital Stay (HOSPITAL_COMMUNITY)
Admission: EM | Admit: 2021-03-12 | Discharge: 2021-03-15 | DRG: 291 | Disposition: A | Discharge status: Home Health | Payer: Medicare Other | Attending: Family Medicine | Admitting: Family Medicine

## 2021-03-12 ENCOUNTER — Encounter (HOSPITAL_COMMUNITY): Payer: Self-pay

## 2021-03-12 DIAGNOSIS — I110 Hypertensive heart disease with heart failure: Secondary | ICD-10-CM | POA: Diagnosis not present

## 2021-03-12 DIAGNOSIS — Z79899 Other long term (current) drug therapy: Secondary | ICD-10-CM

## 2021-03-12 DIAGNOSIS — Z7901 Long term (current) use of anticoagulants: Secondary | ICD-10-CM

## 2021-03-12 DIAGNOSIS — F419 Anxiety disorder, unspecified: Secondary | ICD-10-CM | POA: Diagnosis present

## 2021-03-12 DIAGNOSIS — R011 Cardiac murmur, unspecified: Secondary | ICD-10-CM | POA: Diagnosis not present

## 2021-03-12 DIAGNOSIS — E876 Hypokalemia: Secondary | ICD-10-CM | POA: Diagnosis present

## 2021-03-12 DIAGNOSIS — H9193 Unspecified hearing loss, bilateral: Secondary | ICD-10-CM | POA: Diagnosis present

## 2021-03-12 DIAGNOSIS — J90 Pleural effusion, not elsewhere classified: Secondary | ICD-10-CM | POA: Diagnosis not present

## 2021-03-12 DIAGNOSIS — R778 Other specified abnormalities of plasma proteins: Secondary | ICD-10-CM | POA: Diagnosis not present

## 2021-03-12 DIAGNOSIS — Z20822 Contact with and (suspected) exposure to covid-19: Secondary | ICD-10-CM | POA: Diagnosis present

## 2021-03-12 DIAGNOSIS — Z885 Allergy status to narcotic agent status: Secondary | ICD-10-CM | POA: Diagnosis not present

## 2021-03-12 DIAGNOSIS — Z66 Do not resuscitate: Secondary | ICD-10-CM | POA: Diagnosis not present

## 2021-03-12 DIAGNOSIS — I480 Paroxysmal atrial fibrillation: Secondary | ICD-10-CM | POA: Diagnosis present

## 2021-03-12 DIAGNOSIS — I5021 Acute systolic (congestive) heart failure: Secondary | ICD-10-CM | POA: Diagnosis present

## 2021-03-12 DIAGNOSIS — I447 Left bundle-branch block, unspecified: Secondary | ICD-10-CM | POA: Diagnosis not present

## 2021-03-12 DIAGNOSIS — Z723 Lack of physical exercise: Secondary | ICD-10-CM | POA: Diagnosis not present

## 2021-03-12 DIAGNOSIS — I509 Heart failure, unspecified: Secondary | ICD-10-CM

## 2021-03-12 DIAGNOSIS — E785 Hyperlipidemia, unspecified: Secondary | ICD-10-CM | POA: Diagnosis present

## 2021-03-12 DIAGNOSIS — Z87891 Personal history of nicotine dependence: Secondary | ICD-10-CM

## 2021-03-12 DIAGNOSIS — I5022 Chronic systolic (congestive) heart failure: Secondary | ICD-10-CM | POA: Diagnosis present

## 2021-03-12 DIAGNOSIS — I10 Essential (primary) hypertension: Secondary | ICD-10-CM | POA: Diagnosis present

## 2021-03-12 DIAGNOSIS — I5023 Acute on chronic systolic (congestive) heart failure: Secondary | ICD-10-CM | POA: Diagnosis not present

## 2021-03-12 DIAGNOSIS — R0602 Shortness of breath: Secondary | ICD-10-CM | POA: Diagnosis not present

## 2021-03-12 DIAGNOSIS — J9811 Atelectasis: Secondary | ICD-10-CM | POA: Diagnosis not present

## 2021-03-12 HISTORY — DX: Heart failure, unspecified: I50.9

## 2021-03-12 LAB — CBC WITH DIFFERENTIAL/PLATELET
Abs Immature Granulocytes: 0.03 10*3/uL (ref 0.00–0.07)
Basophils Absolute: 0 10*3/uL (ref 0.0–0.1)
Basophils Relative: 1 %
Eosinophils Absolute: 0.1 10*3/uL (ref 0.0–0.5)
Eosinophils Relative: 1 %
HCT: 43.1 % (ref 36.0–46.0)
Hemoglobin: 14 g/dL (ref 12.0–15.0)
Immature Granulocytes: 0 %
Lymphocytes Relative: 14 %
Lymphs Abs: 1.2 10*3/uL (ref 0.7–4.0)
MCH: 30.3 pg (ref 26.0–34.0)
MCHC: 32.5 g/dL (ref 30.0–36.0)
MCV: 93.3 fL (ref 80.0–100.0)
Monocytes Absolute: 0.9 10*3/uL (ref 0.1–1.0)
Monocytes Relative: 11 %
Neutro Abs: 6 10*3/uL (ref 1.7–7.7)
Neutrophils Relative %: 73 %
Platelets: 291 10*3/uL (ref 150–400)
RBC: 4.62 MIL/uL (ref 3.87–5.11)
RDW: 14.3 % (ref 11.5–15.5)
WBC: 8.2 10*3/uL (ref 4.0–10.5)
nRBC: 0 % (ref 0.0–0.2)

## 2021-03-12 LAB — BASIC METABOLIC PANEL
Anion gap: 10 (ref 5–15)
BUN: 32 mg/dL — ABNORMAL HIGH (ref 8–23)
CO2: 26 mmol/L (ref 22–32)
Calcium: 8.7 mg/dL — ABNORMAL LOW (ref 8.9–10.3)
Chloride: 98 mmol/L (ref 98–111)
Creatinine, Ser: 0.81 mg/dL (ref 0.44–1.00)
GFR, Estimated: >60 mL/min (ref >60)
Glucose, Bld: 103 mg/dL — ABNORMAL HIGH (ref 70–99)
Potassium: 3.3 mmol/L — ABNORMAL LOW (ref 3.5–5.1)
Sodium: 134 mmol/L — ABNORMAL LOW (ref 135–145)

## 2021-03-12 LAB — RESP PANEL BY RT-PCR (FLU A&B, COVID) ARPGX2
Influenza A by PCR: NEGATIVE
Influenza B by PCR: NEGATIVE
SARS Coronavirus 2 by RT PCR: NEGATIVE

## 2021-03-12 LAB — BRAIN NATRIURETIC PEPTIDE: B Natriuretic Peptide: 1128.3 pg/mL — ABNORMAL HIGH (ref 0.0–100.0)

## 2021-03-12 LAB — TROPONIN I (HIGH SENSITIVITY): Troponin I (High Sensitivity): 31 ng/L — ABNORMAL HIGH (ref <18)

## 2021-03-12 MED ORDER — FUROSEMIDE 10 MG/ML IJ SOLN
40.0000 mg | Freq: Two times a day (BID) | INTRAMUSCULAR | Status: AC
  Administered 2021-03-13 – 2021-03-14 (×4): 40 mg via INTRAVENOUS
  Filled 2021-03-12 (×4): qty 4

## 2021-03-12 MED ORDER — LOSARTAN POTASSIUM 25 MG PO TABS
100.0000 mg | ORAL_TABLET | Freq: Every day | ORAL | Status: DC
  Administered 2021-03-13 – 2021-03-14 (×2): 100 mg via ORAL
  Filled 2021-03-12 (×2): qty 4

## 2021-03-12 MED ORDER — RIVAROXABAN 15 MG PO TABS
15.0000 mg | ORAL_TABLET | Freq: Every day | ORAL | Status: DC
  Administered 2021-03-13 – 2021-03-15 (×3): 15 mg via ORAL
  Filled 2021-03-12 (×3): qty 1

## 2021-03-12 MED ORDER — HYDROXYZINE HCL 25 MG PO TABS
12.5000 mg | ORAL_TABLET | Freq: Three times a day (TID) | ORAL | Status: DC | PRN
  Administered 2021-03-13: 25 mg via ORAL
  Filled 2021-03-12: qty 1

## 2021-03-12 MED ORDER — ONDANSETRON HCL 4 MG/2ML IJ SOLN
4.0000 mg | Freq: Four times a day (QID) | INTRAMUSCULAR | Status: DC | PRN
  Filled 2021-03-12: qty 2

## 2021-03-12 MED ORDER — BUPROPION HCL ER (XL) 150 MG PO TB24
150.0000 mg | ORAL_TABLET | Freq: Every day | ORAL | Status: DC
  Administered 2021-03-13 – 2021-03-15 (×3): 150 mg via ORAL
  Filled 2021-03-12 (×3): qty 1

## 2021-03-12 MED ORDER — ACETAMINOPHEN 325 MG PO TABS: 650.0000 mg | ORAL_TABLET | Freq: Four times a day (QID) | ORAL | Status: DC | PRN

## 2021-03-12 MED ORDER — ACETAMINOPHEN 650 MG RE SUPP: 650.0000 mg | Freq: Four times a day (QID) | RECTAL | Status: DC | PRN

## 2021-03-12 MED ORDER — ONDANSETRON HCL 4 MG PO TABS
4.0000 mg | ORAL_TABLET | Freq: Four times a day (QID) | ORAL | Status: DC | PRN
  Filled 2021-03-12: qty 1

## 2021-03-12 MED ORDER — ROSUVASTATIN CALCIUM 20 MG PO TABS
20.0000 mg | ORAL_TABLET | Freq: Every morning | ORAL | Status: DC
  Administered 2021-03-13 – 2021-03-15 (×3): 20 mg via ORAL
  Filled 2021-03-12 (×4): qty 1

## 2021-03-12 MED ORDER — SENNOSIDES-DOCUSATE SODIUM 8.6-50 MG PO TABS: 1.0000 | ORAL_TABLET | Freq: Every evening | ORAL | Status: DC | PRN

## 2021-03-12 MED ORDER — SODIUM CHLORIDE 0.9% FLUSH
3.0000 mL | Freq: Two times a day (BID) | INTRAVENOUS | Status: DC
  Administered 2021-03-13 – 2021-03-15 (×5): 3 mL via INTRAVENOUS

## 2021-03-12 MED ORDER — POTASSIUM CHLORIDE CRYS ER 20 MEQ PO TBCR
40.0000 meq | EXTENDED_RELEASE_TABLET | Freq: Once | ORAL | Status: AC
  Administered 2021-03-12: 40 meq via ORAL
  Filled 2021-03-12: qty 2

## 2021-03-12 MED ORDER — FUROSEMIDE 10 MG/ML IJ SOLN
40.0000 mg | Freq: Once | INTRAMUSCULAR | Status: AC
  Administered 2021-03-12: 40 mg via INTRAVENOUS
  Filled 2021-03-12: qty 4

## 2021-03-12 NOTE — ED Provider Notes (Signed)
Emergency Medicine Provider Triage Evaluation Note  Amy [name] , a 87 y.o. female  was evaluated in triage.  Pt complains of SOB.  Review of Systems  Positive: SOB, leg swelling, Dyspnea on exertion, cough Negative: Fever, n/v/d, cp  Physical Exam  BP 127/80 (BP Location: Left Arm)   Pulse 100   Temp 97.7 F (36.5 C) (Oral)   Resp (!) 22   Ht 5\' 1"  (1.549 m)   Wt 58.5 kg   SpO2 95%   BMI 24.37 kg/m  Gen:   Awake, no distress, hard of hearing Resp:  Mildly tachypneic, faint crackles to lung bases MSK:   Moves extremities without difficulty, 1+ pitting edema to BLE extending towards the ankle Other:  No JVD  Medical Decision Making  Medically screening exam initiated at 5:15 PM.  Appropriate orders placed.  Makaiah Terwilliger was informed that the remainder of the evaluation will be completed by another provider, this initial triage assessment does not replace that evaluation, and the importance of remaining in the ED until their evaluation is complete.  Progressive SOB with exertion.  No CP.  Recently noticed increase swelling in her feet bilaterally.     Domenic Moras, PA-C 03/12/21 1726    Lacretia Leigh, MD 03/14/21 [phone]

## 2021-03-12 NOTE — ED Notes (Signed)
Patient said she feels like she is starting to catch her breath now. Her son is now back in the room as well.

## 2021-03-12 NOTE — ED Notes (Signed)
Per Terrence Dupont, RN [phone] is ready for pt

## 2021-03-12 NOTE — H&P (Addendum)
History and Physical    Amy [name] SWN:[id number] DOB: [birth year] DOA: 03/12/2021  PCP: Antony Contras, MD  Patient coming from: Home  I have personally briefly reviewed patient's old medical records in [place]  Chief Complaint: Shortness of breath  HPI: Amy [name] is a 87 y.o. female with medical history significant for HTN, HLD, anxiety who presents to the ED for evaluation of shortness of breath.  Patient states 5 days ago she developed new onset of shortness of breath which has been progressive since onset.  Dyspnea is worse with exertion but now is also occurring with rest.  She has had paroxysmal nocturnal dyspnea and orthopnea.  She says she did see some swelling in both of her lower extremities.  She has had occasional cough productive of scant sputum.  Denies any similar episodes in the past.  ED Course:  Initial vitals showed BP 127/80, pulse 100, RR 22, temp 97.7 F, SPO2 95% on room air.  Labs show sodium 134, potassium 3.3, bicarb 26, BUN 32, creatinine 0.81, serum glucose 103, BNP 1128.3, high-sensitivity troponin I 31, WBC 8.2, hemoglobin 14.0, platelets 291,000.  SARS-CoV-2 PCR negative.  Influenza A/B PCR negative.  2 view chest x-ray shows vascular congestion with small bilateral pleural effusions.  Patient was given IV Lasix 40 mg and oral K 40 mEq.  The hospitalist service was consulted to admit for further evaluation and management.  Review of Systems: All systems reviewed and are negative except as documented in history of present illness above.   Past Medical History:  Diagnosis Date  . Hyperlipidemia   . Hypertension   . Rheumatic heart disease     Past Surgical History:  Procedure Laterality Date  . APPENDECTOMY      Social History:  reports that she has quit smoking. She has a 15.00 pack-year smoking history. She has never used smokeless tobacco. She reports that she does not drink alcohol and does not use drugs.  Allergies  Allergen  Reactions  . Lisinopril     Other reaction(s): cough  . Oxycodone Nausea And Vomiting and Other (See Comments)    Intolerance--"its like I was floating up on the ceiling"     Family History  Problem Relation Age of Onset  . Stroke Maternal Grandmother      Prior to Admission medications   Medication Sig Start Date End Date Taking? Authorizing Provider  apixaban (ELIQUIS) 2.5 MG TABS tablet Take 1 tablet (2.5 mg total) by mouth 2 (two) times daily. 06/06/20   Sherran Needs, NP  apixaban (ELIQUIS) 2.5 MG TABS tablet Take by mouth 2 (two) times daily.    [provider]  BIOTIN PO Take 1 tablet by mouth daily.    [provider]  buPROPion (WELLBUTRIN XL) 150 MG 24 hr tablet Take 150 mg by mouth daily. 04/07/18   [provider]  hydrochlorothiazide (HYDRODIURIL) 25 MG tablet TAKE 1 TABLET(25 MG) BY MOUTH DAILY 03/13/17   Tenna Delaine D, PA-C  hydrOXYzine (ATARAX/VISTARIL) 25 MG tablet 1/2-1 tablet  As needed for anxiety every 8 hours    [provider]  IRON PO Take 1 tablet by mouth daily.    [provider]  losartan (COZAAR) 100 MG tablet Take 1 tablet (100 mg total) by mouth daily. 03/13/17   Tenna Delaine D, PA-C  metoprolol succinate (TOPROL-XL) 25 MG 24 hr tablet Take 25 mg by mouth daily.    [provider]  Multiple Vitamins-Calcium (ONE-A-DAY WOMENS PO) Take  1 tablet by mouth daily.    [provider]    Physical Exam: Vitals:   03/12/21 1800 03/12/21 1900 03/12/21 2000 03/12/21 2100  BP: (!) 141/127 103/66 132/88 136/83  Pulse: (!) 58 (!) 58 92 94  Resp: 20 19 17 17   Temp:      TempSrc:      SpO2: 97% 97% 94% 95%  Weight:      Height:       Constitutional: Resting in bed with head elevated, NAD, calm, comfortable Eyes: PERRL, lids and conjunctivae normal ENMT: Mucous membranes are moist. Posterior pharynx clear of any exudate or lesions.Normal dentition.  Neck: normal, supple, no  masses. Respiratory: Bibasilar inspiratory crackles.  Normal respiratory effort. No accessory muscle use.  Cardiovascular: Regular rate and rhythm, no murmurs / rubs / gallops. No extremity edema. 2+ pedal pulses. Abdomen: no tenderness, no masses palpated. Bowel sounds positive.  Musculoskeletal: no clubbing / cyanosis. No joint deformity upper and lower extremities. Good ROM, no contractures. Normal muscle tone.  Skin: no rashes, lesions, ulcers. No induration Neurologic: CN 2-12 grossly intact. Sensation intact. Strength 5/5 in all 4.  Psychiatric: Normal judgment and insight. Alert and oriented x 3. Normal mood.   Labs on Admission: I have personally reviewed following labs and imaging studies  CBC: Recent Labs  Lab 03/12/21 1812  WBC 8.2  NEUTROABS 6.0  HGB 14.0  HCT 43.1  MCV 93.3  PLT 462   Basic Metabolic Panel: Recent Labs  Lab 03/12/21 1812  NA 134*  K 3.3*  CL 98  CO2 26  GLUCOSE 103*  BUN 32*  CREATININE 0.81  CALCIUM 8.7*   GFR: Estimated Creatinine Clearance: 41 mL/min (by C-G formula based on SCr of 0.81 mg/dL). Liver Function Tests: No results for input(s): AST, ALT, ALKPHOS, BILITOT, PROT, ALBUMIN in the last 168 hours. No results for input(s): LIPASE, AMYLASE in the last 168 hours. No results for input(s): AMMONIA in the last 168 hours. Coagulation Profile: No results for input(s): INR, PROTIME in the last 168 hours. Cardiac Enzymes: No results for input(s): CKTOTAL, CKMB, CKMBINDEX, TROPONINI in the last 168 hours. BNP (last 3 results) No results for input(s): PROBNP in the last 8760 hours. HbA1C: No results for input(s): HGBA1C in the last 72 hours. CBG: No results for input(s): GLUCAP in the last 168 hours. Lipid Profile: No results for input(s): CHOL, HDL, LDLCALC, TRIG, CHOLHDL, LDLDIRECT in the last 72 hours. Thyroid Function Tests: No results for input(s): TSH, T4TOTAL, FREET4, T3FREE, THYROIDAB in the last 72 hours. Anemia Panel: No  results for input(s): VITAMINB12, FOLATE, FERRITIN, TIBC, IRON, RETICCTPCT in the last 72 hours. Urine analysis:    Component Value Date/Time   COLORURINE YELLOW 05/15/2018 [place] 05/15/2018 1303   LABSPEC 1.013 05/15/2018 1303   PHURINE 8.0 05/15/2018 1303   GLUCOSEU NEGATIVE 05/15/2018 1303   HGBUR NEGATIVE 05/15/2018 1303   BILIRUBINUR NEGATIVE 05/15/2018 1303   BILIRUBINUR negative 10/24/2016 1106   KETONESUR NEGATIVE 05/15/2018 1303   PROTEINUR NEGATIVE 05/15/2018 1303   UROBILINOGEN 1.0 10/24/2016 1106   NITRITE NEGATIVE 05/15/2018 1303   LEUKOCYTESUR NEGATIVE 05/15/2018 1303    Radiological Exams on Admission: DG Chest 2 View  Result Date: 03/12/2021 CLINICAL DATA:  Shortness of breath EXAM: CHEST - 2 VIEW COMPARISON:  03/13/2015 FINDINGS: Small bilateral pleural effusions. Basilar consolidations. Obscured cardiomediastinal silhouette. Vascular congestion. No pneumothorax IMPRESSION: Vascular congestion with small bilateral effusions. Bibasilar airspace disease, probably atelectasis Electronically Signed  By: Donavan Foil M.D.   On: 03/12/2021 18:22    EKG: Personally reviewed.  Initial EKG showed question of atrial fibrillation with IVCD, possible atypical LBBB.  Repeat EKG showed sinus rhythm, first-degree AV block and again noted IVCD with question of atypical LBBB.  IVCD/? LBBB new when compared to prior.  Assessment/Plan Principal Problem:   Acute CHF (congestive heart failure) (HCC) Active Problems:   Hypertension   Hyperlipidemia   Paroxysmal atrial fibrillation (HCC)   Amy [name] is a 87 y.o. female with medical history significant for paroxysmal atrial fibrillation on Eliquis, HTN, HLD, anxiety who is admitted with likely new onset CHF.  New onset acute CHF: Presenting with DOE, orthopnea, PND, elevated BNP, CXR with evidence of vascular congestion and bilateral pleural effusions consistent with likely new onset acute CHF.  She has no known  prior history of CHF.  No prior TTE to specify systolic versus diastolic CHF. -Continue IV Lasix 40 mg twice daily -Obtain echocardiogram -Monitor on telemetry -Minimal troponin elevation likely demand ischemia due to CHF, repeat and trend if continuing to rise -Strict I/O's and daily weights  ?Paroxysmal atrial fibrillation: Per prior documentation was started on Eliquis 2.5 mg twice daily in August 2021 for new onset A. fib seen at PCP visit at that time.  Patient states she stopped Eliquis after 1 dose as it upset her stomach.  Her CHA2DS2-VASc score is at least 4 (5 with likely new CHF) which is an indication for anticoagulation to reduce the risk of stroke.  She has not had any obvious bleeding.  I discussed the risk versus benefits of resuming anticoagulation and she is willing to try again. -Start Xarelto 15 mg daily -Monitor on telemetry -Rate currently controlled, can add beta-blocker if needed when CHF more compensated  Hypokalemia: Supplement and follow on repeat labs.  Hypertension: Continue losartan.  Hyperlipidemia: Continue rosuvastatin.  Anxiety: Continue bupropion and Atarax prn.  DVT prophylaxis: Xarelto Code Status: DNR, confirmed with patient Family Communication: Discussed with patient, she has discussed with family Disposition Plan: From home and likely discharge to home pending clinical progress Consults called: None Level of care: Telemetry Admission status:  Status is: Observation  The patient remains OBS appropriate and will d/c before 2 midnights.  Dispo: The patient is from: Home              Anticipated d/c is to: Home              Patient currently is not medically stable to d/c.   Difficult to place patient No  Zada Finders MD [facility]  If 7PM-7AM, please contact night-coverage [email or web address]  03/12/2021, 10:55 PM

## 2021-03-12 NOTE — ED Notes (Signed)
Patients oxygen stayed steady at 96 percent while ambulating however her respirations went from 22 to 29. Patient stated she had a hard time catching her breath. Patient is now back in bed and is still having a hard time catching her breath. [name] was placed on patient as well.

## 2021-03-12 NOTE — ED Provider Notes (Signed)
[place] DEPT Provider Note   CSN: [id number] Arrival date & time: 03/12/21  1642     History Chief Complaint  Patient presents with  . Shortness of Breath    Amy [name] is a 87 y.o. female.  Patient with history of rheumatic heart disease and "heart murmur", hypertension, hyperlipidemia, afib (clinic visit 05/2020 started on eliquis), ?HFpEF (do not see previous echo) -- presents the emergency department today for evaluation of shortness of breath and swelling on her feet and ankles.  Patient states that her symptoms began to worsen about a week and a half ago.  She states that she has had difficulty lying flat due to shortness of breath and has had difficulty sleeping.  No cough or fever.  No chest pain.  She does not have a history of heart failure or previous history of heart attack.  No leg pain.  Symptoms exacerbated with exertion. The onset of this condition was acute. The course is constant.          Past Medical History:  Diagnosis Date  . Hyperlipidemia   . Hypertension   . Rheumatic heart disease     There are no problems to display for this patient.   Past Surgical History:  Procedure Laterality Date  . APPENDECTOMY       OB History   No obstetric history on file.     Family History  Problem Relation Age of Onset  . Stroke Maternal Grandmother     Social History   Tobacco Use  . Smoking status: Former Smoker    Packs/day: 0.50    Years: 30.00    Pack years: 15.00  . Smokeless tobacco: Never Used  Substance Use Topics  . Alcohol use: No  . Drug use: No    Home Medications Prior to Admission medications   Medication Sig Start Date End Date Taking? Authorizing Provider  apixaban (ELIQUIS) 2.5 MG TABS tablet Take 1 tablet (2.5 mg total) by mouth 2 (two) times daily. 06/06/20   Sherran Needs, NP  apixaban (ELIQUIS) 2.5 MG TABS tablet Take by mouth 2 (two) times daily.    [provider]  BIOTIN PO  Take 1 tablet by mouth daily.    [provider]  buPROPion (WELLBUTRIN XL) 150 MG 24 hr tablet Take 150 mg by mouth daily. 04/07/18   [provider]  hydrochlorothiazide (HYDRODIURIL) 25 MG tablet TAKE 1 TABLET(25 MG) BY MOUTH DAILY 03/13/17   Tenna Delaine D, PA-C  hydrOXYzine (ATARAX/VISTARIL) 25 MG tablet 1/2-1 tablet  As needed for anxiety every 8 hours    [provider]  IRON PO Take 1 tablet by mouth daily.    [provider]  losartan (COZAAR) 100 MG tablet Take 1 tablet (100 mg total) by mouth daily. 03/13/17   Tenna Delaine D, PA-C  metoprolol succinate (TOPROL-XL) 25 MG 24 hr tablet Take 25 mg by mouth daily.    [provider]  Multiple Vitamins-Calcium (ONE-A-DAY WOMENS PO) Take 1 tablet by mouth daily.    [provider]    Allergies    Oxycodone  Review of Systems   Review of Systems  Constitutional: Negative for diaphoresis and fever.  Eyes: Negative for redness.  Respiratory: Positive for shortness of breath. Negative for cough.   Cardiovascular: Positive for leg swelling. Negative for chest pain and palpitations.  Gastrointestinal: Negative for abdominal pain, nausea and vomiting.  Genitourinary: Negative for dysuria.  Musculoskeletal: Negative for back pain  and neck pain.  Skin: Negative for rash.  Neurological: Negative for syncope and light-headedness.  Psychiatric/Behavioral: The patient is not nervous/anxious.     Physical Exam Updated Vital Signs BP 127/80 (BP Location: Left Arm)   Pulse 100   Temp 97.7 F (36.5 C) (Oral)   Resp (!) 22   Ht 5\' 1"  (1.549 m)   Wt 58.5 kg   SpO2 95%   BMI 24.37 kg/m   Physical Exam Vitals and nursing note reviewed.  Constitutional:      Appearance: She is well-developed. She is not diaphoretic.  HENT:     Head: Normocephalic and atraumatic.     Mouth/Throat:     Mouth: Mucous membranes are not dry.  Eyes:     Conjunctiva/sclera: Conjunctivae normal.   Neck:     Vascular: Normal carotid pulses. No carotid bruit or JVD.     Trachea: Trachea normal. No tracheal deviation.  Cardiovascular:     Rate and Rhythm: Tachycardia present. Rhythm irregular.     Pulses: No decreased pulses.     Heart sounds: Normal heart sounds, S1 normal and S2 normal. No murmur heard.   Pulmonary:     Effort: Pulmonary effort is normal. No respiratory distress.     Breath sounds: Examination of the right-lower field reveals rales. Examination of the left-lower field reveals rales. Rales present. No wheezing.  Chest:     Chest wall: No tenderness.  Abdominal:     General: Bowel sounds are normal.     Palpations: Abdomen is soft.     Tenderness: There is no abdominal tenderness. There is no guarding or rebound.  Musculoskeletal:        General: Normal range of motion.     Cervical back: Normal range of motion and neck supple. No muscular tenderness.     Right lower leg: Edema present.     Left lower leg: Edema present.     Comments: 2+ pitting edema of feet, 1+ ankles. Symmetric bilaterally.   Skin:    General: Skin is warm and dry.     Coloration: Skin is not pale.  Neurological:     Mental Status: She is alert.     ED Results / Procedures / Treatments   Labs (all labs ordered are listed, but only abnormal results are displayed) Labs Reviewed  BASIC METABOLIC PANEL - Abnormal; Notable for the following components:      Result Value   Sodium 134 (*)    Potassium 3.3 (*)    Glucose, Bld 103 (*)    BUN 32 (*)    Calcium 8.7 (*)    All other components within normal limits  BRAIN NATRIURETIC PEPTIDE - Abnormal; Notable for the following components:   B Natriuretic Peptide 1,128.3 (*)    All other components within normal limits  TROPONIN I (HIGH SENSITIVITY) - Abnormal; Notable for the following components:   Troponin I (High Sensitivity) 31 (*)    All other components within normal limits  RESP PANEL BY RT-PCR (FLU A&B, COVID) [id number]  CBC WITH  DIFFERENTIAL/PLATELET    EKG EKG Interpretation  Date/Time:  Monday Mar 12 2021 17:29:21 EDT Ventricular Rate:  97 PR Interval:    QRS Duration: 142 QT Interval:  407 QTC Calculation: 517 R Axis:   -78 Text Interpretation: Atrial fibrillation IVCD, consider atypical LBBB 12 Lead; Mason-Likar changed from prior Confirmed by Davonna Belling [id number]) on 03/12/2021 6:06:53 PM   Radiology DG Chest 2 View  Result Date:  03/12/2021 CLINICAL DATA:  Shortness of breath EXAM: CHEST - 2 VIEW COMPARISON:  03/13/2015 FINDINGS: Small bilateral pleural effusions. Basilar consolidations. Obscured cardiomediastinal silhouette. Vascular congestion. No pneumothorax IMPRESSION: Vascular congestion with small bilateral effusions. Bibasilar airspace disease, probably atelectasis Electronically Signed   By: Donavan Foil M.D.   On: 03/12/2021 18:22    Procedures Procedures   Medications Ordered in ED Medications  furosemide (LASIX) injection 40 mg (has no administration in time range)  potassium chloride SA (KLOR-CON) CR tablet 40 mEq (has no administration in time range)    ED Course  I have reviewed the triage vital signs and the nursing notes.  Pertinent labs & imaging results that were available during my care of the patient were reviewed by me and considered in my medical decision making (see chart for details).  Patient seen and examined. Work-up initiated. EKG reviewed with Dr. Alvino Chapel, changed from prior. ? Afib. Exam concerning for new onset afib.   Vital signs reviewed and are as follows: BP 127/80 (BP Location: Left Arm)   Pulse 100   Temp 97.7 F (36.5 C) (Oral)   Resp (!) 22   Ht 5\' 1"  (1.549 m)   Wt 58.5 kg   SpO2 95%   BMI 24.37 kg/m   8:44 PM Work-up consistent with heart failure exacerbation.  BNP elevated.  Potassium slightly low.  Lasix ordered.  Oral potassium ordered.  Repeat EKG shows sinus rhythm.  9:29 PM Spoke with Dr. Posey Pronto who will see.     MDM  Rules/Calculators/A&P                          Admit.    Final Clinical Impression(s) / ED Diagnoses Final diagnoses:  Acute on chronic congestive heart failure, unspecified heart failure type [facility])    Rx / DC Orders ED Discharge Orders    None       Suann Larry 03/12/21 2129    Davonna Belling, MD 03/12/21 2348

## 2021-03-12 NOTE — ED Notes (Signed)
Sent chat message to Terrence Dupont, RN. Per [phone] secretary she is receiving this pt for admission

## 2021-03-12 NOTE — ED Triage Notes (Signed)
Pt c/o sob x 3 weeks w BLE edema. Pt states sob is worse when laying flat. Pt 95%ra.

## 2021-03-13 ENCOUNTER — Inpatient Hospital Stay (HOSPITAL_COMMUNITY): Payer: Medicare Other

## 2021-03-13 DIAGNOSIS — I480 Paroxysmal atrial fibrillation: Secondary | ICD-10-CM | POA: Diagnosis present

## 2021-03-13 DIAGNOSIS — Z87891 Personal history of nicotine dependence: Secondary | ICD-10-CM | POA: Diagnosis not present

## 2021-03-13 DIAGNOSIS — I110 Hypertensive heart disease with heart failure: Secondary | ICD-10-CM | POA: Diagnosis present

## 2021-03-13 DIAGNOSIS — F419 Anxiety disorder, unspecified: Secondary | ICD-10-CM | POA: Diagnosis present

## 2021-03-13 DIAGNOSIS — I5021 Acute systolic (congestive) heart failure: Secondary | ICD-10-CM

## 2021-03-13 DIAGNOSIS — I509 Heart failure, unspecified: Secondary | ICD-10-CM | POA: Diagnosis present

## 2021-03-13 DIAGNOSIS — E785 Hyperlipidemia, unspecified: Secondary | ICD-10-CM | POA: Diagnosis present

## 2021-03-13 DIAGNOSIS — I5022 Chronic systolic (congestive) heart failure: Secondary | ICD-10-CM | POA: Diagnosis present

## 2021-03-13 DIAGNOSIS — Z885 Allergy status to narcotic agent status: Secondary | ICD-10-CM | POA: Diagnosis not present

## 2021-03-13 DIAGNOSIS — R011 Cardiac murmur, unspecified: Secondary | ICD-10-CM | POA: Diagnosis present

## 2021-03-13 DIAGNOSIS — Z7901 Long term (current) use of anticoagulants: Secondary | ICD-10-CM | POA: Diagnosis not present

## 2021-03-13 DIAGNOSIS — Z79899 Other long term (current) drug therapy: Secondary | ICD-10-CM | POA: Diagnosis not present

## 2021-03-13 DIAGNOSIS — I447 Left bundle-branch block, unspecified: Secondary | ICD-10-CM | POA: Diagnosis present

## 2021-03-13 DIAGNOSIS — H9193 Unspecified hearing loss, bilateral: Secondary | ICD-10-CM | POA: Diagnosis present

## 2021-03-13 DIAGNOSIS — Z66 Do not resuscitate: Secondary | ICD-10-CM | POA: Diagnosis present

## 2021-03-13 DIAGNOSIS — Z20822 Contact with and (suspected) exposure to covid-19: Secondary | ICD-10-CM | POA: Diagnosis present

## 2021-03-13 DIAGNOSIS — R778 Other specified abnormalities of plasma proteins: Secondary | ICD-10-CM | POA: Diagnosis present

## 2021-03-13 DIAGNOSIS — E876 Hypokalemia: Secondary | ICD-10-CM | POA: Diagnosis present

## 2021-03-13 LAB — ECHOCARDIOGRAM COMPLETE
AR max vel: 1.16 cm2
AV Area VTI: 0.99 cm2
AV Area mean vel: 1.1 cm2
AV Mean grad: 4 mmHg
AV Peak grad: 8.1 mmHg
Ao pk vel: 1.42 m/s
Height: 61 in
S' Lateral: 3.92 cm
Weight: 2064 oz

## 2021-03-13 LAB — TSH: TSH: 3.367 u[IU]/mL (ref 0.350–4.500)

## 2021-03-13 LAB — CBC
HCT: 41.1 % (ref 36.0–46.0)
Hemoglobin: 13.3 g/dL (ref 12.0–15.0)
MCH: 30.1 pg (ref 26.0–34.0)
MCHC: 32.4 g/dL (ref 30.0–36.0)
MCV: 93 fL (ref 80.0–100.0)
Platelets: 244 10*3/uL (ref 150–400)
RBC: 4.42 MIL/uL (ref 3.87–5.11)
RDW: 14.1 % (ref 11.5–15.5)
WBC: 10.1 10*3/uL (ref 4.0–10.5)
nRBC: 0 % (ref 0.0–0.2)

## 2021-03-13 LAB — BASIC METABOLIC PANEL
Anion gap: 12 (ref 5–15)
BUN: 26 mg/dL — ABNORMAL HIGH (ref 8–23)
CO2: 23 mmol/L (ref 22–32)
Calcium: 8.7 mg/dL — ABNORMAL LOW (ref 8.9–10.3)
Chloride: 99 mmol/L (ref 98–111)
Creatinine, Ser: 0.65 mg/dL (ref 0.44–1.00)
GFR, Estimated: >60 mL/min (ref >60)
Glucose, Bld: 90 mg/dL (ref 70–99)
Potassium: 2.9 mmol/L — ABNORMAL LOW (ref 3.5–5.1)
Sodium: 134 mmol/L — ABNORMAL LOW (ref 135–145)

## 2021-03-13 LAB — TROPONIN I (HIGH SENSITIVITY): Troponin I (High Sensitivity): 33 ng/L — ABNORMAL HIGH (ref <18)

## 2021-03-13 LAB — MAGNESIUM: Magnesium: 2 mg/dL (ref 1.7–2.4)

## 2021-03-13 MED ORDER — POTASSIUM CHLORIDE CRYS ER 20 MEQ PO TBCR
40.0000 meq | EXTENDED_RELEASE_TABLET | Freq: Two times a day (BID) | ORAL | Status: AC
  Administered 2021-03-13 (×2): 40 meq via ORAL
  Filled 2021-03-13 (×2): qty 2

## 2021-03-13 NOTE — Plan of Care (Signed)
Initiated care plan 

## 2021-03-13 NOTE — Plan of Care (Signed)
  Problem: Clinical Measurements: Goal: Will remain free from infection Outcome: Progressing Goal: Cardiovascular complication will be avoided Outcome: Progressing   Problem: Education: Goal: Ability to verbalize understanding of medication therapies will improve Outcome: Progressing   Problem: Activity: Goal: Capacity to carry out activities will improve Outcome: Progressing

## 2021-03-13 NOTE — Progress Notes (Signed)
  Echocardiogram 2D Echocardiogram has been performed.  Merrie Roof F 03/13/2021, 3:32 PM

## 2021-03-13 NOTE — Progress Notes (Signed)
Progress Note    Amy [name]   LXB:[id number]  DOB: May 21, 1933  DOA: 03/12/2021     0  PCP: Antony Contras, MD  CC: SOB, swelling  Hospital Course: [name] is an 87 year old female with PMH hypertension, hyperlipidemia, anxiety who presented to the ER with shortness of breath.  Symptoms had been ongoing for approximately 1 week prior to admission and progressively worsening.  She also endorsed new swelling in her legs which is not typically present.  Her appetite has been poor lately and she has not been eating as much and thinks she has been losing weight from this despite the increased swelling in her legs.  She endorsed an occasional cough on admission however no fever or chills. She described orthopnea and PND.  She has not had similar symptoms or episodes of this in the past.  On admission she underwent CXR which showed vascular congestion and small bilateral pleural effusions.  BNP was also elevated 1128. Of note, she was diagnosed with atrial fibrillation in the past (Aug 2021), but stopped Eliquis after 1 dose due to GI upset.  Anticoagulation was again discussed on admission and patient was amenable for trial of Xarelto which was initiated.  She was admitted for IV diuresis with Lasix and echo for further cardiac work-up.  Interval History:  States that her breathing is improved when seen this morning.  Family present bedside. Reviewed plan for obtaining echo, continuing Xarelto and diuresis with Lasix.  ROS: Constitutional: negative for chills and fevers, Respiratory: negative for cough, Cardiovascular: negative for chest pain and Gastrointestinal: negative for abdominal pain  Assessment & Plan: New onset acute CHF Presenting with DOE, orthopnea, PND, elevated BNP, CXR with evidence of vascular congestion and bilateral pleural effusions consistent with likely new onset acute CHF.  She has no known prior history of CHF.  No prior TTE to specify systolic versus diastolic  CHF. -Continue IV Lasix 40 mg twice daily -Obtain echocardiogram -Monitor on telemetry -Minimal troponin elevation likely demand ischemia due to CHF, repeat and trend if continuing to rise -Strict I/O's and daily weights - depending on severity of echo results can then pursue inpatient vs outpatient cardiology follow up  Paroxysmal atrial fibrillation: Per prior documentation was started on Eliquis 2.5 mg twice daily in August 2021 for new onset A. fib seen at PCP visit at that time.  Patient states she stopped Eliquis after 1 dose as it upset her stomach.  Her CHA2DS2-VASc score is at least 4 (5 with likely new CHF) which is an indication for anticoagulation to reduce the risk of stroke.  She has not had any obvious bleeding.  I discussed the risk versus benefits of resuming anticoagulation and she is willing to try again. -Start Xarelto 15 mg daily -Monitor on telemetry -Rate currently controlled, can add beta-blocker if needed when CHF more compensated  Hypokalemia: Replete as needed  Hypertension: Continue losartan.  Hyperlipidemia: Continue rosuvastatin.  Anxiety: Continue bupropion and Atarax prn.  Old records reviewed in assessment of this patient  Antimicrobials:   DVT prophylaxis:  Rivaroxaban (XARELTO) tablet 15 mg   Code Status:   Code Status: DNR Family Communication: Family present bedside  Disposition Plan: Status is: Inpatient  Remains inpatient appropriate because:IV treatments appropriate due to intensity of illness or inability to take PO   Dispo: The patient is from: Home              Anticipated d/c is to: Home  Patient currently is not medically stable to d/c.   Difficult to place patient No  Risk of unplanned readmission score: Unplanned Admission- Pilot do not use: 11.17   Objective: Blood pressure (!) 123/48, pulse 99, temperature 98.1 F (36.7 C), resp. rate 17, height 5\' 1"  (1.549 m), weight 58.5 kg, SpO2 90 %.   Examination: General appearance: alert, cooperative and no distress Head: Normocephalic, without obvious abnormality, atraumatic Eyes: EOMI Lungs: No wheezing, scattered coarse sounds bilaterally Heart: regular rate and rhythm and S1, S2 normal Abdomen: normal findings: bowel sounds normal and soft, non-tender Extremities: 1+ LE edema Skin: mobility and turgor normal Neurologic: Grossly normal  Consultants:   n/a  Procedures:   N/a  Data Reviewed: I have personally reviewed following labs and imaging studies Results for orders placed or performed during the hospital encounter of 03/12/21 (from the past 24 hour(s))  Basic metabolic panel     Status: Abnormal   Collection Time: 03/12/21  6:12 PM  Result Value Ref Range   Sodium 134 (L) 135 - 145 mmol/L   Potassium 3.3 (L) 3.5 - 5.1 mmol/L   Chloride 98 98 - 111 mmol/L   CO2 26 22 - 32 mmol/L   Glucose, Bld 103 (H) 70 - 99 mg/dL   BUN 32 (H) 8 - 23 mg/dL   Creatinine, Ser 0.81 0.44 - 1.00 mg/dL   Calcium 8.7 (L) 8.9 - 10.3 mg/dL   GFR, Estimated >60 >60 mL/min   Anion gap 10 5 - 15  Brain natriuretic peptide     Status: Abnormal   Collection Time: 03/12/21  6:12 PM  Result Value Ref Range   B Natriuretic Peptide 1,128.3 (H) 0.0 - 100.0 pg/mL  CBC with Differential/Platelet     Status: None   Collection Time: 03/12/21  6:12 PM  Result Value Ref Range   WBC 8.2 4.0 - 10.5 K/uL   RBC 4.62 3.87 - 5.11 MIL/uL   Hemoglobin 14.0 12.0 - 15.0 g/dL   HCT 43.1 36.0 - 46.0 %   MCV 93.3 80.0 - 100.0 fL   MCH 30.3 26.0 - 34.0 pg   MCHC 32.5 30.0 - 36.0 g/dL   RDW 14.3 11.5 - 15.5 %   Platelets 291 150 - 400 K/uL   nRBC 0.0 0.0 - 0.2 %   Neutrophils Relative % 73 %   Neutro Abs 6.0 1.7 - 7.7 K/uL   Lymphocytes Relative 14 %   Lymphs Abs 1.2 0.7 - 4.0 K/uL   Monocytes Relative 11 %   Monocytes Absolute 0.9 0.1 - 1.0 K/uL   Eosinophils Relative 1 %   Eosinophils Absolute 0.1 0.0 - 0.5 K/uL   Basophils Relative 1 %    Basophils Absolute 0.0 0.0 - 0.1 K/uL   Immature Granulocytes 0 %   Abs Immature Granulocytes 0.03 0.00 - 0.07 K/uL  Troponin I (High Sensitivity)     Status: Abnormal   Collection Time: 03/12/21  6:12 PM  Result Value Ref Range   Troponin I (High Sensitivity) 31 (H) <18 ng/L  Resp Panel by RT-PCR (Flu A&B, Covid) Nasopharyngeal Swab     Status: None   Collection Time: 03/12/21  6:15 PM   Specimen: Nasopharyngeal Swab; Nasopharyngeal(NP) swabs in vial transport medium  Result Value Ref Range   SARS Coronavirus 2 by RT PCR NEGATIVE NEGATIVE   Influenza A by PCR NEGATIVE NEGATIVE   Influenza B by PCR NEGATIVE NEGATIVE  Troponin I (High Sensitivity)     Status: Abnormal  Collection Time: 03/12/21 10:57 PM  Result Value Ref Range   Troponin I (High Sensitivity) 33 (H) <18 ng/L  Magnesium     Status: None   Collection Time: 03/13/21  3:51 AM  Result Value Ref Range   Magnesium 2.0 1.7 - 2.4 mg/dL  CBC     Status: None   Collection Time: 03/13/21  3:51 AM  Result Value Ref Range   WBC 10.1 4.0 - 10.5 K/uL   RBC 4.42 3.87 - 5.11 MIL/uL   Hemoglobin 13.3 12.0 - 15.0 g/dL   HCT 41.1 36.0 - 46.0 %   MCV 93.0 80.0 - 100.0 fL   MCH 30.1 26.0 - 34.0 pg   MCHC 32.4 30.0 - 36.0 g/dL   RDW 14.1 11.5 - 15.5 %   Platelets 244 150 - 400 K/uL   nRBC 0.0 0.0 - 0.2 %  Basic metabolic panel     Status: Abnormal   Collection Time: 03/13/21  3:51 AM  Result Value Ref Range   Sodium 134 (L) 135 - 145 mmol/L   Potassium 2.9 (L) 3.5 - 5.1 mmol/L   Chloride 99 98 - 111 mmol/L   CO2 23 22 - 32 mmol/L   Glucose, Bld 90 70 - 99 mg/dL   BUN 26 (H) 8 - 23 mg/dL   Creatinine, Ser 0.65 0.44 - 1.00 mg/dL   Calcium 8.7 (L) 8.9 - 10.3 mg/dL   GFR, Estimated >60 >60 mL/min   Anion gap 12 5 - 15  TSH     Status: None   Collection Time: 03/13/21  3:51 AM  Result Value Ref Range   TSH 3.367 0.350 - 4.500 uIU/mL    Recent Results (from the past 240 hour(s))  Resp Panel by RT-PCR (Flu A&B, Covid)  Nasopharyngeal Swab     Status: None   Collection Time: 03/12/21  6:15 PM   Specimen: Nasopharyngeal Swab; Nasopharyngeal(NP) swabs in vial transport medium  Result Value Ref Range Status   SARS Coronavirus 2 by RT PCR NEGATIVE NEGATIVE Final    Comment: (NOTE) SARS-CoV-2 target nucleic acids are NOT DETECTED.  The SARS-CoV-2 RNA is generally detectable in upper respiratory specimens during the acute phase of infection. The lowest concentration of SARS-CoV-2 viral copies this assay can detect is 138 copies/mL. A negative result does not preclude SARS-Cov-2 infection and should not be used as the sole basis for treatment or other patient management decisions. A negative result may occur with  improper specimen collection/handling, submission of specimen other than nasopharyngeal swab, presence of viral mutation(s) within the areas targeted by this assay, and inadequate number of viral copies(<138 copies/mL). A negative result must be combined with clinical observations, patient history, and epidemiological information. The expected result is Negative.  Fact Sheet for Patients:  EntrepreneurPulse.com.au  Fact Sheet for Healthcare Providers:  IncredibleEmployment.be  This test is no t yet approved or cleared by the [place] FDA and  has been authorized for detection and/or diagnosis of SARS-CoV-2 by FDA under an Emergency Use Authorization (EUA). This EUA will remain  in effect (meaning this test can be used) for the duration of the COVID-19 declaration under Section 564(b)(1) of the Act, 21 U.S.C.section 360bbb-3(b)(1), unless the authorization is terminated  or revoked sooner.       Influenza A by PCR NEGATIVE NEGATIVE Final   Influenza B by PCR NEGATIVE NEGATIVE Final    Comment: (NOTE) The Xpert Xpress SARS-CoV-2/FLU/RSV plus assay is intended as an aid in the diagnosis of  influenza from Nasopharyngeal swab specimens and should not be  used as a sole basis for treatment. Nasal washings and aspirates are unacceptable for Xpert Xpress SARS-CoV-2/FLU/RSV testing.  Fact Sheet for Patients: EntrepreneurPulse.com.au  Fact Sheet for Healthcare Providers: IncredibleEmployment.be  This test is not yet approved or cleared by the [place] FDA and has been authorized for detection and/or diagnosis of SARS-CoV-2 by FDA under an Emergency Use Authorization (EUA). This EUA will remain in effect (meaning this test can be used) for the duration of the COVID-19 declaration under Section 564(b)(1) of the Act, 21 U.S.C. section 360bbb-3(b)(1), unless the authorization is terminated or revoked.  Performed at [facility], [place] [address], [place], [place] [address]      Radiology Studies: DG Chest 2 View  Result Date: 03/12/2021 CLINICAL DATA:  Shortness of breath EXAM: CHEST - 2 VIEW COMPARISON:  03/13/2015 FINDINGS: Small bilateral pleural effusions. Basilar consolidations. Obscured cardiomediastinal silhouette. Vascular congestion. No pneumothorax IMPRESSION: Vascular congestion with small bilateral effusions. Bibasilar airspace disease, probably atelectasis Electronically Signed   By: Donavan Foil M.D.   On: 03/12/2021 18:22   DG Chest 2 View  Final Result      Scheduled Meds: . buPROPion  150 mg Oral Daily  . furosemide  40 mg Intravenous Q12H  . losartan  100 mg Oral Daily  . potassium chloride  40 mEq Oral BID  . Rivaroxaban  15 mg Oral Q supper  . rosuvastatin  20 mg Oral q morning  . sodium chloride flush  3 mL Intravenous Q12H   PRN Meds: acetaminophen **OR** acetaminophen, hydrOXYzine, ondansetron **OR** ondansetron (ZOFRAN) IV, senna-docusate Continuous Infusions:   LOS: 0 days  Time spent: Greater than 50% of the 35 minute visit was spent in counseling/coordination of care for the patient as laid out in the A&P.   Dwyane Dee, MD [facility] 03/13/2021, 1:35 PM

## 2021-03-13 NOTE — Discharge Instructions (Signed)

## 2021-03-13 NOTE — Plan of Care (Signed)

## 2021-03-14 LAB — CBC WITH DIFFERENTIAL/PLATELET
Abs Immature Granulocytes: 0.04 10*3/uL (ref 0.00–0.07)
Basophils Absolute: 0.1 10*3/uL (ref 0.0–0.1)
Basophils Relative: 1 %
Eosinophils Absolute: 0.1 10*3/uL (ref 0.0–0.5)
Eosinophils Relative: 1 %
HCT: 41 % (ref 36.0–46.0)
Hemoglobin: 13.5 g/dL (ref 12.0–15.0)
Immature Granulocytes: 0 %
Lymphocytes Relative: 14 %
Lymphs Abs: 1.2 10*3/uL (ref 0.7–4.0)
MCH: 30 pg (ref 26.0–34.0)
MCHC: 32.9 g/dL (ref 30.0–36.0)
MCV: 91.1 fL (ref 80.0–100.0)
Monocytes Absolute: 1.4 10*3/uL — ABNORMAL HIGH (ref 0.1–1.0)
Monocytes Relative: 15 %
Neutro Abs: 6.2 10*3/uL (ref 1.7–7.7)
Neutrophils Relative %: 69 %
Platelets: 251 10*3/uL (ref 150–400)
RBC: 4.5 MIL/uL (ref 3.87–5.11)
RDW: 14 % (ref 11.5–15.5)
WBC: 9 10*3/uL (ref 4.0–10.5)
nRBC: 0 % (ref 0.0–0.2)

## 2021-03-14 LAB — BASIC METABOLIC PANEL
Anion gap: 8 (ref 5–15)
BUN: 23 mg/dL (ref 8–23)
CO2: 27 mmol/L (ref 22–32)
Calcium: 8.4 mg/dL — ABNORMAL LOW (ref 8.9–10.3)
Chloride: 99 mmol/L (ref 98–111)
Creatinine, Ser: 0.89 mg/dL (ref 0.44–1.00)
GFR, Estimated: >60 mL/min (ref >60)
Glucose, Bld: 99 mg/dL (ref 70–99)
Potassium: 3.4 mmol/L — ABNORMAL LOW (ref 3.5–5.1)
Sodium: 134 mmol/L — ABNORMAL LOW (ref 135–145)

## 2021-03-14 LAB — MAGNESIUM: Magnesium: 1.8 mg/dL (ref 1.7–2.4)

## 2021-03-14 MED ORDER — CARVEDILOL 6.25 MG PO TABS
6.2500 mg | ORAL_TABLET | Freq: Two times a day (BID) | ORAL | Status: DC
  Administered 2021-03-14 – 2021-03-15 (×3): 6.25 mg via ORAL
  Filled 2021-03-14 (×3): qty 1

## 2021-03-14 MED ORDER — SACUBITRIL-VALSARTAN 24-26 MG PO TABS
1.0000 | ORAL_TABLET | Freq: Two times a day (BID) | ORAL | Status: DC
  Administered 2021-03-15: 1 via ORAL
  Filled 2021-03-14: qty 1

## 2021-03-14 MED ORDER — SPIRONOLACTONE 12.5 MG HALF TABLET
12.5000 mg | ORAL_TABLET | Freq: Every day | ORAL | Status: DC
  Administered 2021-03-14 – 2021-03-15 (×2): 12.5 mg via ORAL
  Filled 2021-03-14 (×2): qty 1

## 2021-03-14 MED ORDER — AMIODARONE HCL 200 MG PO TABS
200.0000 mg | ORAL_TABLET | Freq: Two times a day (BID) | ORAL | Status: DC
  Administered 2021-03-14 – 2021-03-15 (×3): 200 mg via ORAL
  Filled 2021-03-14 (×3): qty 1

## 2021-03-14 MED ORDER — AMIODARONE HCL 200 MG PO TABS: 200.0000 mg | ORAL_TABLET | Freq: Every day | ORAL | Status: DC

## 2021-03-14 NOTE — Progress Notes (Signed)
Assisted patient up to bedside commode, pt admitted to feeling quite anxious when she is away from home and was anxious to the point of feeling like she could not urinate.  RN assure the patient that we were not in a hurry, to try to relax and RN turned on the water hoping the noise would help.  She is quite distracted and mentioned a bed alarm going off as well as flight of thought about various happenings of the day.  She seemed a bit confused by what day/time it was and what doctors she had talked to since admission.  RN was at computer looking at the notes in her chart to attempt to figure out who and what she was talking about when she became distracted again by a telephone ringing (it was CCMD attempting to call this RN about her HR), RN told her it was a telephone out in the hallway but that she needed to concentrate on the task at hand  when she became quite upset raising her voice saying to the RN "why are you being so mean. I really liked you yesterday I looked forward to seeing you today I even told my son all about you. I can leave here anytime I want to, Im not a prisoner here" RN told patient she was not trying to to be mean and apologized for the tone in which I spoke when trying to get her on task.   At time of CCMD tele report PT was SR 62, 1degree BBB with PVC's .  Activity, anxiety about feeling like she couldn't urinate may have caused pt to go into an Afib rhythm?  Once pt back in bed RN noted HR as high as 144 on hallway monitor when going to get her fresh ice. Of note RN is not sure that the patient has slept much more than a hr at a time since admission due to getting lasix.

## 2021-03-14 NOTE — Progress Notes (Signed)
Chaplain engaged in an initial visit with Amy [name].  She expressed that she was feeling really tired today, more than she has in previous days.  Chaplain affirmed her need for rest.  Chaplain also was able to spend some time talking about completing an Advanced Directive as she requested more information about it.  Amy [name] is interested in assigning her son as her healthcare POA.  She has six children with most of them living out of state and wants for her son who is closer in proximity to be her healthcare agent.  Chaplain provided some education around completing an AD, as well as discussing if there was a need for her to do it.  She stated she would like to go over it with him when he arrives today.  Chaplain let her know how to contact her for more education and completion around the document.    Chaplain learned that Amy [name] lives alone and that her husband passed about 4 years ago in May.  She shared that she is well supported.  Chaplain also offered support, presence, education, and listening.    03/14/21 1000  Clinical Encounter Type  Visited With Patient  Visit Type Initial;Social support

## 2021-03-14 NOTE — Consult Note (Signed)
Cardiology Consultation:  Patient ID: Amy [name] MRN: [id number]; DOB: April 09, 1933  Admit date: 03/12/2021 Date of Consult: 03/14/2021  Primary Care Provider: Antony Contras, MD Primary Cardiologist: None  Primary Electrophysiologist:  None  Patient Profile:  Amy [name] is a 87 y.o. female with a hx of hypertension, hyperlipidemia who is being seen today for the evaluation of congestive heart failure at the request of Fayrene Helper, MD.  History of Present Illness:  Amy [name] presents with 1 to 2 weeks of progressively worsening shortness of breath.  She is also had increased lower extremity edema.  She was admitted to the hospital on 03/12/2021 and found to have new onset systolic heart failure.  She reports a long history of hypertension hyperlipidemia.  No history of cancer.  No illicit drug use is reported.  I performed the interview with her son in the room.  She does report a history of rheumatic fever as a child however she has no evidence of rheumatic heart disease on echocardiogram.  She apparently was diagnosed with atrial fibrillation by her primary care physician.  She reports this was several months ago but she was not able to tolerate Eliquis and she stopped it.  On arrival to [facility] emergency room she was found to be in A. fib.  She also had a chest x-ray consistent with pulmonary edema.  BNP was elevated at 1128.  Troponins were minimally elevated and flat at 31 and 33 on repeat.  Thyroid studies were normal with a TSH of 3.3.  Serum creatinine is normal.  She has been hypokalemic here.  Her EKG demonstrates currently sinus rhythm with a left bundle branch block.  QRS is 140 ms.  An echocardiogram was obtained yesterday demonstrates an LVEF of 20-25% with global hypokinesis.  She has had good urine output since being here.  She has net -4.2 L.  Weights are down nearly 1 kg.  She still has evidence of elevated JVD and crackles at the lung bases.  I suspect she needs 1 more day of IV  diuresis.  Hospital medicine has her on losartan.  She has been started on Xarelto and is tolerating this well.  We had a long discussion about her diagnosis of systolic heart failure and A. fib.  We also discussed her goals of care.  She is currently DNR.  She expresses a desire for medications.  I do agree with this given her advanced age and frailty.  She apparently is fairly active and still lives alone.  She does all activities of daily living.  She still cooks and cleans.  She is no longer driving.  Her son does corroborate this.  There is no memory impairment or concerns for dementia per her report or his report.  Heart Pathway Score:       Past Medical History: Past Medical History:  Diagnosis Date  . Hyperlipidemia   . Hypertension   . Rheumatic heart disease     Past Surgical History: Past Surgical History:  Procedure Laterality Date  . APPENDECTOMY       Home Medications:  Prior to Admission medications   Medication Sig Start Date End Date Taking? Authorizing Provider  buPROPion (WELLBUTRIN XL) 150 MG 24 hr tablet Take 150 mg by mouth daily. 04/07/18  Yes [provider]  hydrOXYzine (ATARAX/VISTARIL) 25 MG tablet Take 12.5-25 mg by mouth every 8 (eight) hours as needed for anxiety.   Yes [provider]  losartan (COZAAR) 100 MG tablet Take 1 tablet (  100 mg total) by mouth daily. 03/13/17  Yes Timmothy Euler, [place] D, PA-C  rosuvastatin (CRESTOR) 20 MG tablet Take 20 mg by mouth every morning. 02/21/21  Yes [provider]  IRON PO Take 1 tablet by mouth daily.    [provider]    Inpatient Medications: Scheduled Meds: . amiodarone  200 mg Oral BID   Followed by  . [START ON 03/18/2021] amiodarone  200 mg Oral Daily  . buPROPion  150 mg Oral Daily  . carvedilol  6.25 mg Oral BID WC  . furosemide  40 mg Intravenous Q12H  . Rivaroxaban  15 mg Oral Q supper  . rosuvastatin  20 mg Oral q morning  . [START ON 03/15/2021] sacubitril-valsartan   1 tablet Oral BID  . sodium chloride flush  3 mL Intravenous Q12H  . spironolactone  12.5 mg Oral Daily   Continuous Infusions:  PRN Meds: acetaminophen **OR** acetaminophen, hydrOXYzine, ondansetron **OR** ondansetron (ZOFRAN) IV, senna-docusate  Allergies:    Allergies  Allergen Reactions  . Lisinopril     Other reaction(s): cough  . Oxycodone Nausea And Vomiting and Other (See Comments)    Intolerance--"its like I was floating up on the ceiling"     Social History:   Social History   Socioeconomic History  . Marital status: Married    Spouse name: Not on file  . Number of children: Not on file  . Years of education: Not on file  . Highest education level: Not on file  Occupational History  . Not on file  Tobacco Use  . Smoking status: Former Smoker    Packs/day: 0.50    Years: 30.00    Pack years: 15.00  . Smokeless tobacco: Never Used  Substance and Sexual Activity  . Alcohol use: No  . Drug use: No  . Sexual activity: Not on file  Other Topics Concern  . Not on file  Social History Narrative  . Not on file   Social Determinants of Health   Financial Resource Strain: Not on file  Food Insecurity: Not on file  Transportation Needs: Not on file  Physical Activity: Not on file  Stress: Not on file  Social Connections: Not on file  Intimate Partner Violence: Not on file     Family History:    Family History  Problem Relation Age of Onset  . Stroke Maternal Grandmother      ROS:  All other ROS reviewed and negative. Pertinent positives noted in the HPI.     Physical Exam/Data:   Vitals:   03/13/21 1303 03/13/21 2009 03/14/21 0500 03/14/21 0504  BP: (!) 123/48 138/83  137/71  Pulse: 99 (!) 109  93  Resp: 17 18  16   Temp: 98.1 F (36.7 C) 97.7 F (36.5 C)  97.6 F (36.4 C)  TempSrc:  Oral  Oral  SpO2: 90% 97%  90%  Weight:   57.7 kg   Height:         Intake/Output Summary (Last 24 hours) at 03/14/2021 1200 Last data filed at 03/14/2021  5852 Gross per 24 hour  Intake --  Output 1550 ml  Net -1550 ml    Last 3 Weights 03/14/2021 03/12/2021 06/06/2020  Weight (lbs) 127 lb 3.3 oz 129 lb 125 lb  Weight (kg) 57.7 kg 58.514 kg 56.7 kg    Body mass index is 24.04 kg/m.  General: Well nourished, well developed, in no acute distress Head: Atraumatic, normal size  Eyes: PEERLA, EOMI  Neck: Supple,  no JVD Endocrine: No thryomegaly Cardiac: Normal S1, S2; RRR; no murmurs, rubs, or gallops Lungs: Clear to auscultation bilaterally, no wheezing, rhonchi or rales  Abd: Soft, nontender, no hepatomegaly  Ext: No edema, pulses 2+ Musculoskeletal: No deformities, BUE and BLE strength normal and equal Skin: Warm and dry, no rashes   Neuro: Alert and oriented to person, place, time, and situation, CNII-XII grossly intact, no focal deficits  Psych: Normal mood and affect   EKG:  The EKG was personally reviewed and demonstrates: Sinus rhythm heart rate 92, left bundle branch block, QRS 140 ms, first-degree AV block Telemetry:  Telemetry was personally reviewed and demonstrates: Sinus rhythm with intermittent episodes of A. fib  Relevant CV Studies: TTE 03/13/2021 1. Limited study Apical images incomplete (apical 2 and 3 chamber not  done). Overall, based on previous images LVEF is severely depressed at  approximately 20 to 25%. Left ventricular diastolic parameters are  indeterminate.  2. Right ventricular systolic function is moderately reduced. The right  ventricular size is normal.  3. Left atrial size was mildly dilated.  4. Right atrial size was mildly dilated.  5. The mitral valve is normal in structure. Mild mitral valve  regurgitation.  6. The aortic valve is normal in structure. Aortic valve regurgitation is  not visualized.   Laboratory Data: High Sensitivity Troponin:   Recent Labs  Lab 03/12/21 1812 03/12/21 2257  TROPONINIHS 31* 33*     Cardiac EnzymesNo results for input(s): TROPONINI in the last 168  hours. No results for input(s): TROPIPOC in the last 168 hours.  Chemistry Recent Labs  Lab 03/12/21 1812 03/13/21 0351 03/14/21 0333  NA 134* 134* 134*  K 3.3* 2.9* 3.4*  CL 98 99 99  CO2 26 23 27   GLUCOSE 103* 90 99  BUN 32* 26* 23  CREATININE 0.81 0.65 0.89  CALCIUM 8.7* 8.7* 8.4*  GFRNONAA >60 >60 >60  ANIONGAP 10 12 8     No results for input(s): PROT, ALBUMIN, AST, ALT, ALKPHOS, BILITOT in the last 168 hours. Hematology Recent Labs  Lab 03/12/21 1812 03/13/21 0351 03/14/21 0333  WBC 8.2 10.1 9.0  RBC 4.62 4.42 4.50  HGB 14.0 13.3 13.5  HCT 43.1 41.1 41.0  MCV 93.3 93.0 91.1  MCH 30.3 30.1 30.0  MCHC 32.5 32.4 32.9  RDW 14.3 14.1 14.0  PLT 291 244 251   BNP Recent Labs  Lab 03/12/21 1812  BNP 1,128.3*    DDimer No results for input(s): DDIMER in the last 168 hours.  Radiology/Studies:  DG Chest 2 View  Result Date: 03/12/2021 CLINICAL DATA:  Shortness of breath EXAM: CHEST - 2 VIEW COMPARISON:  03/13/2015 FINDINGS: Small bilateral pleural effusions. Basilar consolidations. Obscured cardiomediastinal silhouette. Vascular congestion. No pneumothorax IMPRESSION: Vascular congestion with small bilateral effusions. Bibasilar airspace disease, probably atelectasis Electronically Signed   By: Donavan Foil M.D.   On: 03/12/2021 18:22   ECHOCARDIOGRAM COMPLETE  Result Date: 03/13/2021    ECHOCARDIOGRAM REPORT   Patient Name:   CHERELLE RENNER Date of Exam: 03/13/2021 Medical Rec #:  [id number]   Height:       61.0 in Accession #:    [id number]  Weight:       129.0 lb Date of Birth:  [birth year]   BSA:          1.568 m Patient Age:    79 years    BP:           123/48 mmHg Patient Gender: F  HR:           97 bpm. Exam Location:  Inpatient Procedure: 2D Echo, Cardiac Doppler and Color Doppler Indications:   Congestive Heart Failure I50.9  History:       Patient has no prior history of Echocardiogram examinations.                Nausea.  Sonographer:   Merrie Roof  Referring      [phone] [place] Phys: IMPRESSIONS  1. Limited study Apical images incomplete (apical 2 and 3 chamber not done). Overall, based on previous images LVEF is severely depressed at approximately 20 to 25%. Left ventricular diastolic parameters are indeterminate.  2. Right ventricular systolic function is moderately reduced. The right ventricular size is normal.  3. Left atrial size was mildly dilated.  4. Right atrial size was mildly dilated.  5. The mitral valve is normal in structure. Mild mitral valve regurgitation.  6. The aortic valve is normal in structure. Aortic valve regurgitation is not visualized. FINDINGS  Left Ventricle: The left ventricular internal cavity size was normal in size. Limite studya. Apical images incomplete (apical 2 and 3 chamber views not done. Overall, based on ther images LVEF is severely depressed at appoximately 20 to 25%. There is no  left ventricular hypertrophy. Left ventricular diastolic parameters are indeterminate. Right Ventricle: The right ventricular size is normal. Right vetricular wall thickness was not assessed. Right ventricular systolic function is moderately reduced. Left Atrium: Left atrial size was mildly dilated. Right Atrium: Right atrial size was mildly dilated. Pericardium: Trivial pericardial effusion is present. Mitral Valve: The mitral valve is normal in structure. Mild mitral valve regurgitation. Tricuspid Valve: The tricuspid valve is normal in structure. Tricuspid valve regurgitation is mild. Aortic Valve: The aortic valve is normal in structure. Aortic valve regurgitation is not visualized. Aortic valve mean gradient measures 4.0 mmHg. Aortic valve peak gradient measures 8.1 mmHg. Aortic valve area, by VTI measures 0.99 cm. Pulmonic Valve: The pulmonic valve was not well visualized. Pulmonic valve regurgitation is not visualized. Aorta: The aortic root is normal in size and structure. Venous: The inferior vena cava was not well  visualized. IAS/Shunts: The interatrial septum was not assessed.  LEFT VENTRICLE PLAX 2D LVIDd:         4.61 cm LVIDs:         3.92 cm LV PW:         0.90 cm LV IVS:        0.90 cm LVOT diam:     1.80 cm LV SV:         26 LV SV Index:   17 LVOT Area:     2.54 cm  LEFT ATRIUM           Index       RIGHT ATRIUM           Index LA diam:      3.40 cm 2.17 cm/m  RA Area:     16.50 cm LA Vol (A4C): 60.7 ml 38.72 ml/m RA Volume:   45.60 ml  29.09 ml/m  AORTIC VALVE AV Area (Vmax):    1.16 cm AV Area (Vmean):   1.10 cm AV Area (VTI):     0.99 cm AV Vmax:           142.00 cm/s AV Vmean:          91.400 cm/s AV VTI:            0.263  m AV Peak Grad:      8.1 mmHg AV Mean Grad:      4.0 mmHg LVOT Vmax:         65.00 cm/s LVOT Vmean:        39.500 cm/s LVOT VTI:          0.102 m LVOT/AV VTI ratio: 0.39  AORTA Ao Root diam: 2.80 cm  SHUNTS Systemic VTI:  0.10 m Systemic Diam: 1.80 cm Dorris Carnes MD Electronically signed by Dorris Carnes MD Signature Date/Time: 03/13/2021/5:26:02 PM    Final     Assessment and Plan:   1.  New onset systolic heart failure, EF 20-25% -Unclear etiology.  Thyroid studies are normal.  No history of drug abuse or alcohol abuse.  She has no chest pain or concerning symptoms for underlying obstructive CAD.  Troponins actually are minimally elevated and flat.  This is reassuring. -She has a history of A. fib and is unaware of this.  She denies any palpitations.  I suspect the etiology of her cardiomyopathy is arrhythmia related.  She is had rapid A. fib here but does not feel it.  She also now has a left bundle branch block.  In the setting of A. fib with a left bundle branch block and significant LV dyssynchrony this could easily explain her cardiomyopathy. -We had a discussion about her goals of care.  She expresses desire for less aggressive measures.  She is okay with medications but will be reluctant to do any invasive cardiac procedures.  Therefore, I have recommended a rhythm control  strategy with amiodarone and further titration of guideline directed medical therapy for cardiomyopathy.  We could pursue a stress test as an outpatient. -We will go ahead and start her on Coreg 6.25 mg twice daily.  Transition off losartan and start Entresto 24-26 mg twice daily tomorrow.  I will go ahead and add Aldactone 12.5 mg daily.  This will help with her hypokalemia. -She could be considered for an SGLT2 inhibitor.  She is reluctant to take many medications.  We can add this in the outpatient setting. -Would recommend 1 more day of IV 40 mg twice daily.  Transition to oral diuretic tomorrow.  Anticipate discharge tomorrow. -She is having paroxysmal atrial fibrillation.  She has low EF.  She has limited options for rhythm control medications.  We will go ahead and load her with amiodarone (a soft load) 200 mg twice daily for 7 days and then 200 mg daily.  If we run into issues with her rates we can start digoxin. -She has no evidence of infiltrative cardiomyopathy.  Walls are normal size.  I suspect this is just related to A. Fib. -I favor 3 to 6 months guideline directed medical therapy.  We will then recheck her echocardiogram.  We will pursue a conservative approach.  2.  Paroxysmal atrial fibrillation -Troubles with Eliquis. CHADSVASC=5 (age, hypertension, CHF, female).  Anticoagulation is indicated.  She is on Xarelto and tolerating this well.  We will continue this. -We will continue with amiodarone load as above.  I would recommend a rhythm control strategy as I think this is the etiology for heart failure.  We will see how she does.  For questions or updates, please contact [place] Please consult [email or web address] for contact info under   Signed, [place] T. Audie Box, MD, [place]  03/14/2021 12:00 PM

## 2021-03-14 NOTE — Progress Notes (Signed)
PROGRESS NOTE    Amy [name]  JQB:[id number] DOB: [birth year] DOA: 03/12/2021 PCP: Antony Contras, MD   Chief Complaint  Patient presents with  . Shortness of Breath   Brief Narrative:  Amy [name] is an 87 year old female with PMH hypertension, hyperlipidemia, anxiety who presented to the ER with shortness of breath.  Symptoms had been ongoing for approximately 1 week prior to admission and progressively worsening.  She also endorsed new swelling in her legs which is not typically present.  Her appetite has been poor lately and she has not been eating as much and thinks she has been losing weight from this despite the increased swelling in her legs.  She endorsed an occasional cough on admission however no fever or chills. She described orthopnea and PND.  She has not had similar symptoms or episodes of this in the past.  On admission she underwent CXR which showed vascular congestion and small bilateral pleural effusions.  BNP was also elevated 1128. Of note, she was diagnosed with atrial fibrillation in the past (Aug 2021), but stopped Eliquis after 1 dose due to GI upset.  Anticoagulation was again discussed on admission and patient was amenable for trial of Xarelto which was initiated.  She was admitted for IV diuresis with Lasix and echo for further cardiac work-up.  Assessment & Plan:   Principal Problem:   Acute CHF (congestive heart failure) (HCC) Active Problems:   Hypertension   Hyperlipidemia   Paroxysmal atrial fibrillation (HCC)   Acute systolic CHF (congestive heart failure) ([place])  New onset acute CHF Presenting with DOE, orthopnea, PND, elevated BNP, CXR with evidence of vascular congestion and bilateral pleural effusions consistent with likely new onset acute CHF. She has no known prior history of CHF.  - Echo 5/17 with EF 20-25%, moderately reduced RVSF (see report) - Cardiology c/s, appreciate recs -> unclear etiology, ? Atrial fibrillation with LBBB - recommending  guideline directed medical therapy with rhythm control strategy for atrial fibrillation with amiodarone.  Consider stress test outpatient.  Lasix x1 more dose today, transition to oral diuretic in AM. - coreg 6.25 mg BID, start entresto tomorrow, aldactone 12.5 mg daily - consider SGLT2 inhibitor outpatient - 3-6 months guideline directed medical therapy followed by repeat US  Paroxysmal atrial fibrillation: Per prior documentation was started on Eliquis 2.5 mg twice daily in August 2021 for new onset Amy [name]. fib seen at PCP visit at that time. Patient states she stopped Eliquis after 1 dose as it upset her stomach. Her CHA2DS2-VASc score is at least 5 which is an indication for anticoagulation to reduce the risk of stroke. She has not had any obvious bleeding. I discussed the risk versus benefits of resuming anticoagulation and she is willing to try again. - Xarelto 15 mg daily -amiodarone per cardiology -Monitor on telemetry  Hypokalemia: Replace as needed  Hypertension: Transition to entresto from ARB, follow Aldactone, coreg Follow BP  Hyperlipidemia: Continue rosuvastatin.  Anxiety: Continue bupropion and Ataraxprn.  DVT prophylaxis: xarelto  Code Status: DNR Family Communication: son at bedside Disposition:   Status is: Inpatient  Remains inpatient appropriate because:Inpatient level of care appropriate due to severity of illness   Dispo: The patient is from: Home              Anticipated d/c is to: Home              Patient currently is not medically stable to d/c.   Difficult to place patient No  Consultants:   cardiology  Procedures:  Echo IMPRESSIONS    1. Limited study Apical images incomplete (apical 2 and 3 chamber not  done). Overall, based on previous images LVEF is severely depressed at  approximately 20 to 25%. Left ventricular diastolic parameters are  indeterminate.  2. Right ventricular systolic function is moderately reduced.  The right  ventricular size is normal.  3. Left atrial size was mildly dilated.  4. Right atrial size was mildly dilated.  5. The mitral valve is normal in structure. Mild mitral valve  regurgitation.  6. The aortic valve is normal in structure. Aortic valve regurgitation is  not visualized.   Antimicrobials:  Anti-infectives (From admission, onward)   None         Subjective: No new complaints SOB is improved  Objective: Vitals:   03/13/21 2009 03/14/21 0500 03/14/21 0504 03/14/21 1334  BP: 138/83  137/71 110/61  Pulse: (!) 109  93 (!) 109  Resp: 18  16 17   Temp: 97.7 F (36.5 C)  97.6 F (36.4 C) 97.8 F (36.6 C)  TempSrc: Oral  Oral Oral  SpO2: 97%  90% 92%  Weight:  57.7 kg    Height:        Intake/Output Summary (Last 24 hours) at 03/14/2021 1458 Last data filed at 03/14/2021 1418 Gross per 24 hour  Intake 240 ml  Output 1550 ml  Net -1310 ml   Filed Weights   03/12/21 1710 03/14/21 0500  Weight: 58.5 kg 57.7 kg    Examination:  General exam: Appears calm and comfortable  Respiratory system: Clear to auscultation. Respiratory effort normal. Cardiovascular system: RRR Gastrointestinal system: Abdomen is nondistended, soft and nontender. Central nervous system: Alert and oriented. Very hard of hearing.  No focal neurological deficits. Extremities: no LEE Skin: No rashes, lesions or ulcers Psychiatry: Judgement and insight appear normal. Mood & affect appropriate.     Data Reviewed: I have personally reviewed following labs and imaging studies  CBC: Recent Labs  Lab 03/12/21 1812 03/13/21 0351 03/14/21 0333  WBC 8.2 10.1 9.0  NEUTROABS 6.0  --  6.2  HGB 14.0 13.3 13.5  HCT 43.1 41.1 41.0  MCV 93.3 93.0 91.1  PLT 291 244 326    Basic Metabolic Panel: Recent Labs  Lab 03/12/21 1812 03/13/21 0351 03/14/21 0333  NA 134* 134* 134*  K 3.3* 2.9* 3.4*  CL 98 99 99  CO2 26 23 27   GLUCOSE 103* 90 99  BUN 32* 26* 23  CREATININE 0.81  0.65 0.89  CALCIUM 8.7* 8.7* 8.4*  MG  --  2.0 1.8    GFR: Estimated Creatinine Clearance: 37.1 mL/min (by C-G formula based on SCr of 0.89 mg/dL).  Liver Function Tests: No results for input(s): AST, ALT, ALKPHOS, BILITOT, PROT, ALBUMIN in the last 168 hours.  CBG: No results for input(s): GLUCAP in the last 168 hours.   Recent Results (from the past 240 hour(s))  Resp Panel by RT-PCR (Flu Eufelia Veno&B, Covid) Nasopharyngeal Swab     Status: None   Collection Time: 03/12/21  6:15 PM   Specimen: Nasopharyngeal Swab; Nasopharyngeal(NP) swabs in vial transport medium  Result Value Ref Range Status   SARS Coronavirus 2 by RT PCR NEGATIVE NEGATIVE Final    Comment: (NOTE) SARS-CoV-2 target nucleic acids are NOT DETECTED.  The SARS-CoV-2 RNA is generally detectable in upper respiratory specimens during the acute phase of infection. The lowest concentration of SARS-CoV-2 viral copies this assay can detect is 138 copies/mL.  Alphonso Gregson negative result does not preclude SARS-Cov-2 infection and should not be used as the sole basis for treatment or other patient management decisions. Nekeisha Aure negative result may occur with  improper specimen collection/handling, submission of specimen other than nasopharyngeal swab, presence of viral mutation(s) within the areas targeted by this assay, and inadequate number of viral copies(<138 copies/mL). Tacha Manni negative result must be combined with clinical observations, patient history, and epidemiological information. The expected result is Negative.  Fact Sheet for Patients:  EntrepreneurPulse.com.au  Fact Sheet for Healthcare Providers:  IncredibleEmployment.be  This test is no t yet approved or cleared by the [place] FDA and  has been authorized for detection and/or diagnosis of SARS-CoV-2 by FDA under an Emergency Use Authorization (EUA). This EUA will remain  in effect (meaning this test can be used) for the duration of  the COVID-19 declaration under Section 564(b)(1) of the Act, 21 U.S.C.section 360bbb-3(b)(1), unless the authorization is terminated  or revoked sooner.       Influenza Nazariah Cadet by PCR NEGATIVE NEGATIVE Final   Influenza B by PCR NEGATIVE NEGATIVE Final    Comment: (NOTE) The Xpert Xpress SARS-CoV-2/FLU/RSV plus assay is intended as an aid in the diagnosis of influenza from Nasopharyngeal swab specimens and should not be used as Corey Laski sole basis for treatment. Nasal washings and aspirates are unacceptable for Xpert Xpress SARS-CoV-2/FLU/RSV testing.  Fact Sheet for Patients: EntrepreneurPulse.com.au  Fact Sheet for Healthcare Providers: IncredibleEmployment.be  This test is not yet approved or cleared by the [place] FDA and has been authorized for detection and/or diagnosis of SARS-CoV-2 by FDA under an Emergency Use Authorization (EUA). This EUA will remain in effect (meaning this test can be used) for the duration of the COVID-19 declaration under Section 564(b)(1) of the Act, 21 U.S.C. section 360bbb-3(b)(1), unless the authorization is terminated or revoked.  Performed at [facility], [place] [address] [place], [place] [address]          Radiology Studies: DG Chest 2 View  Result Date: 03/12/2021 CLINICAL DATA:  Shortness of breath EXAM: CHEST - 2 VIEW COMPARISON:  03/13/2015 FINDINGS: Small bilateral pleural effusions. Basilar consolidations. Obscured cardiomediastinal silhouette. Vascular congestion. No pneumothorax IMPRESSION: Vascular congestion with small bilateral effusions. Bibasilar airspace disease, probably atelectasis Electronically Signed   By: Donavan Foil M.D.   On: 03/12/2021 18:22   ECHOCARDIOGRAM COMPLETE  Result Date: 03/13/2021    ECHOCARDIOGRAM REPORT   Patient Name:   CASHAY BINGMAN Date of Exam: 03/13/2021 Medical Rec #:  [id number]   Height:       61.0 in Accession #:    [id number]  Weight:        129.0 lb Date of Birth:  17-Nov-1932   BSA:          1.568 m Patient Age:    27 years    BP:           123/48 mmHg Patient Gender: F           HR:           97 bpm. Exam Location:  Inpatient Procedure: 2D Echo, Cardiac Doppler and Color Doppler Indications:   Congestive Heart Failure I50.9  History:       Patient has no prior history of Echocardiogram examinations.                Nausea.  Sonographer:   Merrie Roof Referring      [phone] [place] Phys: IMPRESSIONS  1.  Limited study Apical images incomplete (apical 2 and 3 chamber not done). Overall, based on previous images LVEF is severely depressed at approximately 20 to 25%. Left ventricular diastolic parameters are indeterminate.  2. Right ventricular systolic function is moderately reduced. The right ventricular size is normal.  3. Left atrial size was mildly dilated.  4. Right atrial size was mildly dilated.  5. The mitral valve is normal in structure. Mild mitral valve regurgitation.  6. The aortic valve is normal in structure. Aortic valve regurgitation is not visualized. FINDINGS  Left Ventricle: The left ventricular internal cavity size was normal in size. Limite studya. Apical images incomplete (apical 2 and 3 chamber views not done. Overall, based on ther images LVEF is severely depressed at appoximately 20 to 25%. There is no  left ventricular hypertrophy. Left ventricular diastolic parameters are indeterminate. Right Ventricle: The right ventricular size is normal. Right vetricular wall thickness was not assessed. Right ventricular systolic function is moderately reduced. Left Atrium: Left atrial size was mildly dilated. Right Atrium: Right atrial size was mildly dilated. Pericardium: Trivial pericardial effusion is present. Mitral Valve: The mitral valve is normal in structure. Mild mitral valve regurgitation. Tricuspid Valve: The tricuspid valve is normal in structure. Tricuspid valve regurgitation is mild. Aortic Valve: The aortic valve is  normal in structure. Aortic valve regurgitation is not visualized. Aortic valve mean gradient measures 4.0 mmHg. Aortic valve peak gradient measures 8.1 mmHg. Aortic valve area, by VTI measures 0.99 cm. Pulmonic Valve: The pulmonic valve was not well visualized. Pulmonic valve regurgitation is not visualized. Aorta: The aortic root is normal in size and structure. Venous: The inferior vena cava was not well visualized. IAS/Shunts: The interatrial septum was not assessed.  LEFT VENTRICLE PLAX 2D LVIDd:         4.61 cm LVIDs:         3.92 cm LV PW:         0.90 cm LV IVS:        0.90 cm LVOT diam:     1.80 cm LV SV:         26 LV SV Index:   17 LVOT Area:     2.54 cm  LEFT ATRIUM           Index       RIGHT ATRIUM           Index LA diam:      3.40 cm 2.17 cm/m  RA Area:     16.50 cm LA Vol (A4C): 60.7 ml 38.72 ml/m RA Volume:   45.60 ml  29.09 ml/m  AORTIC VALVE AV Area (Vmax):    1.16 cm AV Area (Vmean):   1.10 cm AV Area (VTI):     0.99 cm AV Vmax:           142.00 cm/s AV Vmean:          91.400 cm/s AV VTI:            0.263 m AV Peak Grad:      8.1 mmHg AV Mean Grad:      4.0 mmHg LVOT Vmax:         65.00 cm/s LVOT Vmean:        39.500 cm/s LVOT VTI:          0.102 m LVOT/AV VTI ratio: 0.39  AORTA Ao Root diam: 2.80 cm  SHUNTS Systemic VTI:  0.10 m Systemic Diam: 1.80 cm Dorris Carnes MD Electronically signed by  Dorris Carnes MD Signature Date/Time: 03/13/2021/5:26:02 PM    Final         Scheduled Meds: . amiodarone  200 mg Oral BID   Followed by  . [START ON 03/18/2021] amiodarone  200 mg Oral Daily  . buPROPion  150 mg Oral Daily  . carvedilol  6.25 mg Oral BID WC  . furosemide  40 mg Intravenous Q12H  . Rivaroxaban  15 mg Oral Q supper  . rosuvastatin  20 mg Oral q morning  . [START ON 03/15/2021] sacubitril-valsartan  1 tablet Oral BID  . sodium chloride flush  3 mL Intravenous Q12H  . spironolactone  12.5 mg Oral Daily   Continuous Infusions:   LOS: 1 day    Time spent: over 30  min    Fayrene Helper, MD [facility]   To contact the attending provider between 7A-7P or the covering provider during after hours 7P-7A, please log into the web site [email or web address] and access using universal [place] password for that web site. If you do not have the password, please call the hospital operator.  03/14/2021, 2:58 PM

## 2021-03-15 LAB — CBC WITH DIFFERENTIAL/PLATELET
Abs Immature Granulocytes: 0.02 10*3/uL (ref 0.00–0.07)
Basophils Absolute: 0.1 10*3/uL (ref 0.0–0.1)
Basophils Relative: 1 %
Eosinophils Absolute: 0.2 10*3/uL (ref 0.0–0.5)
Eosinophils Relative: 2 %
HCT: 42.6 % (ref 36.0–46.0)
Hemoglobin: 14 g/dL (ref 12.0–15.0)
Immature Granulocytes: 0 %
Lymphocytes Relative: 17 %
Lymphs Abs: 1.5 10*3/uL (ref 0.7–4.0)
MCH: 29.8 pg (ref 26.0–34.0)
MCHC: 32.9 g/dL (ref 30.0–36.0)
MCV: 90.6 fL (ref 80.0–100.0)
Monocytes Absolute: 1.3 10*3/uL — ABNORMAL HIGH (ref 0.1–1.0)
Monocytes Relative: 15 %
Neutro Abs: 5.8 10*3/uL (ref 1.7–7.7)
Neutrophils Relative %: 65 %
Platelets: 274 10*3/uL (ref 150–400)
RBC: 4.7 MIL/uL (ref 3.87–5.11)
RDW: 13.9 % (ref 11.5–15.5)
WBC: 8.8 10*3/uL (ref 4.0–10.5)
nRBC: 0 % (ref 0.0–0.2)

## 2021-03-15 LAB — BASIC METABOLIC PANEL
Anion gap: 8 (ref 5–15)
BUN: 23 mg/dL (ref 8–23)
CO2: 28 mmol/L (ref 22–32)
Calcium: 8.7 mg/dL — ABNORMAL LOW (ref 8.9–10.3)
Chloride: 100 mmol/L (ref 98–111)
Creatinine, Ser: 0.75 mg/dL (ref 0.44–1.00)
GFR, Estimated: >60 mL/min (ref >60)
Glucose, Bld: 85 mg/dL (ref 70–99)
Potassium: 2.9 mmol/L — ABNORMAL LOW (ref 3.5–5.1)
Sodium: 136 mmol/L (ref 135–145)

## 2021-03-15 LAB — MAGNESIUM: Magnesium: 1.9 mg/dL (ref 1.7–2.4)

## 2021-03-15 MED ORDER — AMIODARONE HCL 200 MG PO TABS: 200.0000 mg | ORAL_TABLET | Freq: Every day | ORAL | 0 refills | Status: DC

## 2021-03-15 MED ORDER — POTASSIUM CHLORIDE CRYS ER 20 MEQ PO TBCR
40.0000 meq | EXTENDED_RELEASE_TABLET | Freq: Once | ORAL | Status: AC
  Administered 2021-03-15: 40 meq via ORAL
  Filled 2021-03-15: qty 2

## 2021-03-15 MED ORDER — SACUBITRIL-VALSARTAN 24-26 MG PO TABS: 1.0000 | ORAL_TABLET | Freq: Two times a day (BID) | ORAL | 0 refills | Status: DC

## 2021-03-15 MED ORDER — FUROSEMIDE 40 MG PO TABS: 40.0000 mg | ORAL_TABLET | Freq: Every day | ORAL | 0 refills | Status: DC

## 2021-03-15 MED ORDER — RIVAROXABAN 15 MG PO TABS
15.0000 mg | ORAL_TABLET | Freq: Every day | ORAL | 0 refills | Status: DC
Start: 2021-03-15 — End: 2021-03-21

## 2021-03-15 MED ORDER — SPIRONOLACTONE 25 MG PO TABS
12.5000 mg | ORAL_TABLET | Freq: Every day | ORAL | 0 refills | Status: DC
Start: 2021-03-16 — End: 2021-03-21

## 2021-03-15 MED ORDER — POTASSIUM CHLORIDE 10 MEQ/100ML IV SOLN
10.0000 meq | INTRAVENOUS | Status: AC
  Filled 2021-03-15: qty 100

## 2021-03-15 MED ORDER — AMIODARONE HCL 200 MG PO TABS: 200.0000 mg | ORAL_TABLET | Freq: Two times a day (BID) | ORAL | 0 refills | Status: DC

## 2021-03-15 MED ORDER — POTASSIUM CHLORIDE ER 10 MEQ PO TBCR: 20.0000 meq | EXTENDED_RELEASE_TABLET | Freq: Every day | ORAL | 0 refills | Status: DC

## 2021-03-15 MED ORDER — SODIUM CHLORIDE 0.9 % IV SOLN: INTRAVENOUS | Status: DC | PRN

## 2021-03-15 MED ORDER — CARVEDILOL 6.25 MG PO TABS: 6.2500 mg | ORAL_TABLET | Freq: Two times a day (BID) | ORAL | 0 refills | Status: DC

## 2021-03-15 MED ORDER — FUROSEMIDE 40 MG PO TABS
40.0000 mg | ORAL_TABLET | Freq: Every day | ORAL | Status: DC
  Administered 2021-03-15: 40 mg via ORAL
  Filled 2021-03-15: qty 1

## 2021-03-15 NOTE — Progress Notes (Signed)
Informed Patient of her potassium level and order for potassium tablets and IV.  Accepted PO K-Dur.  Refused IV Potassium at this time, because it will burn at IV site.  Encouraged Patient to talk with the Doctor.

## 2021-03-15 NOTE — Progress Notes (Signed)
Cardiology Progress Note  Patient ID: Amy [name] MRN: [id number] DOB: 03-16-1933 Date of Encounter: 03/15/2021  Primary Cardiologist: None  Subjective   Chief Complaint: None.  HPI: Appears euvolemic.  No shortness of breath.  Wants to go home.  ROS:  All other ROS reviewed and negative. Pertinent positives noted in the HPI.     Inpatient Medications  Scheduled Meds: . amiodarone  200 mg Oral BID   Followed by  . [START ON 03/18/2021] amiodarone  200 mg Oral Daily  . buPROPion  150 mg Oral Daily  . carvedilol  6.25 mg Oral BID WC  . furosemide  40 mg Oral Daily  . Rivaroxaban  15 mg Oral Q supper  . rosuvastatin  20 mg Oral q morning  . sacubitril-valsartan  1 tablet Oral BID  . sodium chloride flush  3 mL Intravenous Q12H  . spironolactone  12.5 mg Oral Daily   Continuous Infusions: . sodium chloride     PRN Meds: sodium chloride, acetaminophen **OR** acetaminophen, hydrOXYzine, ondansetron **OR** ondansetron (ZOFRAN) IV, senna-docusate   Vital Signs   Vitals:   03/14/21 1334 03/14/21 2125 03/15/21 0452 03/15/21 0500  BP: 110/61 115/66 132/69   Pulse: (!) 109 91 90   Resp: 17  20   Temp: 97.8 F (36.6 C) 97.6 F (36.4 C) 98.1 F (36.7 C)   TempSrc: Oral Oral Oral   SpO2: 92% 92% 94%   Weight:    55.5 kg  Height:        Intake/Output Summary (Last 24 hours) at 03/15/2021 1320 Last data filed at 03/15/2021 0911 Gross per 24 hour  Intake 840 ml  Output 750 ml  Net 90 ml   Last 3 Weights 03/15/2021 03/14/2021 03/12/2021  Weight (lbs) 122 lb 5.7 oz 127 lb 3.3 oz 129 lb  Weight (kg) 55.5 kg 57.7 kg 58.514 kg      Telemetry  Overnight telemetry shows sinus rhythm predominantly, brief A. fib, which I personally reviewed.    Physical Exam   Vitals:   03/14/21 1334 03/14/21 2125 03/15/21 0452 03/15/21 0500  BP: 110/61 115/66 132/69   Pulse: (!) 109 91 90   Resp: 17  20   Temp: 97.8 F (36.6 C) 97.6 F (36.4 C) 98.1 F (36.7 C)   TempSrc: Oral Oral Oral    SpO2: 92% 92% 94%   Weight:    55.5 kg  Height:         Intake/Output Summary (Last 24 hours) at 03/15/2021 1320 Last data filed at 03/15/2021 0911 Gross per 24 hour  Intake 840 ml  Output 750 ml  Net 90 ml    Last 3 Weights 03/15/2021 03/14/2021 03/12/2021  Weight (lbs) 122 lb 5.7 oz 127 lb 3.3 oz 129 lb  Weight (kg) 55.5 kg 57.7 kg 58.514 kg    Body mass index is 23.12 kg/m.  General: Well nourished, well developed, in no acute distress Head: Atraumatic, normal size  Eyes: PEERLA, EOMI  Neck: Supple, no JVD Endocrine: No thryomegaly Cardiac: Normal S1, S2; RRR; no murmurs, rubs, or gallops Lungs: Diminished breath sounds at the lung bases Abd: Soft, nontender, no hepatomegaly  Ext: No edema, pulses 2+ Musculoskeletal: No deformities, BUE and BLE strength normal and equal Skin: Warm and dry, no rashes   Neuro: Alert and oriented to person, place, time, and situation, CNII-XII grossly intact, no focal deficits  Psych: Normal mood and affect   Labs  High Sensitivity Troponin:   Recent Labs  Lab  03/12/21 1812 03/12/21 2257  TROPONINIHS 31* 33*     Cardiac EnzymesNo results for input(s): TROPONINI in the last 168 hours. No results for input(s): TROPIPOC in the last 168 hours.  Chemistry Recent Labs  Lab 03/13/21 0351 03/14/21 0333 03/15/21 0329  NA 134* 134* 136  K 2.9* 3.4* 2.9*  CL 99 99 100  CO2 23 27 28   GLUCOSE 90 99 85  BUN 26* 23 23  CREATININE 0.65 0.89 0.75  CALCIUM 8.7* 8.4* 8.7*  GFRNONAA >60 >60 >60  ANIONGAP 12 8 8     Hematology Recent Labs  Lab 03/13/21 0351 03/14/21 0333 03/15/21 0329  WBC 10.1 9.0 8.8  RBC 4.42 4.50 4.70  HGB 13.3 13.5 14.0  HCT 41.1 41.0 42.6  MCV 93.0 91.1 90.6  MCH 30.1 30.0 29.8  MCHC 32.4 32.9 32.9  RDW 14.1 14.0 13.9  PLT 244 251 274   BNP Recent Labs  Lab 03/12/21 1812  BNP 1,128.3*    DDimer No results for input(s): DDIMER in the last 168 hours.   Radiology  ECHOCARDIOGRAM COMPLETE  Result Date:  03/13/2021    ECHOCARDIOGRAM REPORT   Patient Name:   Amy [name] Date of Exam: 03/13/2021 Medical Rec #:  [id number]   Height:       61.0 in Accession #:    [id number]  Weight:       129.0 lb Date of Birth:  02/25/1933   BSA:          1.568 m Patient Age:    87 years    BP:           123/48 mmHg Patient Gender: F           HR:           97 bpm. Exam Location:  Inpatient Procedure: 2D Echo, Cardiac Doppler and Color Doppler Indications:   Congestive Heart Failure I50.9  History:       Patient has no prior history of Echocardiogram examinations.                Nausea.  Sonographer:   Merrie Roof Referring      [phone] [place] [facility]: IMPRESSIONS  1. Limited study Apical images incomplete (apical 2 and 3 chamber not done). Overall, based on previous images LVEF is severely depressed at approximately 20 to 25%. Left ventricular diastolic parameters are indeterminate.  2. Right ventricular systolic function is moderately reduced. The right ventricular size is normal.  3. Left atrial size was mildly dilated.  4. Right atrial size was mildly dilated.  5. The mitral valve is normal in structure. Mild mitral valve regurgitation.  6. The aortic valve is normal in structure. Aortic valve regurgitation is not visualized. FINDINGS  Left Ventricle: The left ventricular internal cavity size was normal in size. Limite studya. Apical images incomplete (apical 2 and 3 chamber views not done. Overall, based on ther images LVEF is severely depressed at appoximately 20 to 25%. There is no  left ventricular hypertrophy. Left ventricular diastolic parameters are indeterminate. Right Ventricle: The right ventricular size is normal. Right vetricular wall thickness was not assessed. Right ventricular systolic function is moderately reduced. Left Atrium: Left atrial size was mildly dilated. Right Atrium: Right atrial size was mildly dilated. Pericardium: Trivial pericardial effusion is present. Mitral Valve: The mitral valve is normal in  structure. Mild mitral valve regurgitation. Tricuspid Valve: The tricuspid valve is normal in structure. Tricuspid valve regurgitation is mild. Aortic Valve: The aortic  valve is normal in structure. Aortic valve regurgitation is not visualized. Aortic valve mean gradient measures 4.0 mmHg. Aortic valve peak gradient measures 8.1 mmHg. Aortic valve area, by VTI measures 0.99 cm. Pulmonic Valve: The pulmonic valve was not well visualized. Pulmonic valve regurgitation is not visualized. Aorta: The aortic root is normal in size and structure. Venous: The inferior vena cava was not well visualized. IAS/Shunts: The interatrial septum was not assessed.  LEFT VENTRICLE PLAX 2D LVIDd:         4.61 cm LVIDs:         3.92 cm LV PW:         0.90 cm LV IVS:        0.90 cm LVOT diam:     1.80 cm LV SV:         26 LV SV Index:   17 LVOT Area:     2.54 cm  LEFT ATRIUM           Index       RIGHT ATRIUM           Index LA diam:      3.40 cm 2.17 cm/m  RA Area:     16.50 cm LA Vol (A4C): 60.7 ml 38.72 ml/m RA Volume:   45.60 ml  29.09 ml/m  AORTIC VALVE AV Area (Vmax):    1.16 cm AV Area (Vmean):   1.10 cm AV Area (VTI):     0.99 cm AV Vmax:           142.00 cm/s AV Vmean:          91.400 cm/s AV VTI:            0.263 m AV Peak Grad:      8.1 mmHg AV Mean Grad:      4.0 mmHg LVOT Vmax:         65.00 cm/s LVOT Vmean:        39.500 cm/s LVOT VTI:          0.102 m LVOT/AV VTI ratio: 0.39  AORTA Ao Root diam: 2.80 cm  SHUNTS Systemic VTI:  0.10 m Systemic Diam: 1.80 cm Dorris Carnes MD Electronically signed by Dorris Carnes MD Signature Date/Time: 03/13/2021/5:26:02 PM    Final     Cardiac Studies  TTE 03/13/2021 1. Limited study Apical images incomplete (apical 2 and 3 chamber not  done). Overall, based on previous images LVEF is severely depressed at  approximately 20 to 25%. Left ventricular diastolic parameters are  indeterminate.  2. Right ventricular systolic function is moderately reduced. The right  ventricular size  is normal.  3. Left atrial size was mildly dilated.  4. Right atrial size was mildly dilated.  5. The mitral valve is normal in structure. Mild mitral valve  regurgitation.  6. The aortic valve is normal in structure. Aortic valve regurgitation is  not visualized.   Patient Profile  Amy [name] is a 87 y.o. female with history of hypertension hyperlipidemia who was admitted on 03/12/2021 for new onset systolic heart failure.  Assessment & Plan   1.  New onset systolic heart failure, EF 20-25% -Unclear etiology. -Troponins negative.  EKG nonischemic.  No symptoms of chest pain. -TSH normal. -Was found to have episodes of rapid A. fib.  She has been unaware of this.  Possibly this is an arrhythmia induced cardiomyopathy. -We have started her on guideline directed medical therapy.  She is euvolemic. -Would discharge home on 40 mg of  p.o. Lasix.  We will give her 20 mEq of potassium with this.  Her potassium was low today.  This was related to IV diuresis. -Continue Coreg 6.25 mg twice daily.  Continue Entresto 24-26 mg twice daily.  Continue Aldactone 12.5 mg daily.  She is reluctant to take many medications.  We likely will add an SGLT2 inhibitor as an outpatient. -We are pursuing a rhythm control strategy.  She will be loaded with amiodarone.  She will then continue 200 mg daily. -She has expressed a desire for noninvasive treatments.  We have decided on medications for her heart failure.  2.  Paroxysmal atrial fibrillation -Rhythm control with amiodarone.  Continue 200 mg twice daily for 7 days and then 200 mg daily.  I will follow her in the outpatient setting. -She had troubles with Eliquis.  Transition to Xarelto 15 mg daily.  No issues.   [facility] will sign off.   Medication Recommendations: Cardiac medications as above. Other recommendations (labs, testing, etc): None. Follow up as an outpatient: We will arrange a 1 week hospital follow-up appointment.  She will then see  me in 6 to 8 weeks.  For questions or updates, please contact [place] Please consult [email or web address] for contact info under   Time Spent with Patient: I have spent a total of 25 minutes with patient reviewing hospital notes, telemetry, EKGs, labs and examining the patient as well as establishing an assessment and plan that was discussed with the patient.  > 50% of time was spent in direct patient care.    Signed, Addison Naegeli. Audie Box, MD, [place]  03/15/2021 1:20 PM

## 2021-03-15 NOTE — TOC Transition Note (Signed)
Transition of Care [facility]) - CM/SW Discharge Note   Patient Details  Name: Amy [name] MRN: [id number] Date of Birth: [birth year]  [facility] CM/SW Contact:  Ross Ludwig, LCSW Phone Number: 03/15/2021, 1:54 PM   Clinical Narrative:     Patient will be going home with home health PT, OT, RN, and aide, through [facility].  CSW signing off please reconsult with any other social work needs, home health agency has been notified of planned discharge.  Rollator has been ordered from [employer], and will be delivered to the room before discharge.   Final next level of care: [place] Barriers to Discharge: Barriers Resolved   Patient Goals and CMS Choice Patient states their goals for this hospitalization and ongoing recovery are:: To return back home with home health. CMS Medicare.gov Compare Post Acute Care list provided to:: Patient Choice offered to / list presented to : Patient  Discharge Placement                       Discharge Plan and Services                  DME Agency: Other - Comment [facility]) Date DME Agency Contacted: 03/15/21 Time DME Agency Contacted: 25 Representative spoke with at DME Agency: [facility]on [facility]ills HH Arranged: PT,OT,Nurse's [place] Agency: [facility] Date [place]: 03/15/21 Time HH Agency Contacted: 1100 Representative spoke with at [place]: [place] ([place]) Interventions     Readmission Risk Interventions No flowsheet data found.

## 2021-03-15 NOTE — Discharge Summary (Signed)
Physician Discharge Summary  Amy [name] [id number] DOB: 12-17-1932 DOA: 03/12/2021  PCP: Amy Contras, MD  Admit date: 03/12/2021 Discharge date: 03/15/2021  Time spent: 40 minutes  Recommendations for Outpatient Follow-up:  1. Follow outpatient CBC/CMP  2. Follow cardiology outpatient 3. Follow volume status/creatinine/lytes 4. Follow HR/rhythm on amiodarone (discharged withamio load) 5. Follow tolerance of GDMT for systolic heart failure - consider SGLT2 inhibitor outpatient  6. Follow costs of meds 7. Consider stress test outpatient per cardiology 8. Follow outpatient CXR  Discharge Diagnoses:  Principal Problem:   Acute CHF (congestive heart failure) (HCC) Active Problems:   Hypertension   Hyperlipidemia   Paroxysmal atrial fibrillation (HCC)   Acute systolic CHF (congestive heart failure) ([place])   Discharge Condition: stable  Diet recommendation: heart healthy  Filed Weights   03/12/21 1710 03/14/21 0500 03/15/21 0500  Weight: 58.5 kg 57.7 kg 55.5 kg    History of present illness:  Amy [name] is an 87 year old female with PMH hypertension, hyperlipidemia, anxiety who presented to the ER with shortness of breath. Symptoms had been ongoing for approximately 1 week prior to admission and progressively worsening. She also endorsed new swelling in her legs which is not typically present. Her appetite has been poor lately and she has not been eating as much and thinks she has been losing weight from this despite the increased swelling in her legs. She endorsed an occasional cough on admission however no fever or chills. She described orthopnea and PND. She has not had similar symptoms or episodes of this in the past.  On admission she underwent CXR which showed vascular congestion and small bilateral pleural effusions. BNP was also elevated 1128. Of note, she was diagnosed with atrial fibrillation in the past(Aug 2021),but stopped Eliquis after 1 dose due to GI  upset. Anticoagulation was again discussed on admission and patient was amenable for trial of Xarelto which was initiated.  She was admitted for IV diuresis with Lasix and echo for further cardiac work-up.  Echo was notable for systolic heart failure.  Cardiology was consulted.  She's improved with diuresis.  Started on GDMT and amiodarone and xarelto for atrial fibrillation.  Discharged with plans for outpatient cardiology follow up.  Hospital Course:  New onset acute CHF Presenting with DOE, orthopnea, PND, elevated BNP, CXR with evidence of vascular congestion and bilateral pleural effusions consistent with likely new onset acute CHF. She has no known prior history of CHF.  - Echo 5/17 with EF 20-25%, moderately reduced RVSF (see report) - Cardiology c/s, appreciate recs -> unclear etiology, ? Atrial fibrillation with LBBB - recommending guideline directed medical therapy with rhythm control strategy for atrial fibrillation with amiodarone.  Consider stress test outpatient.  - coreg 6.25 mg BID, entresto, aldactone 12.5 mg daily.  Consider SGLT 2 inhibitor outpatient.  Amiodarone load.   - consider SGLT2 inhibitor outpatient - 3-6 months guideline directed medical therapy followed by repeat US  Paroxysmal atrial fibrillation: Per prior documentation was started on Eliquis 2.5 mg twice daily in August 2021 for new onset Amy [name]. fib seen at PCP visit at that time. Patient states she stopped Eliquis after 1 dose as it upset her stomach. Her CHA2DS2-VASc score is at least 5 which is an indication for anticoagulation to reduce the risk of stroke.  She was agreeable to trial of anticoagulation after discussion with my partner. - Xarelto 15 mg daily -amiodarone per cardiology -Monitor on telemetry  Hypokalemia: Replace and follow  Hypertension: Transition to entresto from ARB, follow  Aldactone, coreg Follow BP  Hyperlipidemia: Continue rosuvastatin.  Anxiety: Continue bupropion and  Ataraxprn.  Procedures: Echo IMPRESSIONS    1. Limited study Apical images incomplete (apical 2 and 3 chamber not  done). Overall, based on previous images LVEF is severely depressed at  approximately 20 to 25%. Left ventricular diastolic parameters are  indeterminate.  2. Right ventricular systolic function is moderately reduced. The right  ventricular size is normal.  3. Left atrial size was mildly dilated.  4. Right atrial size was mildly dilated.  5. The mitral valve is normal in structure. Mild mitral valve  regurgitation.  6. The aortic valve is normal in structure. Aortic valve regurgitation is  not visualized.   Consultations:  cardiology  Discharge Exam: Vitals:   03/14/21 2125 03/15/21 0452  BP: 115/66 132/69  Pulse: 91 90  Resp:  20  Temp: 97.6 F (36.4 C) 98.1 F (36.7 C)  SpO2: 92% 94%   Feels better today Son at bedside Discussed discharge plan  General: No acute distress. Cardiovascular: Heart sounds show [name] regular rate, and rhythm.  Lungs: Clear to auscultation bilaterally Abdomen: Soft, nontender, nondistended Neurological: Alert and oriented 3. Moves all extremities 4. Cranial nerves II through XII grossly intact. Skin: Warm and dry. No rashes or lesions. Extremities: No clubbing or cyanosis. No edema.   Discharge Instructions   Discharge Instructions    (HEART FAILURE PATIENTS) Call MD:  Anytime you have any of the following symptoms: 1) 3 pound weight gain in 24 hours or 5 pounds in 1 week 2) shortness of breath, with or without [name] dry hacking cough 3) swelling in the hands, feet or stomach 4) if you have to sleep on extra pillows at night in order to breathe.   Complete by: As directed    (HEART FAILURE PATIENTS) Call MD:  Anytime you have any of the following symptoms: 1) 3 pound weight gain in 24 hours or 5 pounds in 1 week 2) shortness of breath, with or without [name] dry hacking cough 3) swelling in the hands, feet or stomach 4) if you  have to sleep on extra pillows at night in order to breathe.   Complete by: As directed    Avoid straining   Complete by: As directed    Call MD for:  difficulty breathing, headache or visual disturbances   Complete by: As directed    Call MD for:  difficulty breathing, headache or visual disturbances   Complete by: As directed    Call MD for:  extreme fatigue   Complete by: As directed    Call MD for:  extreme fatigue   Complete by: As directed    Call MD for:  hives   Complete by: As directed    Call MD for:  hives   Complete by: As directed    Call MD for:  persistant dizziness or light-headedness   Complete by: As directed    Call MD for:  persistant dizziness or light-headedness   Complete by: As directed    Call MD for:  persistant nausea and vomiting   Complete by: As directed    Call MD for:  persistant nausea and vomiting   Complete by: As directed    Call MD for:  redness, tenderness, or signs of infection (pain, swelling, redness, odor or green/yellow discharge around incision site)   Complete by: As directed    Call MD for:  redness, tenderness, or signs of infection (pain, swelling, redness, odor or  green/yellow discharge around incision site)   Complete by: As directed    Call MD for:  severe uncontrolled pain   Complete by: As directed    Call MD for:  severe uncontrolled pain   Complete by: As directed    Call MD for:  temperature >100.4   Complete by: As directed    Call MD for:  temperature >100.4   Complete by: As directed    Diet - low sodium heart healthy   Complete by: As directed    Diet - low sodium heart healthy   Complete by: As directed    Discharge instructions   Complete by: As directed    You were seen for Dung Salinger heart failure exacerbation.  You've improved with lasix.  Cardiology was consulted and started you on several new medicines for your heart failure.  STOP losartan.  Start entresto, coreg, spironolactone, lasix, and potassium.    For your  atrial fibrillation, you've been started on amiodarone and xarelto.  The amiodarone will be 200 mg twice Myda Detwiler day for the next 6 days followed by 200 mg daily.   Please follow up with your PCP as an outpatient.  Follow up with cardiology as an outpatient.  Check your weights daily.  If your weight increases by 2-3 lbs in Kathaleya Mcduffee day or 5 lbs in Alexandar Weisenberger week, call your doctor for instructions on what you should do.  Return for new, recurrent, or worsening symptoms.  Please ask your PCP to request records from this hospitalization so they know what was done and what the next steps will be.   Heart Failure patients record your daily weight using the same scale at the same time of day   Complete by: As directed    Increase activity slowly   Complete by: As directed    Increase activity slowly   Complete by: As directed      Allergies as of 03/15/2021      Reactions   Lisinopril    Other reaction(s): cough   Oxycodone Nausea And Vomiting, Other (See Comments)   Intolerance--"its like I was floating up on the ceiling"       Medication List    STOP taking these medications   losartan 100 MG tablet Commonly known as: COZAAR     TAKE these medications   amiodarone 200 MG tablet Commonly known as: PACERONE Take 1 tablet (200 mg total) by mouth 2 (two) times daily for 11 doses.   amiodarone 200 MG tablet Commonly known as: PACERONE Take 1 tablet (200 mg total) by mouth daily. (start on 5/25 after you complete the twice daily dosing) Start taking on: Mar 21, 2021   buPROPion 150 MG 24 hr tablet Commonly known as: WELLBUTRIN XL Take 150 mg by mouth daily.   carvedilol 6.25 MG tablet Commonly known as: COREG Take 1 tablet (6.25 mg total) by mouth 2 (two) times daily with Bohemia Shellhammer meal.   furosemide 40 MG tablet Commonly known as: LASIX Take 1 tablet (40 mg total) by mouth daily. Start taking on: Mar 16, 2021   hydrOXYzine 25 MG tablet Commonly known as: ATARAX/VISTARIL Take 12.5-25 mg by mouth  every 8 (eight) hours as needed for anxiety.   IRON PO Take 1 tablet by mouth daily.   potassium chloride 10 MEQ tablet Commonly known as: Klor-Con 10 Take 2 tablets (20 mEq total) by mouth daily.   Rivaroxaban 15 MG Tabs tablet Commonly known as: XARELTO Take 1 tablet (15 mg total)  by mouth daily with supper.   rosuvastatin 20 MG tablet Commonly known as: CRESTOR Take 20 mg by mouth every morning.   sacubitril-valsartan 24-26 MG Commonly known as: ENTRESTO Take 1 tablet by mouth 2 (two) times daily.   spironolactone 25 MG tablet Commonly known as: ALDACTONE Take 0.5 tablets (12.5 mg total) by mouth daily. Start taking on: Mar 16, 2021            Durable Medical Equipment  (From admission, onward)         Start     Ordered   03/15/21 1249  For home use only DME 4 wheeled rolling walker with seat  Once       Question:  Patient needs Whalen Trompeter walker to treat with the following condition  Answer:  Physical deconditioning   03/15/21 1248   03/15/21 1200  For home use only DME 4 wheeled rolling walker with seat  Once       Question:  Patient needs Michel Eskelson walker to treat with the following condition  Answer:  Fear for personal safety   03/15/21 1200         Allergies  Allergen Reactions  . Lisinopril     Other reaction(s): cough  . Oxycodone Nausea And Vomiting and Other (See Comments)    Intolerance--"its like I was floating up on the ceiling"     Follow-up Information    [facility] Follow up on 03/27/2021.   Specialty: Cardiology Why: Please arrive 15 minutes early for your 10am post-hospital cardiology appointment. [facility]. FREE valet parking at [employer], off [address]. Contact information: [address] [id number] [place] [phone]               The results of significant diagnostics from this hospitalization (including imaging,  microbiology, ancillary and laboratory) are listed below for reference.    Significant Diagnostic Studies: DG Chest 2 View  Result Date: 03/12/2021 CLINICAL DATA:  Shortness of breath EXAM: CHEST - 2 VIEW COMPARISON:  03/13/2015 FINDINGS: Small bilateral pleural effusions. Basilar consolidations. Obscured cardiomediastinal silhouette. Vascular congestion. No pneumothorax IMPRESSION: Vascular congestion with small bilateral effusions. Bibasilar airspace disease, probably atelectasis Electronically Signed   By: Donavan Foil M.D.   On: 03/12/2021 18:22   ECHOCARDIOGRAM COMPLETE  Result Date: 03/13/2021    ECHOCARDIOGRAM REPORT   Patient Name:   KEARSTON KOHN Date of Exam: 03/13/2021 Medical Rec #:  [id number]   Height:       61.0 in Accession #:    [id number]  Weight:       129.0 lb Date of Birth:  1933/03/16   BSA:          1.568 m Patient Age:    87 years    BP:           123/48 mmHg Patient Gender: F           HR:           97 bpm. Exam Location:  Inpatient Procedure: 2D Echo, Cardiac Doppler and Color Doppler Indications:   Congestive Heart Failure I50.9  History:       Patient has no prior history of Echocardiogram examinations.                Nausea.  Sonographer:   Merrie Roof Referring      [phone] [place] Phys: IMPRESSIONS  1. Limited study  Apical images incomplete (apical 2 and 3 chamber not done). Overall, based on previous images LVEF is severely depressed at approximately 20 to 25%. Left ventricular diastolic parameters are indeterminate.  2. Right ventricular systolic function is moderately reduced. The right ventricular size is normal.  3. Left atrial size was mildly dilated.  4. Right atrial size was mildly dilated.  5. The mitral valve is normal in structure. Mild mitral valve regurgitation.  6. The aortic valve is normal in structure. Aortic valve regurgitation is not visualized. FINDINGS  Left Ventricle: The left ventricular internal cavity size was normal in size. Limite studya. Apical  images incomplete (apical 2 and 3 chamber views not done. Overall, based on ther images LVEF is severely depressed at appoximately 20 to 25%. There is no  left ventricular hypertrophy. Left ventricular diastolic parameters are indeterminate. Right Ventricle: The right ventricular size is normal. Right vetricular wall thickness was not assessed. Right ventricular systolic function is moderately reduced. Left Atrium: Left atrial size was mildly dilated. Right Atrium: Right atrial size was mildly dilated. Pericardium: Trivial pericardial effusion is present. Mitral Valve: The mitral valve is normal in structure. Mild mitral valve regurgitation. Tricuspid Valve: The tricuspid valve is normal in structure. Tricuspid valve regurgitation is mild. Aortic Valve: The aortic valve is normal in structure. Aortic valve regurgitation is not visualized. Aortic valve mean gradient measures 4.0 mmHg. Aortic valve peak gradient measures 8.1 mmHg. Aortic valve area, by VTI measures 0.99 cm. Pulmonic Valve: The pulmonic valve was not well visualized. Pulmonic valve regurgitation is not visualized. Aorta: The aortic root is normal in size and structure. Venous: The inferior vena cava was not well visualized. IAS/Shunts: The interatrial septum was not assessed.  LEFT VENTRICLE PLAX 2D LVIDd:         4.61 cm LVIDs:         3.92 cm LV PW:         0.90 cm LV IVS:        0.90 cm LVOT diam:     1.80 cm LV SV:         26 LV SV Index:   17 LVOT Area:     2.54 cm  LEFT ATRIUM           Index       RIGHT ATRIUM           Index LA diam:      3.40 cm 2.17 cm/m  RA Area:     16.50 cm LA Vol (A4C): 60.7 ml 38.72 ml/m RA Volume:   45.60 ml  29.09 ml/m  AORTIC VALVE AV Area (Vmax):    1.16 cm AV Area (Vmean):   1.10 cm AV Area (VTI):     0.99 cm AV Vmax:           142.00 cm/s AV Vmean:          91.400 cm/s AV VTI:            0.263 m AV Peak Grad:      8.1 mmHg AV Mean Grad:      4.0 mmHg LVOT Vmax:         65.00 cm/s LVOT Vmean:        39.500  cm/s LVOT VTI:          0.102 m LVOT/AV VTI ratio: 0.39  AORTA Ao Root diam: 2.80 cm  SHUNTS Systemic VTI:  0.10 m Systemic Diam: 1.80 cm Dorris Carnes MD Electronically signed by Nevin Bloodgood  Ross MD Signature Date/Time: 03/13/2021/5:26:02 PM    Final     Microbiology: Recent Results (from the past 240 hour(s))  Resp Panel by RT-PCR (Flu Trig Mcbryar&B, Covid) Nasopharyngeal Swab     Status: None   Collection Time: 03/12/21  6:15 PM   Specimen: Nasopharyngeal Swab; Nasopharyngeal(NP) swabs in vial transport medium  Result Value Ref Range Status   SARS Coronavirus 2 by RT PCR NEGATIVE NEGATIVE Final    Comment: (NOTE) SARS-CoV-2 target nucleic acids are NOT DETECTED.  The SARS-CoV-2 RNA is generally detectable in upper respiratory specimens during the acute phase of infection. The lowest concentration of SARS-CoV-2 viral copies this assay can detect is 138 copies/mL. Uchechukwu Dhawan negative result does not preclude SARS-Cov-2 infection and should not be used as the sole basis for treatment or other patient management decisions. Elisavet Buehrer negative result may occur with  improper specimen collection/handling, submission of specimen other than nasopharyngeal swab, presence of viral mutation(s) within the areas targeted by this assay, and inadequate number of viral copies(<138 copies/mL). Arelie Kuzel negative result must be combined with clinical observations, patient history, and epidemiological information. The expected result is Negative.  Fact Sheet for Patients:  EntrepreneurPulse.com.au  Fact Sheet for Healthcare Providers:  IncredibleEmployment.be  This test is no t yet approved or cleared by the [place] FDA and  has been authorized for detection and/or diagnosis of SARS-CoV-2 by FDA under an Emergency Use Authorization (EUA). This EUA will remain  in effect (meaning this test can be used) for the duration of the COVID-19 declaration under Section 564(b)(1) of the Act, 21 U.S.C.section  360bbb-3(b)(1), unless the authorization is terminated  or revoked sooner.       Influenza Shaquel Chavous by PCR NEGATIVE NEGATIVE Final   Influenza B by PCR NEGATIVE NEGATIVE Final    Comment: (NOTE) The Xpert Xpress SARS-CoV-2/FLU/RSV plus assay is intended as an aid in the diagnosis of influenza from Nasopharyngeal swab specimens and should not be used as Krystol Rocco sole basis for treatment. Nasal washings and aspirates are unacceptable for Xpert Xpress SARS-CoV-2/FLU/RSV testing.  Fact Sheet for Patients: EntrepreneurPulse.com.au  Fact Sheet for Healthcare Providers: IncredibleEmployment.be  This test is not yet approved or cleared by the [place] FDA and has been authorized for detection and/or diagnosis of SARS-CoV-2 by FDA under an Emergency Use Authorization (EUA). This EUA will remain in effect (meaning this test can be used) for the duration of the COVID-19 declaration under Section 564(b)(1) of the Act, 21 U.S.C. section 360bbb-3(b)(1), unless the authorization is terminated or revoked.  Performed at [facility], [place] [address], [place], [place] [address]      Labs: Basic Metabolic Panel: Recent Labs  Lab 03/12/21 1812 03/13/21 0351 03/14/21 0333 03/15/21 0329  NA 134* 134* 134* 136  K 3.3* 2.9* 3.4* 2.9*  CL 98 99 99 100  CO2 26 23 27 28   GLUCOSE 103* 90 99 85  BUN 32* 26* 23 23  CREATININE 0.81 0.65 0.89 0.75  CALCIUM 8.7* 8.7* 8.4* 8.7*  MG  --  2.0 1.8 1.9   Liver Function Tests: No results for input(s): AST, ALT, ALKPHOS, BILITOT, PROT, ALBUMIN in the last 168 hours. No results for input(s): LIPASE, AMYLASE in the last 168 hours. No results for input(s): AMMONIA in the last 168 hours. CBC: Recent Labs  Lab 03/12/21 1812 03/13/21 0351 03/14/21 0333 03/15/21 0329  WBC 8.2 10.1 9.0 8.8  NEUTROABS 6.0  --  6.2 5.8  HGB 14.0 13.3 13.5 14.0  HCT  43.1 41.1 41.0 42.6  MCV 93.3 93.0 91.1 90.6  PLT 291 244  251 274   Cardiac Enzymes: No results for input(s): CKTOTAL, CKMB, CKMBINDEX, TROPONINI in the last 168 hours. BNP: BNP (last 3 results) Recent Labs    03/12/21 1812  BNP 1,128.3*    ProBNP (last 3 results) No results for input(s): PROBNP in the last 8760 hours.  CBG: No results for input(s): GLUCAP in the last 168 hours.     Signed:  Fayrene Helper MD.  [facility] 03/15/2021, 1:36 PM

## 2021-03-15 NOTE — Progress Notes (Signed)
Pt refused IV potassium infusion. MD made aware. Oral potassium administered. SRP, RN

## 2021-03-15 NOTE — Progress Notes (Signed)
Pt discharged to home, instructions reviewed with son and pt. Both acknowledged understanding. Med education reviewed pt and son. Heart failure education completed, including s/s, home management. Acknowledged understanding. SRP, RN

## 2021-03-15 NOTE — Evaluation (Signed)
Physical Therapy Evaluation Patient Details Name: Amy [name] MRN: [id number] DOB: Apr 06, 1933 Today's Date: 03/15/2021   History of Present Illness  87 year old female who presented to the ER with shortness of breath . Dx of new onset acute CHF, PAF, hypokalemia. Pf with PMH hypertension, hyperlipidemia, anxiety  Clinical Impression  Pt admitted with above diagnosis. Pt ambulated 73' with RW, no loss of balance, distance limited by fatigue but no dyspnea. Pt stated her son is local and can assist as needed at home. Rollator recommended.  Pt currently with functional limitations due to the deficits listed below (see PT Problem List). Pt will benefit from skilled PT to increase their independence and safety with mobility to allow discharge to the venue listed below.       Follow Up Recommendations Home health PT    Equipment Recommendations  Other (comment) (rollator)    Recommendations for Other Services       Precautions / Restrictions Precautions Precautions: None Restrictions Weight Bearing Restrictions: No      Mobility  Bed Mobility Overal bed mobility: Modified Independent                  Transfers Overall transfer level: Needs assistance Equipment used: Rolling walker (2 wheeled) Transfers: Sit to/from Stand Sit to Stand: Min guard         General transfer comment: VCs hand placement  Ambulation/Gait Ambulation/Gait assistance: Min guard Gait Distance (Feet): 65 Feet Assistive device: Rolling walker (2 wheeled) Gait Pattern/deviations: Step-through pattern;Decreased stride length Gait velocity: decr   General Gait Details: steady, no LOB, no dyspnea, distance limited by fatigue, HR 100 walking  Stairs            Wheelchair Mobility    Modified Rankin (Stroke Patients Only)       Balance Overall balance assessment: Modified Independent                                           Pertinent Vitals/Pain Pain Assessment:  No/denies pain    Home Living Family/patient expects to be discharged to:: Private residence Living Arrangements: Alone Available Help at Discharge: Family;Available PRN/intermittently Type of Home: House Home Access: Stairs to enter Entrance Stairs-Rails: Right;Left;Can reach both Entrance Stairs-Number of Steps: 5 Home Layout: One level Home Equipment: Walker - 2 wheels;Cane - single point      Prior Function Level of Independence: Independent         Comments: daughter in [place] is trying to come down to assist, son is local but works but does help some; denies falls in past 1 year     Hand Dominance        Extremity/Trunk Assessment   Upper Extremity Assessment Upper Extremity Assessment: Overall WFL for tasks assessed    Lower Extremity Assessment Lower Extremity Assessment: Overall WFL for tasks assessed    Cervical / Trunk Assessment Cervical / Trunk Assessment: Normal  Communication   Communication: HOH  Cognition Arousal/Alertness: Awake/alert Behavior During Therapy: WFL for tasks assessed/performed Overall Cognitive Status: Within Functional Limits for tasks assessed                                        General Comments      Exercises  ankle pumps 10 both AROM   Assessment/Plan  PT Assessment Patient needs continued PT services  PT Problem List Decreased activity tolerance       PT Treatment Interventions Gait training;Therapeutic activities    PT Goals (Current goals can be found in the Care Plan section)  Acute Rehab PT Goals Patient Stated Goal: get strength back PT Goal Formulation: With patient Time For Goal Achievement: 03/29/21 Potential to Achieve Goals: Good    Frequency Min 3X/week   Barriers to discharge        Co-evaluation               AM-PAC PT "6 Clicks" Mobility  Outcome Measure Help needed turning from your back to your side while in a flat bed without using bedrails?: None Help needed  moving from lying on your back to sitting on the side of a flat bed without using bedrails?: A Little Help needed moving to and from a bed to a chair (including a wheelchair)?: A Little Help needed standing up from a chair using your arms (e.g., wheelchair or bedside chair)?: A Little Help needed to walk in hospital room?: A Little Help needed climbing 3-5 steps with a railing? : A Little 6 Click Score: 19    End of Session Equipment Utilized During Treatment: Gait belt Activity Tolerance: Patient tolerated treatment well Patient left: in chair;with call bell/phone within reach;with chair alarm set Nurse Communication: Mobility status PT Visit Diagnosis: Difficulty in walking, not elsewhere classified (R26.2)    Time: 6294-7654 PT Time Calculation (min) (ACUTE ONLY): 20 min   Charges:   PT Evaluation $PT Eval Low Complexity: 1 Low         Philomena Doheny PT 03/15/2021  Acute Rehabilitation Services Pager [phone] Office [phone]

## 2021-03-15 NOTE — Plan of Care (Signed)
  Problem: Education: Goal: Knowledge of General Education information will improve Description Including pain rating scale, medication(s)/side effects and non-pharmacologic comfort measures Outcome: Progressing   Problem: Health Behavior/Discharge Planning: Goal: Ability to manage health-related needs will improve Outcome: Progressing   

## 2021-03-18 DIAGNOSIS — E876 Hypokalemia: Secondary | ICD-10-CM | POA: Diagnosis not present

## 2021-03-18 DIAGNOSIS — I110 Hypertensive heart disease with heart failure: Secondary | ICD-10-CM | POA: Diagnosis not present

## 2021-03-18 DIAGNOSIS — E785 Hyperlipidemia, unspecified: Secondary | ICD-10-CM | POA: Diagnosis not present

## 2021-03-18 DIAGNOSIS — I099 Rheumatic heart disease, unspecified: Secondary | ICD-10-CM | POA: Diagnosis not present

## 2021-03-18 DIAGNOSIS — I480 Paroxysmal atrial fibrillation: Secondary | ICD-10-CM | POA: Diagnosis not present

## 2021-03-18 DIAGNOSIS — Z9181 History of falling: Secondary | ICD-10-CM | POA: Diagnosis not present

## 2021-03-18 DIAGNOSIS — Z7901 Long term (current) use of anticoagulants: Secondary | ICD-10-CM | POA: Diagnosis not present

## 2021-03-18 DIAGNOSIS — I509 Heart failure, unspecified: Secondary | ICD-10-CM | POA: Diagnosis not present

## 2021-03-21 ENCOUNTER — Other Ambulatory Visit: Payer: Self-pay

## 2021-03-21 ENCOUNTER — Telehealth (HOSPITAL_BASED_OUTPATIENT_CLINIC_OR_DEPARTMENT_OTHER): Payer: Self-pay | Admitting: Cardiovascular Disease

## 2021-03-21 MED ORDER — POTASSIUM CHLORIDE ER 10 MEQ PO TBCR: 20.0000 meq | EXTENDED_RELEASE_TABLET | Freq: Every day | ORAL | 1 refills | Status: DC

## 2021-03-21 MED ORDER — RIVAROXABAN 15 MG PO TABS: 15.0000 mg | ORAL_TABLET | Freq: Every day | ORAL | 1 refills | Status: DC

## 2021-03-21 MED ORDER — FUROSEMIDE 40 MG PO TABS: 40.0000 mg | ORAL_TABLET | Freq: Every day | ORAL | 1 refills | Status: DC

## 2021-03-21 MED ORDER — AMIODARONE HCL 200 MG PO TABS: 200.0000 mg | ORAL_TABLET | Freq: Every day | ORAL | 1 refills | Status: DC

## 2021-03-21 MED ORDER — SPIRONOLACTONE 25 MG PO TABS: 12.5000 mg | ORAL_TABLET | Freq: Every day | ORAL | 1 refills | Status: DC

## 2021-03-21 MED ORDER — SACUBITRIL-VALSARTAN 24-26 MG PO TABS: 1.0000 | ORAL_TABLET | Freq: Two times a day (BID) | ORAL | 1 refills | Status: AC

## 2021-03-21 NOTE — Telephone Encounter (Signed)
Per Dr.O'Neal okay to refill- I sent in 30 days with 1 refill to make sure she is set until she sees [place] 06/30.   Thanks!

## 2021-03-21 NOTE — Telephone Encounter (Signed)
None of the requested medications were prescribed by Roderic Palau NP. Will Forward to Dr. Beau Fanny for refills as medications were ordered at recent hospitalization.

## 2021-03-21 NOTE — Telephone Encounter (Signed)
*  STAT* If patient is at the pharmacy, call can be transferred to refill team.   1. Which medications need to be refilled? (please list name of each medication and dose if known) amiodarone (PACERONE) 200 MG tablet amiodarone (PACERONE) 200 MG tablet furosemide (LASIX) 40 MG tablet potassium chloride (KLOR-CON 10) 10 MEQ tablet Rivaroxaban (XARELTO) 15 MG TABS tablet sacubitril-valsartan (ENTRESTO) 24-26 MG spironolactone (ALDACTONE) 25 MG tablet  2. Which pharmacy/location (including street and city if local pharmacy) is medication to be sent to? [facility] - [place], [place] - [place] [address]  3. Do they need a 30 day or 90 day supply? [place]

## 2021-03-21 NOTE — Telephone Encounter (Signed)
Pt has an upcoming appt in June 2022 with Dr. Audie Box. Would Dr. Audie Box like to refill these medications until appt time? Please address

## 2021-03-21 NOTE — Telephone Encounter (Signed)
This is a A-Fib clinic pt 

## 2021-03-22 DIAGNOSIS — Z7901 Long term (current) use of anticoagulants: Secondary | ICD-10-CM | POA: Diagnosis not present

## 2021-03-22 DIAGNOSIS — E785 Hyperlipidemia, unspecified: Secondary | ICD-10-CM | POA: Diagnosis not present

## 2021-03-22 DIAGNOSIS — I110 Hypertensive heart disease with heart failure: Secondary | ICD-10-CM | POA: Diagnosis not present

## 2021-03-22 DIAGNOSIS — I480 Paroxysmal atrial fibrillation: Secondary | ICD-10-CM | POA: Diagnosis not present

## 2021-03-22 DIAGNOSIS — I509 Heart failure, unspecified: Secondary | ICD-10-CM | POA: Diagnosis not present

## 2021-03-22 DIAGNOSIS — Z9181 History of falling: Secondary | ICD-10-CM | POA: Diagnosis not present

## 2021-03-22 DIAGNOSIS — E876 Hypokalemia: Secondary | ICD-10-CM | POA: Diagnosis not present

## 2021-03-22 DIAGNOSIS — I099 Rheumatic heart disease, unspecified: Secondary | ICD-10-CM | POA: Diagnosis not present

## 2021-03-27 ENCOUNTER — Encounter (HOSPITAL_COMMUNITY): Payer: Medicare Other

## 2021-03-28 ENCOUNTER — Telehealth: Payer: Self-pay | Admitting: Cardiovascular Disease

## 2021-03-28 NOTE — Telephone Encounter (Signed)
New message:     Patient daughter calling she will be sending [place] papers for work to take care of her mother. Please keep a look out. Paper work will be here tomorrow some times.

## 2021-03-29 DIAGNOSIS — E876 Hypokalemia: Secondary | ICD-10-CM | POA: Diagnosis not present

## 2021-03-29 DIAGNOSIS — I110 Hypertensive heart disease with heart failure: Secondary | ICD-10-CM | POA: Diagnosis not present

## 2021-03-29 DIAGNOSIS — E785 Hyperlipidemia, unspecified: Secondary | ICD-10-CM | POA: Diagnosis not present

## 2021-03-29 DIAGNOSIS — I509 Heart failure, unspecified: Secondary | ICD-10-CM | POA: Diagnosis not present

## 2021-03-29 DIAGNOSIS — Z7901 Long term (current) use of anticoagulants: Secondary | ICD-10-CM | POA: Diagnosis not present

## 2021-03-29 DIAGNOSIS — I099 Rheumatic heart disease, unspecified: Secondary | ICD-10-CM | POA: Diagnosis not present

## 2021-03-29 DIAGNOSIS — I480 Paroxysmal atrial fibrillation: Secondary | ICD-10-CM | POA: Diagnosis not present

## 2021-03-29 DIAGNOSIS — Z9181 History of falling: Secondary | ICD-10-CM | POA: Diagnosis not present

## 2021-03-30 DIAGNOSIS — I480 Paroxysmal atrial fibrillation: Secondary | ICD-10-CM | POA: Diagnosis not present

## 2021-03-30 DIAGNOSIS — I5022 Chronic systolic (congestive) heart failure: Secondary | ICD-10-CM | POA: Diagnosis not present

## 2021-03-30 DIAGNOSIS — R5381 Other malaise: Secondary | ICD-10-CM | POA: Diagnosis not present

## 2021-04-04 DIAGNOSIS — Z7901 Long term (current) use of anticoagulants: Secondary | ICD-10-CM | POA: Diagnosis not present

## 2021-04-04 DIAGNOSIS — E785 Hyperlipidemia, unspecified: Secondary | ICD-10-CM | POA: Diagnosis not present

## 2021-04-04 DIAGNOSIS — I509 Heart failure, unspecified: Secondary | ICD-10-CM | POA: Diagnosis not present

## 2021-04-04 DIAGNOSIS — I099 Rheumatic heart disease, unspecified: Secondary | ICD-10-CM | POA: Diagnosis not present

## 2021-04-04 DIAGNOSIS — E876 Hypokalemia: Secondary | ICD-10-CM | POA: Diagnosis not present

## 2021-04-04 DIAGNOSIS — I110 Hypertensive heart disease with heart failure: Secondary | ICD-10-CM | POA: Diagnosis not present

## 2021-04-04 DIAGNOSIS — Z9181 History of falling: Secondary | ICD-10-CM | POA: Diagnosis not present

## 2021-04-04 DIAGNOSIS — I480 Paroxysmal atrial fibrillation: Secondary | ICD-10-CM | POA: Diagnosis not present

## 2021-04-10 ENCOUNTER — Telehealth: Payer: Self-pay | Admitting: Cardiovascular Disease

## 2021-04-10 MED ORDER — CARVEDILOL 6.25 MG PO TABS
6.2500 mg | ORAL_TABLET | Freq: Two times a day (BID) | ORAL | 0 refills | Status: DC
Start: 2021-04-10 — End: 2021-05-04

## 2021-04-10 NOTE — Telephone Encounter (Signed)
*  STAT* If patient is at the pharmacy, call can be transferred to refill team.   1. Which medications need to be refilled? (please list name of each medication and dose if known) carvedilol (COREG) 6.25 MG tablet  2. Which pharmacy/location (including street and city if local pharmacy) is medication to be sent to? [facility] - [place], [place] - [place] [address]  3. Do they need a 30 day or 90 day supply? 30 day   Patient will run out 6/20

## 2021-04-12 DIAGNOSIS — I110 Hypertensive heart disease with heart failure: Secondary | ICD-10-CM | POA: Diagnosis not present

## 2021-04-12 DIAGNOSIS — Z7901 Long term (current) use of anticoagulants: Secondary | ICD-10-CM | POA: Diagnosis not present

## 2021-04-12 DIAGNOSIS — I099 Rheumatic heart disease, unspecified: Secondary | ICD-10-CM | POA: Diagnosis not present

## 2021-04-12 DIAGNOSIS — I509 Heart failure, unspecified: Secondary | ICD-10-CM | POA: Diagnosis not present

## 2021-04-12 DIAGNOSIS — I480 Paroxysmal atrial fibrillation: Secondary | ICD-10-CM | POA: Diagnosis not present

## 2021-04-12 DIAGNOSIS — Z9181 History of falling: Secondary | ICD-10-CM | POA: Diagnosis not present

## 2021-04-12 DIAGNOSIS — E785 Hyperlipidemia, unspecified: Secondary | ICD-10-CM | POA: Diagnosis not present

## 2021-04-12 DIAGNOSIS — E876 Hypokalemia: Secondary | ICD-10-CM | POA: Diagnosis not present

## 2021-04-23 DIAGNOSIS — E785 Hyperlipidemia, unspecified: Secondary | ICD-10-CM | POA: Diagnosis not present

## 2021-04-23 DIAGNOSIS — E876 Hypokalemia: Secondary | ICD-10-CM | POA: Diagnosis not present

## 2021-04-23 DIAGNOSIS — Z7901 Long term (current) use of anticoagulants: Secondary | ICD-10-CM | POA: Diagnosis not present

## 2021-04-23 DIAGNOSIS — I110 Hypertensive heart disease with heart failure: Secondary | ICD-10-CM | POA: Diagnosis not present

## 2021-04-23 DIAGNOSIS — Z9181 History of falling: Secondary | ICD-10-CM | POA: Diagnosis not present

## 2021-04-23 DIAGNOSIS — I509 Heart failure, unspecified: Secondary | ICD-10-CM | POA: Diagnosis not present

## 2021-04-23 DIAGNOSIS — I099 Rheumatic heart disease, unspecified: Secondary | ICD-10-CM | POA: Diagnosis not present

## 2021-04-23 DIAGNOSIS — I480 Paroxysmal atrial fibrillation: Secondary | ICD-10-CM | POA: Diagnosis not present

## 2021-04-24 DIAGNOSIS — I480 Paroxysmal atrial fibrillation: Secondary | ICD-10-CM | POA: Diagnosis not present

## 2021-04-24 DIAGNOSIS — I5032 Chronic diastolic (congestive) heart failure: Secondary | ICD-10-CM | POA: Diagnosis not present

## 2021-04-24 DIAGNOSIS — I4891 Unspecified atrial fibrillation: Secondary | ICD-10-CM | POA: Diagnosis not present

## 2021-04-24 DIAGNOSIS — E7800 Pure hypercholesterolemia, unspecified: Secondary | ICD-10-CM | POA: Diagnosis not present

## 2021-04-24 DIAGNOSIS — E785 Hyperlipidemia, unspecified: Secondary | ICD-10-CM | POA: Diagnosis not present

## 2021-04-24 DIAGNOSIS — I5022 Chronic systolic (congestive) heart failure: Secondary | ICD-10-CM | POA: Diagnosis not present

## 2021-04-24 DIAGNOSIS — M810 Age-related osteoporosis without current pathological fracture: Secondary | ICD-10-CM | POA: Diagnosis not present

## 2021-04-24 DIAGNOSIS — I10 Essential (primary) hypertension: Secondary | ICD-10-CM | POA: Diagnosis not present

## 2021-04-26 ENCOUNTER — Ambulatory Visit (HOSPITAL_BASED_OUTPATIENT_CLINIC_OR_DEPARTMENT_OTHER): Payer: Medicare Other | Admitting: Cardiovascular Disease

## 2021-04-27 DIAGNOSIS — I480 Paroxysmal atrial fibrillation: Secondary | ICD-10-CM | POA: Diagnosis not present

## 2021-04-27 DIAGNOSIS — I110 Hypertensive heart disease with heart failure: Secondary | ICD-10-CM | POA: Diagnosis not present

## 2021-04-27 DIAGNOSIS — I509 Heart failure, unspecified: Secondary | ICD-10-CM | POA: Diagnosis not present

## 2021-04-27 DIAGNOSIS — Z7901 Long term (current) use of anticoagulants: Secondary | ICD-10-CM | POA: Diagnosis not present

## 2021-04-27 DIAGNOSIS — I099 Rheumatic heart disease, unspecified: Secondary | ICD-10-CM | POA: Diagnosis not present

## 2021-04-27 DIAGNOSIS — Z9181 History of falling: Secondary | ICD-10-CM | POA: Diagnosis not present

## 2021-04-27 DIAGNOSIS — E785 Hyperlipidemia, unspecified: Secondary | ICD-10-CM | POA: Diagnosis not present

## 2021-04-27 DIAGNOSIS — E876 Hypokalemia: Secondary | ICD-10-CM | POA: Diagnosis not present

## 2021-04-30 NOTE — Progress Notes (Signed)
Cardiology Office Note:    Date:  05/03/2021  NAME:  Amy [name]    MRN: [id number] DOB:  [birth year]   PCP:  Antony Contras, MD  Cardiologist:  None  Electrophysiologist:  None   Referring MD: Antony Contras, MD   Chief Complaint  Patient presents with   Congestive Heart Failure    History of Present Illness:    Amy [name] is a 87 y.o. female with a hx of systolic HF, pAF, HTN who is being seen today for follow-up of systolic HF.  She was diagnosed with congestive heart failure in May 2022.  Seen in the hospital by me.  She was started on Entresto, Coreg and Aldactone.  She was taking Lasix daily.  She reports she is doing well.  No increased lower extremity edema.  She reports no shortness of breath but reports she has no energy.  No rapid heartbeat sensation.  She was also diagnosed with atrial fibrillation.  EKG shows that she is in sinus rhythm.  She is on amiodarone 20 mg daily.  She is on Xarelto with no issues.  She reports she is tired all the time and sleepy.  She has had home health come out.  She reports her blood pressure has been low.  She does not have any values for me today.  BP in office 114/56.  I do wonder if her blood pressure is a bit too low.  When we met in the hospital we decided on medical management.  Given her age she is not wanting aggressive medical care.  I think this is reasonable.  I suspect her cardiomyopathy could be related to her A. fib and left bundle branch block.  She just needs further titration of medical therapy.  Denies any significant symptoms other than fatigue.  Problem List Systolic HF -EF 03-54% (03/5680) 2. Paroxysmal Afib -CHADSVASC=5 (age, female, HTN) 3. LBBB -QRS 148 msec 4. First degree AV block (260 msec)  Past Medical History: Past Medical History:  Diagnosis Date   Arrhythmia    CHF (congestive heart failure) (HCC)    Hyperlipidemia    Hypertension    Rheumatic heart disease     Past Surgical History: Past Surgical  History:  Procedure Laterality Date   APPENDECTOMY      Current Medications: Current Meds  Medication Sig   buPROPion (WELLBUTRIN XL) 150 MG 24 hr tablet Take 150 mg by mouth daily.   carvedilol (COREG) 6.25 MG tablet Take 1 tablet (6.25 mg total) by mouth 2 (two) times daily with a meal. Please keep appointment.   hydrOXYzine (ATARAX/VISTARIL) 25 MG tablet Take 12.5-25 mg by mouth every 8 (eight) hours as needed for anxiety.   IRON PO Take 1 tablet by mouth daily.   losartan (COZAAR) 25 MG tablet Take 1 tablet (25 mg total) by mouth daily.   rosuvastatin (CRESTOR) 20 MG tablet Take 20 mg by mouth every morning.   [DISCONTINUED] amiodarone (PACERONE) 200 MG tablet Take 1 tablet (200 mg total) by mouth daily. (start on 5/25 after you complete the twice daily dosing)   [DISCONTINUED] sacubitril-valsartan (ENTRESTO) 24-26 MG Take 1 tablet by mouth 2 (two) times daily.     Allergies:    Lisinopril and Oxycodone   Social History: Social History   Socioeconomic History   Marital status: Married    Spouse name: Not on file   Number of children: Not on file   Years of education: Not on file   Highest education level:  Not on file  Occupational History   Not on file  Tobacco Use   Smoking status: Former    Packs/day: 0.50    Years: 30.00    Pack years: 15.00    Types: Cigarettes   Smokeless tobacco: Never  Substance and Sexual Activity   Alcohol use: No   Drug use: No   Sexual activity: Not on file  Other Topics Concern   Not on file  Social History Narrative   Not on file   Social Determinants of Health   Financial Resource Strain: Not on file  Food Insecurity: Not on file  Transportation Needs: Not on file  Physical Activity: Not on file  Stress: Not on file  Social Connections: Not on file     Family History: The patient's family history includes Stroke in her maternal grandmother.  ROS:   All other ROS reviewed and negative. Pertinent positives noted in the  HPI.    EKGs/Labs/Other Studies Reviewed:   The following studies were personally reviewed by me today:  EKG:  EKG is ordered today.  The ekg ordered today demonstrates normal sinus rhythm heart rate 71, first-degree AV block, left bundle branch block, and was personally reviewed by me.   TTE 03/13/2021  1. Limited study Apical images incomplete (apical 2 and 3 chamber not  done). Overall, based on previous images LVEF is severely depressed at  approximately 20 to 25%. Left ventricular diastolic parameters are  indeterminate.   2. Right ventricular systolic function is moderately reduced. The right  ventricular size is normal.   3. Left atrial size was mildly dilated.   4. Right atrial size was mildly dilated.   5. The mitral valve is normal in structure. Mild mitral valve  regurgitation.   6. The aortic valve is normal in structure. Aortic valve regurgitation is  not visualized.   Recent Labs: 03/12/2021: B Natriuretic Peptide 1,128.3 03/13/2021: TSH 3.367 03/15/2021: BUN 23; Creatinine, Ser 0.75; Hemoglobin 14.0; Magnesium 1.9; Platelets 274; Potassium 2.9; Sodium 136   Recent Lipid Panel    Component Value Date/Time   CHOL 143 03/13/2017 1147   TRIG 187 (H) 03/13/2017 1147   HDL 66 03/13/2017 1147   CHOLHDL 2.2 03/13/2017 1147   LDLCALC 40 03/13/2017 1147    Physical Exam:   VS:  BP (!) 114/56   Pulse 71   Ht 4' 11"  (1.499 m)   Wt 120 lb (54.4 kg)   SpO2 91%   BMI 24.24 kg/m    Wt Readings from Last 3 Encounters:  05/03/21 120 lb (54.4 kg)  03/15/21 122 lb 5.7 oz (55.5 kg)  06/06/20 125 lb (56.7 kg)    General: Well nourished, well developed, in no acute distress Head: Atraumatic, normal size  Eyes: PEERLA, EOMI  Neck: Supple, no JVD Endocrine: No thryomegaly Cardiac: Normal S1, S2; RRR; no murmurs, rubs, or gallops Lungs: Clear to auscultation bilaterally, no wheezing, rhonchi or rales  Abd: Soft, nontender, no hepatomegaly  Ext: No edema, pulses  2+ Musculoskeletal: No deformities, BUE and BLE strength normal and equal Skin: Warm and dry, no rashes   Neuro: Alert and oriented to person, place, time, and situation, CNII-XII grossly intact, no focal deficits  Psych: Normal mood and affect   ASSESSMENT:   Amy [name] is a 87 y.o. female who presents for the following: 1. Chronic systolic heart failure (HCC)   2. Paroxysmal atrial fibrillation ([place])   3. Fatigue, unspecified type   4. LBBB (left bundle branch  block)   5. First degree AV block     PLAN:   1. Chronic systolic heart failure ([place]) -Diagnosed with systolic heart failure EF 20-25% May 2022.  This was in the setting of paroxysmal atrial fibrillation and left bundle branch block.  QRS is 148 ms. -Troponins were negative.  No symptoms of chest pain. -I suspect this is a nonischemic or myopathy related to A. fib as well as her left bundle branch block.  We decided in the hospital just pursue medical management.  She is not interested in heart catheterizations or aggressive medical care.  She would just like to pursue medication management. -Her volume status is acceptable.  She is fatigued daily.  Reduce Lasix to 40 mg daily as needed.  She will take potassium with this. -She reports fatigue and low BP.  I suspect she is not going to tolerate Entresto.  Stop Entresto.  Start losartan 12.5 mg daily. -Continue Coreg 6.25 mg twice daily.  She is on Aldactone 12.5 mg daily. -We will repeat a full panel of labs including CBC and BMP.  Need to make sure she is not anemic on Xarelto.  She did have A. fib.  She does have a left arm branch block but I do not suspect worsening conduction disease at this time.  If symptoms persist we may need to pursue a monitor. -Her symptoms could also just be related to fact that her EF is 20-25%.  She does need time for medication management.  I have encouraged regular exercise as well. -We will not pursue an ischemia evaluation.  She reports she would  not want a left heart catheterization.  She would like to just continue with medical management.   -We will plan to repeat an echocardiogram in 3 to 6 months.  2. Paroxysmal atrial fibrillation (HCC) -Diagnosed with paroxysmal atrial fibrillation in the hospital.  Given low EF amiodarone was decided on for rhythm control.  Also suspect this could be the etiology for heart failure. -Continue 200 mg daily.  Likely will drop this to 100 mg in the next few months. -Continue Xarelto 15 mg daily.  No bleeding.  3. Fatigue, unspecified type -Unclear etiology.  Could be related to overmedication.  Stop Entresto as above.  CBC and BMP.  She does have conduction disease with a left bundle and first-degree AV block.  If symptoms do not improve with medication changes as above we likely will pursue a monitor to make sure no further A. fib or worsening conduction disease.  4. LBBB (left bundle branch block) 5. First degree AV block -QRS 148 ms.  Does not quite reach criteria for CRT at this time.  We will give her a trial of medication management anyway.  First-degree AV block 260 ms.  We will need to keep an eye on this.  She is on a beta-blocker and amiodarone.  If symptoms do not improve with medication changes I will recommend a monitor to make sure there is no worsening conduction disease.  Disposition: Return in about 3 months (around 08/03/2021).  Medication Adjustments/Labs and Tests Ordered: Current medicines are reviewed at length with the patient today.  Concerns regarding medicines are outlined above.  Orders Placed This Encounter  Procedures   CBC   Basic metabolic panel   EKG 22-QMGN    Meds ordered this encounter  Medications   amiodarone (PACERONE) 200 MG tablet    Sig: Take 1 tablet (200 mg total) by mouth daily.  Dispense:  90 tablet    Refill:  1   furosemide (LASIX) 40 MG tablet    Sig: Take 1 tablet (40 mg total) by mouth daily as needed for edema.    Dispense:  30 tablet     Refill:  1   potassium chloride (KLOR-CON 10) 10 MEQ tablet    Sig: Take 2 tablets (20 mEq total) by mouth daily as needed. When you take Lasix    Dispense:  60 tablet    Refill:  1   losartan (COZAAR) 25 MG tablet    Sig: Take 1 tablet (25 mg total) by mouth daily.    Dispense:  90 tablet    Refill:  3     Patient Instructions  Medication Instructions:  STOP Entresto  START Losartan 25 mg daily  CHANGE HOW YOU TAKE: Lasix 40 mg as needed for swelling/weight gain. CHANGE HOW YOU TAKE: Potassium as needed when you take Lasix.   *If you need a refill on your cardiac medications before your next appointment, please call your pharmacy*   Lab Work: CBC, BMET (you may go to [facility] office- [address] [place] [address], no lab appointment needed)  If you have labs (blood work) drawn today and your tests are completely normal, you will receive your results only by: [place] (if you have MyChart) OR A paper copy in the mail If you have any lab test that is abnormal or we need to change your treatment, we will call you to review the results.   Follow-Up: At [facility], you and your health needs are our priority.  As part of our continuing mission to provide you with exceptional heart care, we have created designated Provider Care Teams.  These Care Teams include your primary Cardiologist (physician) and Advanced Practice Providers (APPs -  Physician Assistants and Nurse Practitioners) who all work together to provide you with the care you need, when you need it.  We recommend signing up for the patient portal called "MyChart".  Sign up information is provided on this After Visit Summary.  MyChart is used to connect with patients for Virtual Visits (Telemedicine).  Patients are able to view lab/test results, encounter notes, upcoming appointments, etc.  Non-urgent messages can be sent to your provider as well.   To learn more about what you can do with MyChart, go  to [email or web address].    Your next appointment:   3 month(s)  The format for your next appointment:   In Person  Provider:   Eleonore Chiquito, MD   Other Instructions Take BP daily.    Time Spent with Patient: I have spent a total of 35 minutes with patient reviewing hospital notes, telemetry, EKGs, labs and examining the patient as well as establishing an assessment and plan that was discussed with the patient.  > 50% of time was spent in direct patient care.  Signed, Addison Naegeli. Audie Box, MD, [place]  [address] [place], [place] [address] [phone]  05/03/2021 9:04 AM

## 2021-05-02 DIAGNOSIS — E785 Hyperlipidemia, unspecified: Secondary | ICD-10-CM | POA: Diagnosis not present

## 2021-05-02 DIAGNOSIS — I480 Paroxysmal atrial fibrillation: Secondary | ICD-10-CM | POA: Diagnosis not present

## 2021-05-02 DIAGNOSIS — I110 Hypertensive heart disease with heart failure: Secondary | ICD-10-CM | POA: Diagnosis not present

## 2021-05-02 DIAGNOSIS — I509 Heart failure, unspecified: Secondary | ICD-10-CM | POA: Diagnosis not present

## 2021-05-02 DIAGNOSIS — Z9181 History of falling: Secondary | ICD-10-CM | POA: Diagnosis not present

## 2021-05-02 DIAGNOSIS — E876 Hypokalemia: Secondary | ICD-10-CM | POA: Diagnosis not present

## 2021-05-02 DIAGNOSIS — I099 Rheumatic heart disease, unspecified: Secondary | ICD-10-CM | POA: Diagnosis not present

## 2021-05-02 DIAGNOSIS — Z7901 Long term (current) use of anticoagulants: Secondary | ICD-10-CM | POA: Diagnosis not present

## 2021-05-03 ENCOUNTER — Telehealth: Payer: Self-pay | Admitting: Cardiovascular Disease

## 2021-05-03 ENCOUNTER — Ambulatory Visit (HOSPITAL_BASED_OUTPATIENT_CLINIC_OR_DEPARTMENT_OTHER): Payer: Medicare Other | Admitting: Cardiovascular Disease

## 2021-05-03 ENCOUNTER — Other Ambulatory Visit: Payer: Self-pay

## 2021-05-03 ENCOUNTER — Encounter (HOSPITAL_BASED_OUTPATIENT_CLINIC_OR_DEPARTMENT_OTHER): Payer: Self-pay | Admitting: Cardiovascular Disease

## 2021-05-03 VITALS — BP 114/56 | HR 71 | Ht 59.0 in | Wt 120.0 lb

## 2021-05-03 DIAGNOSIS — I5022 Chronic systolic (congestive) heart failure: Secondary | ICD-10-CM

## 2021-05-03 DIAGNOSIS — R5383 Other fatigue: Secondary | ICD-10-CM

## 2021-05-03 DIAGNOSIS — I480 Paroxysmal atrial fibrillation: Secondary | ICD-10-CM | POA: Diagnosis not present

## 2021-05-03 DIAGNOSIS — I447 Left bundle-branch block, unspecified: Secondary | ICD-10-CM

## 2021-05-03 DIAGNOSIS — I440 Atrioventricular block, first degree: Secondary | ICD-10-CM

## 2021-05-03 MED ORDER — AMIODARONE HCL 200 MG PO TABS
200.0000 mg | ORAL_TABLET | Freq: Every day | ORAL | 1 refills | Status: DC
Start: 2021-05-03 — End: 2021-08-27

## 2021-05-03 MED ORDER — LOSARTAN POTASSIUM 25 MG PO TABS: 25.0000 mg | ORAL_TABLET | Freq: Every day | ORAL | 3 refills | Status: DC

## 2021-05-03 MED ORDER — POTASSIUM CHLORIDE ER 10 MEQ PO TBCR: 20.0000 meq | EXTENDED_RELEASE_TABLET | Freq: Every day | ORAL | 1 refills | Status: DC | PRN

## 2021-05-03 MED ORDER — FUROSEMIDE 40 MG PO TABS: 40.0000 mg | ORAL_TABLET | Freq: Every day | ORAL | 1 refills | Status: DC | PRN

## 2021-05-03 NOTE — Telephone Encounter (Signed)
Pt c/o medication issue: 1. Name of Medication: Losartan  2. How are you currently taking this medication (dosage and times per day)? One aday 3. Are you having a reaction (difficulty breathing--STAT)?  No 4. What is your medication issue?  MG has been changed and the patient has some at home that are 100 mg and like for some one to call and advise.

## 2021-05-03 NOTE — Patient Instructions (Signed)
Medication Instructions:  STOP Entresto  START Losartan 25 mg daily  CHANGE HOW YOU TAKE: Lasix 40 mg as needed for swelling/weight gain. CHANGE HOW YOU TAKE: Potassium as needed when you take Lasix.   *If you need a refill on your cardiac medications before your next appointment, please call your pharmacy*   Lab Work: CBC, BMET (you may go to [facility] office- [address] [place] [address], no lab appointment needed)  If you have labs (blood work) drawn today and your tests are completely normal, you will receive your results only by: [place] (if you have MyChart) OR A paper copy in the mail If you have any lab test that is abnormal or we need to change your treatment, we will call you to review the results.   Follow-Up: At [facility], you and your health needs are our priority.  As part of our continuing mission to provide you with exceptional heart care, we have created designated Provider Care Teams.  These Care Teams include your primary Cardiologist (physician) and Advanced Practice Providers (APPs -  Physician Assistants and Nurse Practitioners) who all work together to provide you with the care you need, when you need it.  We recommend signing up for the patient portal called "MyChart".  Sign up information is provided on this After Visit Summary.  MyChart is used to connect with patients for Virtual Visits (Telemedicine).  Patients are able to view lab/test results, encounter notes, upcoming appointments, etc.  Non-urgent messages can be sent to your provider as well.   To learn more about what you can do with MyChart, go to [email or web address].    Your next appointment:   3 month(s)  The format for your next appointment:   In Person  Provider:   Eleonore Chiquito, MD   Other Instructions Take BP daily.

## 2021-05-03 NOTE — Telephone Encounter (Signed)
Pt son calling to clarify new medication orders. He states his mother had previously been on Losartan 100mg  qd and wondered why her new dose was 25mg . I advised him Dr. Kathalene Frames note mentioned she had been having periods of low blood pressure and he likely wanted to start at a lower dose and increase as needed for BP management. He was encouraged to monitor her BP a few hours after her daily morning medications (including BP meds) and report consistent high readings SBP>140.   He verbalized understanding and had no additional questions.

## 2021-05-04 ENCOUNTER — Other Ambulatory Visit: Payer: Self-pay | Admitting: Cardiovascular Disease

## 2021-05-08 DIAGNOSIS — I509 Heart failure, unspecified: Secondary | ICD-10-CM | POA: Diagnosis not present

## 2021-05-08 DIAGNOSIS — E876 Hypokalemia: Secondary | ICD-10-CM | POA: Diagnosis not present

## 2021-05-08 DIAGNOSIS — Z7901 Long term (current) use of anticoagulants: Secondary | ICD-10-CM | POA: Diagnosis not present

## 2021-05-08 DIAGNOSIS — I099 Rheumatic heart disease, unspecified: Secondary | ICD-10-CM | POA: Diagnosis not present

## 2021-05-08 DIAGNOSIS — I480 Paroxysmal atrial fibrillation: Secondary | ICD-10-CM | POA: Diagnosis not present

## 2021-05-08 DIAGNOSIS — E785 Hyperlipidemia, unspecified: Secondary | ICD-10-CM | POA: Diagnosis not present

## 2021-05-08 DIAGNOSIS — Z9181 History of falling: Secondary | ICD-10-CM | POA: Diagnosis not present

## 2021-05-08 DIAGNOSIS — I110 Hypertensive heart disease with heart failure: Secondary | ICD-10-CM | POA: Diagnosis not present

## 2021-05-11 DIAGNOSIS — I4891 Unspecified atrial fibrillation: Secondary | ICD-10-CM | POA: Diagnosis not present

## 2021-05-11 DIAGNOSIS — I480 Paroxysmal atrial fibrillation: Secondary | ICD-10-CM | POA: Diagnosis not present

## 2021-05-11 DIAGNOSIS — I5032 Chronic diastolic (congestive) heart failure: Secondary | ICD-10-CM | POA: Diagnosis not present

## 2021-05-11 DIAGNOSIS — M810 Age-related osteoporosis without current pathological fracture: Secondary | ICD-10-CM | POA: Diagnosis not present

## 2021-05-11 DIAGNOSIS — I5022 Chronic systolic (congestive) heart failure: Secondary | ICD-10-CM | POA: Diagnosis not present

## 2021-05-11 DIAGNOSIS — I10 Essential (primary) hypertension: Secondary | ICD-10-CM | POA: Diagnosis not present

## 2021-05-11 DIAGNOSIS — E7800 Pure hypercholesterolemia, unspecified: Secondary | ICD-10-CM | POA: Diagnosis not present

## 2021-05-11 DIAGNOSIS — E785 Hyperlipidemia, unspecified: Secondary | ICD-10-CM | POA: Diagnosis not present

## 2021-05-24 DIAGNOSIS — I5032 Chronic diastolic (congestive) heart failure: Secondary | ICD-10-CM | POA: Diagnosis not present

## 2021-05-30 ENCOUNTER — Other Ambulatory Visit: Payer: Self-pay | Admitting: Cardiovascular Disease

## 2021-05-30 NOTE — Telephone Encounter (Signed)
Prescription refill request for Xarelto received.  Indication:afib Last office visit:oneal 05/03/21 Weight:54.4kg Age:18fScr:0.75 03/15/21 CrCl:46.2

## 2021-06-22 DIAGNOSIS — I480 Paroxysmal atrial fibrillation: Secondary | ICD-10-CM | POA: Diagnosis not present

## 2021-06-22 DIAGNOSIS — E785 Hyperlipidemia, unspecified: Secondary | ICD-10-CM | POA: Diagnosis not present

## 2021-06-22 DIAGNOSIS — I5022 Chronic systolic (congestive) heart failure: Secondary | ICD-10-CM | POA: Diagnosis not present

## 2021-06-22 DIAGNOSIS — I10 Essential (primary) hypertension: Secondary | ICD-10-CM | POA: Diagnosis not present

## 2021-06-22 DIAGNOSIS — I4891 Unspecified atrial fibrillation: Secondary | ICD-10-CM | POA: Diagnosis not present

## 2021-06-22 DIAGNOSIS — E7800 Pure hypercholesterolemia, unspecified: Secondary | ICD-10-CM | POA: Diagnosis not present

## 2021-06-22 DIAGNOSIS — M810 Age-related osteoporosis without current pathological fracture: Secondary | ICD-10-CM | POA: Diagnosis not present

## 2021-06-22 DIAGNOSIS — I5032 Chronic diastolic (congestive) heart failure: Secondary | ICD-10-CM | POA: Diagnosis not present

## 2021-06-27 DIAGNOSIS — I5032 Chronic diastolic (congestive) heart failure: Secondary | ICD-10-CM | POA: Diagnosis not present

## 2021-07-01 ENCOUNTER — Other Ambulatory Visit: Payer: Self-pay | Admitting: Cardiovascular Disease

## 2021-07-04 DIAGNOSIS — I10 Essential (primary) hypertension: Secondary | ICD-10-CM | POA: Diagnosis not present

## 2021-07-04 DIAGNOSIS — E7800 Pure hypercholesterolemia, unspecified: Secondary | ICD-10-CM | POA: Diagnosis not present

## 2021-07-04 DIAGNOSIS — I5032 Chronic diastolic (congestive) heart failure: Secondary | ICD-10-CM | POA: Diagnosis not present

## 2021-07-04 DIAGNOSIS — E785 Hyperlipidemia, unspecified: Secondary | ICD-10-CM | POA: Diagnosis not present

## 2021-07-04 DIAGNOSIS — M810 Age-related osteoporosis without current pathological fracture: Secondary | ICD-10-CM | POA: Diagnosis not present

## 2021-07-04 DIAGNOSIS — I480 Paroxysmal atrial fibrillation: Secondary | ICD-10-CM | POA: Diagnosis not present

## 2021-07-04 DIAGNOSIS — I5022 Chronic systolic (congestive) heart failure: Secondary | ICD-10-CM | POA: Diagnosis not present

## 2021-07-04 DIAGNOSIS — I4891 Unspecified atrial fibrillation: Secondary | ICD-10-CM | POA: Diagnosis not present

## 2021-07-05 ENCOUNTER — Other Ambulatory Visit: Payer: Self-pay | Admitting: Cardiovascular Disease

## 2021-08-22 ENCOUNTER — Other Ambulatory Visit: Payer: Self-pay | Admitting: Cardiovascular Disease

## 2021-08-27 ENCOUNTER — Other Ambulatory Visit: Payer: Self-pay

## 2021-08-27 ENCOUNTER — Ambulatory Visit: Payer: Medicare Other | Admitting: Cardiovascular Disease

## 2021-08-27 ENCOUNTER — Encounter: Payer: Self-pay | Admitting: Cardiovascular Disease

## 2021-08-27 VITALS — BP 142/64 | HR 71 | Ht 60.0 in | Wt 129.0 lb

## 2021-08-27 DIAGNOSIS — E785 Hyperlipidemia, unspecified: Secondary | ICD-10-CM | POA: Diagnosis not present

## 2021-08-27 DIAGNOSIS — E7800 Pure hypercholesterolemia, unspecified: Secondary | ICD-10-CM | POA: Diagnosis not present

## 2021-08-27 DIAGNOSIS — M810 Age-related osteoporosis without current pathological fracture: Secondary | ICD-10-CM | POA: Diagnosis not present

## 2021-08-27 DIAGNOSIS — I480 Paroxysmal atrial fibrillation: Secondary | ICD-10-CM | POA: Diagnosis not present

## 2021-08-27 DIAGNOSIS — I5022 Chronic systolic (congestive) heart failure: Secondary | ICD-10-CM | POA: Diagnosis not present

## 2021-08-27 DIAGNOSIS — I5032 Chronic diastolic (congestive) heart failure: Secondary | ICD-10-CM | POA: Diagnosis not present

## 2021-08-27 DIAGNOSIS — I440 Atrioventricular block, first degree: Secondary | ICD-10-CM | POA: Diagnosis not present

## 2021-08-27 DIAGNOSIS — I447 Left bundle-branch block, unspecified: Secondary | ICD-10-CM

## 2021-08-27 DIAGNOSIS — I10 Essential (primary) hypertension: Secondary | ICD-10-CM | POA: Diagnosis not present

## 2021-08-27 MED ORDER — AMIODARONE HCL 100 MG PO TABS: 100.0000 mg | ORAL_TABLET | Freq: Every day | ORAL | 1 refills | Status: DC

## 2021-08-27 NOTE — Progress Notes (Signed)
Cardiology Office Note:   Date:  08/27/2021  NAME:  Amy [name]    MRN: [id number] DOB:  Jul 08, 1933   PCP:  Antony Contras, MD  Cardiologist:  None  Electrophysiologist:  None   Referring MD: Antony Contras, MD   Chief Complaint  Patient presents with   Follow-up         History of Present Illness:   Amy [name] is a 87 y.o. female with a hx of systolic HF, Afib, LBBB who presents for follow-up. Diagnosed with CHF in May of 2022. Has opted for med management.  She reports she is feeling better.  We will stop Entresto at her last visit.  Her blood pressure was low when she had low energy.  She seems to be doing much better.  She is not requiring Lasix.  She is continued on Coreg, losartan and Aldactone.  She is taking Xarelto.  She reports no chest pain or significant trouble breathing.  She is exercising in her home and walking without major limitations.  Her son presents with her.  She seems to be doing much better.  Her weight is stable.  Blood pressure is 142/64.  We again discussed her goals of care.  She informed me she is not interested in invasive procedures.  She would like continue with medications for now.  Problem List Systolic HF -EF 67-67% (11/945) 2. Paroxysmal Afib -CHADSVASC=5 (age, female, HTN) 3. LBBB -QRS 148 msec 4. First degree AV block (260 msec)  Past Medical History: Past Medical History:  Diagnosis Date   Arrhythmia    CHF (congestive heart failure) (HCC)    Hyperlipidemia    Hypertension    Rheumatic heart disease     Past Surgical History: Past Surgical History:  Procedure Laterality Date   APPENDECTOMY      Current Medications: Current Meds  Medication Sig   buPROPion (WELLBUTRIN XL) 150 MG 24 hr tablet Take 150 mg by mouth daily.   carvedilol (COREG) 6.25 MG tablet TAKE ONE TABLET BY MOUTH TWICE DAILY WITH FOOD   hydrOXYzine (ATARAX/VISTARIL) 25 MG tablet Take 12.5-25 mg by mouth every 8 (eight) hours as needed for anxiety.   IRON PO  Take 1 tablet by mouth daily.   losartan (COZAAR) 25 MG tablet Take 1 tablet (25 mg total) by mouth daily.   Rivaroxaban (XARELTO) 15 MG TABS tablet TAKE ONE TABLET BY MOUTH EVERY EVENING   rosuvastatin (CRESTOR) 20 MG tablet Take 20 mg by mouth every morning.   spironolactone (ALDACTONE) 25 MG tablet TAKE 1/2 TABLET BY MOUTH ONCE DAILY   [DISCONTINUED] amiodarone (PACERONE) 200 MG tablet Take 1 tablet (200 mg total) by mouth daily.     Allergies:    Lisinopril and Oxycodone   Social History: Social History   Socioeconomic History   Marital status: Married    Spouse name: Not on file   Number of children: Not on file   Years of education: Not on file   Highest education level: Not on file  Occupational History   Not on file  Tobacco Use   Smoking status: Former    Packs/day: 0.50    Years: 30.00    Pack years: 15.00    Types: Cigarettes   Smokeless tobacco: Never  Substance and Sexual Activity   Alcohol use: No   Drug use: No   Sexual activity: Not on file  Other Topics Concern   Not on file  Social History Narrative   Not on file  Social Determinants of Health   Financial Resource Strain: Not on file  Food Insecurity: Not on file  Transportation Needs: Not on file  Physical Activity: Not on file  Stress: Not on file  Social Connections: Not on file     Family History: The patient's family history includes Stroke in her maternal grandmother.  ROS:   All other ROS reviewed and negative. Pertinent positives noted in the HPI.     EKGs/Labs/Other Studies Reviewed:   The following studies were personally reviewed by me today:  TTE 03/13/2021  1. Limited study Apical images incomplete (apical 2 and 3 chamber not  done). Overall, based on previous images LVEF is severely depressed at  approximately 20 to 25%. Left ventricular diastolic parameters are  indeterminate.   2. Right ventricular systolic function is moderately reduced. The right  ventricular size is  normal.   3. Left atrial size was mildly dilated.   4. Right atrial size was mildly dilated.   5. The mitral valve is normal in structure. Mild mitral valve  regurgitation.   6. The aortic valve is normal in structure. Aortic valve regurgitation is  not visualized.   Recent Labs: 03/12/2021: B Natriuretic Peptide 1,128.3 03/13/2021: TSH 3.367 03/15/2021: BUN 23; Creatinine, Ser 0.75; Hemoglobin 14.0; Magnesium 1.9; Platelets 274; Potassium 2.9; Sodium 136   Recent Lipid Panel    Component Value Date/Time   CHOL 143 03/13/2017 1147   TRIG 187 (H) 03/13/2017 1147   HDL 66 03/13/2017 1147   CHOLHDL 2.2 03/13/2017 1147   LDLCALC 40 03/13/2017 1147    Physical Exam:   VS:  BP (!) 142/64 (BP Location: Left Arm, Patient Position: Sitting, Cuff Size: Normal)   Pulse 71   Ht 5' (1.524 m)   Wt 129 lb (58.5 kg)   SpO2 98%   BMI 25.19 kg/m    Wt Readings from Last 3 Encounters:  08/27/21 129 lb (58.5 kg)  05/03/21 120 lb (54.4 kg)  03/15/21 122 lb 5.7 oz (55.5 kg)    General: Well nourished, well developed, in no acute distress Head: Atraumatic, normal size  Eyes: PEERLA, EOMI  Neck: Supple, no JVD Endocrine: No thryomegaly Cardiac: Normal S1, S2; RRR; no murmurs, rubs, or gallops Lungs: Clear to auscultation bilaterally, no wheezing, rhonchi or rales  Abd: Soft, nontender, no hepatomegaly  Ext: No edema, pulses 2+ Musculoskeletal: No deformities, BUE and BLE strength normal and equal Skin: Warm and dry, no rashes   Neuro: Alert and oriented to person, place, time, and situation, CNII-XII grossly intact, no focal deficits  Psych: Normal [name] and affect   ASSESSMENT:   Amy [name] is a 87 y.o. female who presents for the following: 1. Chronic systolic heart failure ([place])   2. LBBB (left bundle branch block)   3. Paroxysmal atrial fibrillation (HCC)   4. First degree AV block     PLAN:   1. Chronic systolic heart failure ([place]) -Diagnosed with congestive heart failure in May  2022.  EF was 20-25%.  She opted against an ischemic evaluation.  She reports she would like to continue with a conservative approach.  I suspect her systolic heart failure may be due to A. fib that she was having in the hospital in the setting of a left bundle branch block.  She was having paroxysms of A. fib.  It may have been more frequent at home.  We have pursued a rhythm control strategy.  Seems to be doing better on this.  We  will reduce her amiodarone to 100 mg daily.  She did not tolerate Entresto due to fatigue and hypotension.  She has continued on Coreg 6.25 mg twice daily, losartan 25 mg daily, Aldactone 12.5 mg daily.  She is doing well and euvolemic.  She takes Lasix as needed.  I would like to recheck an echocardiogram.  I suspect her heart function has improved.  Regardless she reports she not interested in ICD.  She will continue with medical therapy.  She also informs me she would like to defer any cardiac catheterization as she feels good.  I think medical management is reasonable given her age and wishes.  2. LBBB (left bundle branch block) -QRS 148 ms.  3. Paroxysmal atrial fibrillation (HCC) -Paroxysmal atrial fibrillation.  We pursue rhythm control strategy with amiodarone given low EF.  We will reduce her amiodarone to 100 mg daily.  She will need yearly TSH and chest x-ray.  We will also refer her to ophthalmology next year.  Overall seems to be doing well no major bleeding on Xarelto.  We will continue this.  4. First degree AV block -No issues.  Disposition: Return in about 6 months (around 02/24/2022).  Medication Adjustments/Labs and Tests Ordered: Current medicines are reviewed at length with the patient today.  Concerns regarding medicines are outlined above.  No orders of the defined types were placed in this encounter.  Meds ordered this encounter  Medications   amiodarone (PACERONE) 100 MG tablet    Sig: Take 1 tablet (100 mg total) by mouth daily.    Dispense:   90 tablet    Refill:  1     Patient Instructions  Medication Instructions:  Decrease Amiodarone to 100 mg daily   *If you need a refill on your cardiac medications before your next appointment, please call your pharmacy*   Follow-Up: At [facility], you and your health needs are our priority.  As part of our continuing mission to provide you with exceptional heart care, we have created designated Provider Care Teams.  These Care Teams include your primary Cardiologist (physician) and Advanced Practice Providers (APPs -  Physician Assistants and Nurse Practitioners) who all work together to provide you with the care you need, when you need it.  We recommend signing up for the patient portal called "MyChart".  Sign up information is provided on this After Visit Summary.  MyChart is used to connect with patients for Virtual Visits (Telemedicine).  Patients are able to view lab/test results, encounter notes, upcoming appointments, etc.  Non-urgent messages can be sent to your provider as well.   To learn more about what you can do with MyChart, go to [email or web address].    Your next appointment:   6 month(s)  The format for your next appointment:   In Person  Provider:   Eleonore Chiquito, MD      Time Spent with Patient: I have spent a total of 35 minutes with patient reviewing hospital notes, telemetry, EKGs, labs and examining the patient as well as establishing an assessment and plan that was discussed with the patient.  > 50% of time was spent in direct patient care.  Signed, Addison Naegeli. Audie Box, MD, [place]  [address], [place], [place] [address] [phone]  08/27/2021 4:18 PM

## 2021-08-27 NOTE — Patient Instructions (Signed)
Medication Instructions:  Decrease Amiodarone to 100 mg daily   *If you need a refill on your cardiac medications before your next appointment, please call your pharmacy*   Follow-Up: At [facility], you and your health needs are our priority.  As part of our continuing mission to provide you with exceptional heart care, we have created designated Provider Care Teams.  These Care Teams include your primary Cardiologist (physician) and Advanced Practice Providers (APPs -  Physician Assistants and Nurse Practitioners) who all work together to provide you with the care you need, when you need it.  We recommend signing up for the patient portal called "MyChart".  Sign up information is provided on this After Visit Summary.  MyChart is used to connect with patients for Virtual Visits (Telemedicine).  Patients are able to view lab/test results, encounter notes, upcoming appointments, etc.  Non-urgent messages can be sent to your provider as well.   To learn more about what you can do with MyChart, go to [email or web address].    Your next appointment:   6 month(s)  The format for your next appointment:   In Person  Provider:   Onaway O'Neal, MD     

## 2021-08-29 DIAGNOSIS — E7800 Pure hypercholesterolemia, unspecified: Secondary | ICD-10-CM | POA: Diagnosis not present

## 2021-08-29 DIAGNOSIS — I5022 Chronic systolic (congestive) heart failure: Secondary | ICD-10-CM | POA: Diagnosis not present

## 2021-08-29 DIAGNOSIS — I480 Paroxysmal atrial fibrillation: Secondary | ICD-10-CM | POA: Diagnosis not present

## 2021-08-29 DIAGNOSIS — I5032 Chronic diastolic (congestive) heart failure: Secondary | ICD-10-CM | POA: Diagnosis not present

## 2021-08-29 DIAGNOSIS — I10 Essential (primary) hypertension: Secondary | ICD-10-CM | POA: Diagnosis not present

## 2021-08-29 DIAGNOSIS — E785 Hyperlipidemia, unspecified: Secondary | ICD-10-CM | POA: Diagnosis not present

## 2021-08-29 DIAGNOSIS — M810 Age-related osteoporosis without current pathological fracture: Secondary | ICD-10-CM | POA: Diagnosis not present

## 2021-09-05 ENCOUNTER — Telehealth: Payer: Self-pay

## 2021-09-05 NOTE — Telephone Encounter (Signed)
**Note De-Identified Seline Enzor Obfuscation** Amiodarone PA started through covermymeds. Key: [id number]

## 2021-09-11 NOTE — Telephone Encounter (Signed)
**Note De-Identified Kohler Pellerito Obfuscation** [place] Key: [id number] - PA Case ID: [id number] - Rx #: [id number] Outcome: Approved on November 9 Request Reference Number: [id number]. AMIODARONE TAB 100MG  is approved through 10/27/2022. Your patient may now fill this prescription and it will be covered. Drug: Amiodarone HCl 100MG  tablets Form: [employer] Medicare Part D Electronic Prior   I have notified [place] of this approval.

## 2021-10-01 DIAGNOSIS — I5032 Chronic diastolic (congestive) heart failure: Secondary | ICD-10-CM | POA: Diagnosis not present

## 2021-10-01 DIAGNOSIS — I4891 Unspecified atrial fibrillation: Secondary | ICD-10-CM | POA: Diagnosis not present

## 2021-10-01 DIAGNOSIS — E7800 Pure hypercholesterolemia, unspecified: Secondary | ICD-10-CM | POA: Diagnosis not present

## 2021-10-01 DIAGNOSIS — I5022 Chronic systolic (congestive) heart failure: Secondary | ICD-10-CM | POA: Diagnosis not present

## 2021-10-01 DIAGNOSIS — I10 Essential (primary) hypertension: Secondary | ICD-10-CM | POA: Diagnosis not present

## 2021-10-01 DIAGNOSIS — E785 Hyperlipidemia, unspecified: Secondary | ICD-10-CM | POA: Diagnosis not present

## 2021-10-01 DIAGNOSIS — I480 Paroxysmal atrial fibrillation: Secondary | ICD-10-CM | POA: Diagnosis not present

## 2021-10-01 DIAGNOSIS — M810 Age-related osteoporosis without current pathological fracture: Secondary | ICD-10-CM | POA: Diagnosis not present

## 2021-10-21 ENCOUNTER — Other Ambulatory Visit: Payer: Self-pay | Admitting: Cardiovascular Disease

## 2021-10-25 DIAGNOSIS — I5032 Chronic diastolic (congestive) heart failure: Secondary | ICD-10-CM | POA: Diagnosis not present

## 2021-11-26 ENCOUNTER — Other Ambulatory Visit: Payer: Self-pay | Admitting: Cardiovascular Disease

## 2021-11-26 NOTE — Telephone Encounter (Signed)
Prescription refill request for Xarelto received.  Indication:Afib Last office visit:10/22 Weight:58.5 kg Age:88 Scr:0.7 CrCl:52.29 ml/min  Prescription refilled

## 2021-11-27 DIAGNOSIS — I480 Paroxysmal atrial fibrillation: Secondary | ICD-10-CM | POA: Diagnosis not present

## 2021-11-27 DIAGNOSIS — E785 Hyperlipidemia, unspecified: Secondary | ICD-10-CM | POA: Diagnosis not present

## 2021-11-27 DIAGNOSIS — I5022 Chronic systolic (congestive) heart failure: Secondary | ICD-10-CM | POA: Diagnosis not present

## 2021-11-27 DIAGNOSIS — I10 Essential (primary) hypertension: Secondary | ICD-10-CM | POA: Diagnosis not present

## 2021-11-27 DIAGNOSIS — I5032 Chronic diastolic (congestive) heart failure: Secondary | ICD-10-CM | POA: Diagnosis not present

## 2021-11-27 DIAGNOSIS — I4891 Unspecified atrial fibrillation: Secondary | ICD-10-CM | POA: Diagnosis not present

## 2021-11-27 DIAGNOSIS — E7800 Pure hypercholesterolemia, unspecified: Secondary | ICD-10-CM | POA: Diagnosis not present

## 2021-11-27 DIAGNOSIS — M810 Age-related osteoporosis without current pathological fracture: Secondary | ICD-10-CM | POA: Diagnosis not present

## 2021-12-20 ENCOUNTER — Other Ambulatory Visit: Payer: Self-pay | Admitting: Cardiovascular Disease

## 2021-12-24 DIAGNOSIS — I5032 Chronic diastolic (congestive) heart failure: Secondary | ICD-10-CM | POA: Diagnosis not present

## 2021-12-27 DIAGNOSIS — I10 Essential (primary) hypertension: Secondary | ICD-10-CM | POA: Diagnosis not present

## 2021-12-27 DIAGNOSIS — I5022 Chronic systolic (congestive) heart failure: Secondary | ICD-10-CM | POA: Diagnosis not present

## 2021-12-27 DIAGNOSIS — I480 Paroxysmal atrial fibrillation: Secondary | ICD-10-CM | POA: Diagnosis not present

## 2021-12-27 DIAGNOSIS — D6869 Other thrombophilia: Secondary | ICD-10-CM | POA: Diagnosis not present

## 2021-12-27 DIAGNOSIS — E7800 Pure hypercholesterolemia, unspecified: Secondary | ICD-10-CM | POA: Diagnosis not present

## 2021-12-27 DIAGNOSIS — Z9889 Other specified postprocedural states: Secondary | ICD-10-CM | POA: Diagnosis not present

## 2021-12-31 DIAGNOSIS — E7800 Pure hypercholesterolemia, unspecified: Secondary | ICD-10-CM | POA: Diagnosis not present

## 2021-12-31 DIAGNOSIS — I10 Essential (primary) hypertension: Secondary | ICD-10-CM | POA: Diagnosis not present

## 2022-01-25 ENCOUNTER — Other Ambulatory Visit: Payer: Self-pay | Admitting: Cardiovascular Disease

## 2022-01-25 DIAGNOSIS — I5032 Chronic diastolic (congestive) heart failure: Secondary | ICD-10-CM | POA: Diagnosis not present

## 2022-02-05 DIAGNOSIS — I5022 Chronic systolic (congestive) heart failure: Secondary | ICD-10-CM | POA: Diagnosis not present

## 2022-02-05 DIAGNOSIS — I10 Essential (primary) hypertension: Secondary | ICD-10-CM | POA: Diagnosis not present

## 2022-02-05 DIAGNOSIS — E7800 Pure hypercholesterolemia, unspecified: Secondary | ICD-10-CM | POA: Diagnosis not present

## 2022-02-19 ENCOUNTER — Other Ambulatory Visit: Payer: Self-pay | Admitting: Physician Assistant

## 2022-02-19 ENCOUNTER — Ambulatory Visit: Payer: Medicare Other | Admitting: Physician Assistant

## 2022-02-19 ENCOUNTER — Encounter: Payer: Self-pay | Admitting: Physician Assistant

## 2022-02-19 ENCOUNTER — Other Ambulatory Visit: Payer: Self-pay

## 2022-02-19 VITALS — BP 134/56 | HR 72 | Ht 60.0 in | Wt 132.6 lb

## 2022-02-19 DIAGNOSIS — I480 Paroxysmal atrial fibrillation: Secondary | ICD-10-CM

## 2022-02-19 DIAGNOSIS — Z9229 Personal history of other drug therapy: Secondary | ICD-10-CM

## 2022-02-19 DIAGNOSIS — I5022 Chronic systolic (congestive) heart failure: Secondary | ICD-10-CM

## 2022-02-19 DIAGNOSIS — I10 Essential (primary) hypertension: Secondary | ICD-10-CM | POA: Diagnosis not present

## 2022-02-19 DIAGNOSIS — E785 Hyperlipidemia, unspecified: Secondary | ICD-10-CM

## 2022-02-19 DIAGNOSIS — Z79899 Other long term (current) drug therapy: Secondary | ICD-10-CM | POA: Diagnosis not present

## 2022-02-19 LAB — CBC
Hematocrit: 39.7 % (ref 34.0–46.6)
Hemoglobin: 13.3 g/dL (ref 11.1–15.9)
MCH: 31.7 pg (ref 26.6–33.0)
MCHC: 33.5 g/dL (ref 31.5–35.7)
MCV: 95 fL (ref 79–97)
Platelets: 245 10*3/uL (ref 150–450)
RBC: 4.2 x10E6/uL (ref 3.77–5.28)
RDW: 12.7 % (ref 11.7–15.4)
WBC: 12.4 10*3/uL — ABNORMAL HIGH (ref 3.4–10.8)

## 2022-02-19 LAB — COMPREHENSIVE METABOLIC PANEL
ALT: 31 IU/L (ref 0–32)
AST: 29 IU/L (ref 0–40)
Albumin/Globulin Ratio: 1.9 (ref 1.2–2.2)
Albumin: 4.8 g/dL — ABNORMAL HIGH (ref 3.6–4.6)
Alkaline Phosphatase: 81 IU/L (ref 44–121)
BUN/Creatinine Ratio: 30 — ABNORMAL HIGH (ref 12–28)
BUN: 27 mg/dL (ref 8–27)
Bilirubin Total: 0.4 mg/dL (ref 0.0–1.2)
CO2: 24 mmol/L (ref 20–29)
Calcium: 9.7 mg/dL (ref 8.7–10.3)
Chloride: 96 mmol/L (ref 96–106)
Creatinine, Ser: 0.9 mg/dL (ref 0.57–1.00)
Globulin, Total: 2.5 g/dL (ref 1.5–4.5)
Glucose: 86 mg/dL (ref 70–99)
Potassium: 4.6 mmol/L (ref 3.5–5.2)
Sodium: 134 mmol/L (ref 134–144)
Total Protein: 7.3 g/dL (ref 6.0–8.5)
eGFR: 62 mL/min/{1.73_m2} (ref >59)

## 2022-02-19 LAB — TSH: TSH: 5.47 u[IU]/mL — ABNORMAL HIGH (ref 0.450–4.500)

## 2022-02-19 MED ORDER — AMIODARONE HCL 100 MG PO TABS: 100.0000 mg | ORAL_TABLET | Freq: Every day | ORAL | 3 refills | Status: DC

## 2022-02-19 MED ORDER — LOSARTAN POTASSIUM 25 MG PO TABS: 25.0000 mg | ORAL_TABLET | Freq: Every day | ORAL | 3 refills | Status: DC

## 2022-02-19 MED ORDER — CARVEDILOL 6.25 MG PO TABS: 6.2500 mg | ORAL_TABLET | Freq: Two times a day (BID) | ORAL | 3 refills | Status: DC

## 2022-02-19 MED ORDER — CARVEDILOL 6.25 MG PO TABS: 6.2500 mg | ORAL_TABLET | Freq: Two times a day (BID) | ORAL | 0 refills | Status: DC

## 2022-02-19 MED ORDER — LOSARTAN POTASSIUM 25 MG PO TABS
25.0000 mg | ORAL_TABLET | Freq: Every day | ORAL | 3 refills | Status: DC
Start: 2022-02-19 — End: 2023-02-17

## 2022-02-19 MED ORDER — SPIRONOLACTONE 25 MG PO TABS: 12.5000 mg | ORAL_TABLET | Freq: Every day | ORAL | 0 refills | Status: DC

## 2022-02-19 MED ORDER — AMIODARONE HCL 100 MG PO TABS: 100.0000 mg | ORAL_TABLET | Freq: Every day | ORAL | 0 refills | Status: DC

## 2022-02-19 MED ORDER — SPIRONOLACTONE 25 MG PO TABS: 12.5000 mg | ORAL_TABLET | Freq: Every day | ORAL | 3 refills | Status: DC

## 2022-02-19 NOTE — Progress Notes (Signed)
?Cardiology Office Note:   ? ?Date:  02/21/2022  ? ?ID:  Amy [name], DOB 11-Mar-1933, MRN [id number] ? ?PCP:  Antony Contras, MD ?  ?[facility] Providers ?Cardiologist:  Evalina Field, MD    ? ?Referring MD: Antony Contras, MD  ? ?Chief Complaint  ?Patient presents with  ? Follow-up  ?  Seen for Dr. Audie Box  ? ? ?History of Present Illness:   ? ?Amy [name] is a 88 y.o. female with a hx of chronic systolic heart failure, A-fib on amiodarone and Xarelto, LBBB, hypertension, hyperlipidemia and a history of rheumatic heart disease.  Patient was diagnosed with heart failure in May 2022.  Echocardiogram at the time revealed EF 20 to 25%.  She opted for medical management.  It was suspected that her low ejection fraction was related to atrial fibrillation she was having in the hospital in the setting of left bundle branch block.  She was not interested in ICD either Entresto was stopped due to low blood pressure.  She was maintained on carvedilol, losartan and Aldactone.  She has not required Lasix at home. ? ?Patient presents today for follow-up along with her son.  She is due for annual blood work.  She is not interested in repeat echocardiogram as she would wish to minimize the amount of cardiac work-up.  Otherwise she denies any chest pain or worsening shortness of breath.  She does complain of fatigue and increased sleeping time.  I recommended blood work include CMP, CBC, TSH to rule out secondary causes for her fatigue.  I will also obtain a annual chest x-ray given the fact that she is on amiodarone.  I have refilled her medication.  We obtained orthostatic vital signs in the office due to dizziness especially with changing the body positions.  She does not have significant orthostatic hypotension.  She does have some degree of dizziness with changing body positions, I asked her to slowly get up in the morning and keep a glass of water by the bedside to drink in the morning prior to getting up.  Unless her  systolic pressure is lower than 100 mmHg, I do not recommend cutting back on carvedilol at this time.  Otherwise, she can follow-up in 6 months.  On physical exam, she does not have any significant lower extremity edema.  EKG today shows she is maintaining sinus rhythm. ? ? ?Orthostatic vital signs ?Supine BP 161/72, heart rate 64 ?Sitting BP 161/64 heart rate 66 ?Standing 0 minutes BP 148/70 heart rate 72 ?Standing 3-minute BP 162/75 heart rate 70 bpm. ? ?Past Medical History:  ?Diagnosis Date  ? Arrhythmia   ? CHF (congestive heart failure) ([place])   ? Hyperlipidemia   ? Hypertension   ? Rheumatic heart disease   ? ? ?Past Surgical History:  ?Procedure Laterality Date  ? APPENDECTOMY    ? ? ?Current Medications: ?Current Meds  ?Medication Sig  ? buPROPion (WELLBUTRIN XL) 150 MG 24 hr tablet Take 150 mg by mouth daily.  ? furosemide (LASIX) 40 MG tablet Take 1 tablet (40 mg total) by mouth daily as needed for edema.  ? hydrOXYzine (ATARAX/VISTARIL) 25 MG tablet Take 12.5-25 mg by mouth every 8 (eight) hours as needed for anxiety.  ? IRON PO Take 1 tablet by mouth daily.  ? potassium chloride (KLOR-CON 10) 10 MEQ tablet Take 2 tablets (20 mEq total) by mouth daily as needed. When you take Lasix  ? rosuvastatin (CRESTOR) 20 MG tablet Take 20 mg by mouth  every morning.  ? XARELTO 15 MG TABS tablet TAKE ONE TABLET BY MOUTH EVERY EVENING  ? [DISCONTINUED] carvedilol (COREG) 6.25 MG tablet TAKE ONE TABLET BY MOUTH TWICE DAILY WITH A MEAL. Needs appointment for further refills  ? [DISCONTINUED] spironolactone (ALDACTONE) 25 MG tablet TAKE 1/2 TABLET BY MOUTH ONCE DAILY Needs appointment for further refills  ?  ? ?Allergies:   Lisinopril and Oxycodone  ? ?Social History  ? ?Socioeconomic History  ? Marital status: Married  ?  Spouse name: Not on file  ? Number of children: Not on file  ? Years of education: Not on file  ? Highest education level: Not on file  ?Occupational History  ? Not on file  ?Tobacco Use  ? Smoking  status: Former  ?  Packs/day: 0.50  ?  Years: 30.00  ?  Pack years: 15.00  ?  Types: Cigarettes  ? Smokeless tobacco: Never  ?Substance and Sexual Activity  ? Alcohol use: No  ? Drug use: No  ? Sexual activity: Not on file  ?Other Topics Concern  ? Not on file  ?Social History Narrative  ? Not on file  ? ?Social Determinants of Health  ? ?Financial Resource Strain: Not on file  ?Food Insecurity: Not on file  ?Transportation Needs: Not on file  ?Physical Activity: Not on file  ?Stress: Not on file  ?Social Connections: Not on file  ?  ? ?Family History: ?The patient's family history includes Stroke in her maternal grandmother. ? ?ROS:   ?Please see the history of present illness.    ? All other systems reviewed and are negative. ? ?EKGs/Labs/Other Studies Reviewed:   ? ?The following studies were reviewed today: ? ?Echo 03/13/2021 ? 1. Limited study Apical images incomplete (apical 2 and 3 chamber not  ?done). Overall, based on previous images LVEF is severely depressed at  ?approximately 20 to 25%. Left ventricular diastolic parameters are  ?indeterminate.  ? 2. Right ventricular systolic function is moderately reduced. The right  ?ventricular size is normal.  ? 3. Left atrial size was mildly dilated.  ? 4. Right atrial size was mildly dilated.  ? 5. The mitral valve is normal in structure. Mild mitral valve  ?regurgitation.  ? 6. The aortic valve is normal in structure. Aortic valve regurgitation is  ?not visualized.  ? ?EKG:  EKG is ordered today.  The ekg ordered today demonstrates NSR, LBBB ? ?Recent Labs: ?03/12/2021: B Natriuretic Peptide 1,128.3 ?03/15/2021: Magnesium 1.9 ?02/19/2022: ALT 31; BUN 27; Creatinine, Ser 0.90; Hemoglobin 13.3; Platelets 245; Potassium 4.6; Sodium 134; TSH 5.470  ?Recent Lipid Panel ?   ?Component Value Date/Time  ? CHOL 143 03/13/2017 1147  ? TRIG 187 (H) 03/13/2017 1147  ? HDL 66 03/13/2017 1147  ? CHOLHDL 2.2 03/13/2017 1147  ? [place] 40 03/13/2017 1147  ? ? ? ?Risk  Assessment/Calculations:   ? ?CHA2DS2-VASc Score = 5  ? This indicates a 7.2% annual risk of stroke. ?The patient's score is based upon: ?CHF History: 1 ?HTN History: 1 ?Diabetes History: 0 ?Stroke History: 0 ?Vascular Disease History: 0 ?Age Score: 2 ?Gender Score: 1 ?  ? ? ?    ? ?Physical Exam:   ? ?VS:  BP (!) 134/56   Pulse 72   Ht 5' (1.524 m)   Wt 132 lb 9.6 oz (60.1 kg)   SpO2 95%   BMI 25.90 kg/m?    ? ?Wt Readings from Last 3 Encounters:  ?02/19/22 132 lb 9.6 oz (60.1  kg)  ?08/27/21 129 lb (58.5 kg)  ?05/03/21 120 lb (54.4 kg)  ?  ? ?GEN:  Well nourished, well developed in no acute distress ?HEENT: Normal ?NECK: No JVD; No carotid bruits ?LYMPHATICS: No lymphadenopathy ?CARDIAC: RRR, no murmurs, rubs, gallops ?RESPIRATORY:  Clear to auscultation without rales, wheezing or rhonchi  ?ABDOMEN: Soft, non-tender, non-distended ?MUSCULOSKELETAL:  No edema; No deformity  ?SKIN: Warm and dry ?NEUROLOGIC:  Alert and oriented x 3 ?PSYCHIATRIC:  Normal affect  ? ?ASSESSMENT:   ? ?1. PAF (paroxysmal atrial fibrillation) ([place])   ?2. Medication management   ?3. History of amiodarone therapy   ?4. Chronic systolic heart failure ([place])   ?5. Primary hypertension   ?6. Hyperlipidemia LDL goal <100   ? ?PLAN:   ? ?In order of problems listed above: ? ?Paroxysmal atrial fibrillation: On Xarelto, amiodarone and carvedilol.  Obtain CBC ? ?History of amiodarone therapy: Obtain CMP, TSH and a chest x-ray ? ?Chronic systolic heart failure: Likely related to atrial fibrillation.  EF 25% on echocardiogram last year.  Patient is not interested in repeat echocardiogram or ICD ? ?Hypertension: Blood pressure stable on current therapy.  She does complain of fatigue, however unless systolic blood pressure is low, I would not recommend cutting back on carvedilol ? ?Hyperlipidemia: On Crestor. ? ?   ? ?   ? ? ?Medication Adjustments/Labs and Tests Ordered: ?Current medicines are reviewed at length with the patient today.  Concerns  regarding medicines are outlined above.  ?Orders Placed This Encounter  ?Procedures  ? DG Chest 2 View  ? CBC  ? Comprehensive metabolic panel  ? TSH  ? EKG 12-Lead  ? ?Meds ordered this encounter  ?Medications  ? DISCONTD: spironola

## 2022-02-19 NOTE — Patient Instructions (Signed)
Medication Instructions:  ?Your physician recommends that you continue on your current medications as directed. Please refer to the Current Medication list given to you today. ? ?*If you need a refill on your cardiac medications before your next appointment, please call your pharmacy* ? ?Lab Work: ?Your physician recommends that you return for lab work TODAY:  ?CBC ?CMP ?TSH ?If you have labs (blood work) drawn today and your tests are completely normal, you will receive your results only by: ?MyChart Message (if you have MyChart) OR ?A paper copy in the mail ?If you have any lab test that is abnormal or we need to change your treatment, we will call you to review the results. ? ?Testing/Procedures: ?A chest x-ray takes a picture of the organs and structures inside the chest, including the heart, lungs, and blood vessels. This test can show several things, including, whether the heart is enlarges; whether fluid is building up in the lungs; and whether pacemaker / defibrillator leads are still in place. THIS TEST IS PERFORMED AT [facility] [address] [place], [place] [address] ? ?Follow-Up: ?At [facility], you and your health needs are our priority.  As part of our continuing mission to provide you with exceptional heart care, we have created designated Provider Care Teams.  These Care Teams include your primary Cardiologist (physician) and Advanced Practice Providers (APPs -  Physician Assistants and Nurse Practitioners) who all work together to provide you with the care you need, when you need it. ?  ?Your next appointment:   ?6 month(s) ? ?The format for your next appointment:   ?In Person ? ?Provider:   ?Evalina Field, MD   ? ? ? ?Other Instructions ?CONTINUE to monitor your blood pressure at home. If your systolic (top number) of blood pressure is less than 100 please give our office a call.  ? ?Important Information About Sugar ? ? ? ? ? ? ?

## 2022-02-21 ENCOUNTER — Encounter: Payer: Self-pay | Admitting: Physician Assistant

## 2022-02-21 NOTE — Progress Notes (Signed)
Stable renal function and electrolyte. Her thyroid level is borderline abnormal. Recommend follow up with PCP for evaluation of abnormal TSH. White blood cell count also mildly high, need to observe for any sign of infection such as fever, chill or cough, if there are any sign of infection, she will need to see PCP as well. Her son manages her care.

## 2022-02-24 DIAGNOSIS — I5032 Chronic diastolic (congestive) heart failure: Secondary | ICD-10-CM | POA: Diagnosis not present

## 2022-03-26 DIAGNOSIS — E7800 Pure hypercholesterolemia, unspecified: Secondary | ICD-10-CM | POA: Diagnosis not present

## 2022-03-26 DIAGNOSIS — I5032 Chronic diastolic (congestive) heart failure: Secondary | ICD-10-CM | POA: Diagnosis not present

## 2022-03-26 DIAGNOSIS — I480 Paroxysmal atrial fibrillation: Secondary | ICD-10-CM | POA: Diagnosis not present

## 2022-03-26 DIAGNOSIS — I10 Essential (primary) hypertension: Secondary | ICD-10-CM | POA: Diagnosis not present

## 2022-03-26 DIAGNOSIS — I5022 Chronic systolic (congestive) heart failure: Secondary | ICD-10-CM | POA: Diagnosis not present

## 2022-04-03 ENCOUNTER — Other Ambulatory Visit: Payer: Self-pay

## 2022-04-03 ENCOUNTER — Observation Stay (HOSPITAL_COMMUNITY): Payer: Medicare Other

## 2022-04-03 ENCOUNTER — Encounter (HOSPITAL_COMMUNITY): Payer: Self-pay

## 2022-04-03 ENCOUNTER — Observation Stay (HOSPITAL_COMMUNITY)
Admission: EM | Admit: 2022-04-03 | Discharge: 2022-04-06 | Disposition: A | Discharge status: Home / Self Care | Payer: Medicare Other | Attending: Internal Medicine | Admitting: Internal Medicine

## 2022-04-03 DIAGNOSIS — I480 Paroxysmal atrial fibrillation: Secondary | ICD-10-CM | POA: Diagnosis present

## 2022-04-03 DIAGNOSIS — K5730 Diverticulosis of large intestine without perforation or abscess without bleeding: Secondary | ICD-10-CM | POA: Diagnosis not present

## 2022-04-03 DIAGNOSIS — E785 Hyperlipidemia, unspecified: Secondary | ICD-10-CM | POA: Diagnosis present

## 2022-04-03 DIAGNOSIS — N281 Cyst of kidney, acquired: Secondary | ICD-10-CM | POA: Diagnosis not present

## 2022-04-03 DIAGNOSIS — Z79899 Other long term (current) drug therapy: Secondary | ICD-10-CM | POA: Insufficient documentation

## 2022-04-03 DIAGNOSIS — K625 Hemorrhage of anus and rectum: Secondary | ICD-10-CM | POA: Diagnosis present

## 2022-04-03 DIAGNOSIS — Z87891 Personal history of nicotine dependence: Secondary | ICD-10-CM | POA: Diagnosis not present

## 2022-04-03 DIAGNOSIS — K449 Diaphragmatic hernia without obstruction or gangrene: Secondary | ICD-10-CM | POA: Diagnosis not present

## 2022-04-03 DIAGNOSIS — Z7901 Long term (current) use of anticoagulants: Secondary | ICD-10-CM

## 2022-04-03 DIAGNOSIS — Z66 Do not resuscitate: Secondary | ICD-10-CM | POA: Diagnosis not present

## 2022-04-03 DIAGNOSIS — I10 Essential (primary) hypertension: Secondary | ICD-10-CM | POA: Diagnosis not present

## 2022-04-03 DIAGNOSIS — K922 Gastrointestinal hemorrhage, unspecified: Principal | ICD-10-CM | POA: Diagnosis present

## 2022-04-03 DIAGNOSIS — K4090 Unilateral inguinal hernia, without obstruction or gangrene, not specified as recurrent: Secondary | ICD-10-CM | POA: Diagnosis not present

## 2022-04-03 DIAGNOSIS — I5022 Chronic systolic (congestive) heart failure: Secondary | ICD-10-CM | POA: Diagnosis present

## 2022-04-03 DIAGNOSIS — I110 Hypertensive heart disease with heart failure: Secondary | ICD-10-CM | POA: Insufficient documentation

## 2022-04-03 DIAGNOSIS — K921 Melena: Secondary | ICD-10-CM | POA: Insufficient documentation

## 2022-04-03 LAB — URINALYSIS, ROUTINE W REFLEX MICROSCOPIC
Bilirubin Urine: NEGATIVE
Glucose, UA: NEGATIVE mg/dL
Ketones, ur: NEGATIVE mg/dL
Nitrite: NEGATIVE
Protein, ur: NEGATIVE mg/dL
Specific Gravity, Urine: 1.014 (ref 1.005–1.030)
pH: 6 (ref 5.0–8.0)

## 2022-04-03 LAB — CBC WITH DIFFERENTIAL/PLATELET
Abs Immature Granulocytes: 0.03 10*3/uL (ref 0.00–0.07)
Basophils Absolute: 0 10*3/uL (ref 0.0–0.1)
Basophils Relative: 1 %
Eosinophils Absolute: 0.1 10*3/uL (ref 0.0–0.5)
Eosinophils Relative: 1 %
HCT: 39.4 % (ref 36.0–46.0)
Hemoglobin: 13 g/dL (ref 12.0–15.0)
Immature Granulocytes: 0 %
Lymphocytes Relative: 17 %
Lymphs Abs: 1.2 10*3/uL (ref 0.7–4.0)
MCH: 31.9 pg (ref 26.0–34.0)
MCHC: 33 g/dL (ref 30.0–36.0)
MCV: 96.8 fL (ref 80.0–100.0)
Monocytes Absolute: 0.8 10*3/uL (ref 0.1–1.0)
Monocytes Relative: 11 %
Neutro Abs: 5.1 10*3/uL (ref 1.7–7.7)
Neutrophils Relative %: 70 %
Platelets: 237 10*3/uL (ref 150–400)
RBC: 4.07 MIL/uL (ref 3.87–5.11)
RDW: 12.8 % (ref 11.5–15.5)
WBC: 7.2 10*3/uL (ref 4.0–10.5)
nRBC: 0 % (ref 0.0–0.2)

## 2022-04-03 LAB — TYPE AND SCREEN
ABO/RH(D): O POS
Antibody Screen: NEGATIVE

## 2022-04-03 LAB — COMPREHENSIVE METABOLIC PANEL
ALT: 24 U/L (ref 0–44)
AST: 27 U/L (ref 15–41)
Albumin: 4 g/dL (ref 3.5–5.0)
Alkaline Phosphatase: 58 U/L (ref 38–126)
Anion gap: 8 (ref 5–15)
BUN: 23 mg/dL (ref 8–23)
CO2: 24 mmol/L (ref 22–32)
Calcium: 9 mg/dL (ref 8.9–10.3)
Chloride: 104 mmol/L (ref 98–111)
Creatinine, Ser: 0.73 mg/dL (ref 0.44–1.00)
GFR, Estimated: >60 mL/min (ref >60)
Glucose, Bld: 112 mg/dL — ABNORMAL HIGH (ref 70–99)
Potassium: 4.6 mmol/L (ref 3.5–5.1)
Sodium: 136 mmol/L (ref 135–145)
Total Bilirubin: 0.5 mg/dL (ref 0.3–1.2)
Total Protein: 6.9 g/dL (ref 6.5–8.1)

## 2022-04-03 LAB — LIPASE, BLOOD: Lipase: 42 U/L (ref 11–51)

## 2022-04-03 MED ORDER — LOSARTAN POTASSIUM 25 MG PO TABS
25.0000 mg | ORAL_TABLET | Freq: Every day | ORAL | Status: DC
  Administered 2022-04-04 – 2022-04-06 (×3): 25 mg via ORAL
  Filled 2022-04-03 (×3): qty 1

## 2022-04-03 MED ORDER — SPIRONOLACTONE 12.5 MG HALF TABLET
12.5000 mg | ORAL_TABLET | Freq: Every day | ORAL | Status: DC
  Administered 2022-04-04 – 2022-04-06 (×3): 12.5 mg via ORAL
  Filled 2022-04-03 (×5): qty 1

## 2022-04-03 MED ORDER — ONDANSETRON HCL 4 MG/2ML IJ SOLN: 4.0000 mg | Freq: Four times a day (QID) | INTRAMUSCULAR | Status: DC | PRN

## 2022-04-03 MED ORDER — AMIODARONE HCL 100 MG PO TABS
100.0000 mg | ORAL_TABLET | Freq: Every day | ORAL | Status: DC
  Administered 2022-04-04 – 2022-04-06 (×3): 100 mg via ORAL
  Filled 2022-04-03 (×5): qty 1

## 2022-04-03 MED ORDER — ACETAMINOPHEN 650 MG RE SUPP: 650.0000 mg | Freq: Four times a day (QID) | RECTAL | Status: DC | PRN

## 2022-04-03 MED ORDER — CARVEDILOL 6.25 MG PO TABS
6.2500 mg | ORAL_TABLET | Freq: Two times a day (BID) | ORAL | Status: DC
  Administered 2022-04-04 – 2022-04-06 (×5): 6.25 mg via ORAL
  Filled 2022-04-03 (×5): qty 1

## 2022-04-03 MED ORDER — ONDANSETRON HCL 4 MG PO TABS
4.0000 mg | ORAL_TABLET | Freq: Four times a day (QID) | ORAL | Status: DC | PRN
  Administered 2022-04-05: 4 mg via ORAL
  Filled 2022-04-03: qty 1

## 2022-04-03 MED ORDER — IOHEXOL 350 MG/ML SOLN
100.0000 mL | Freq: Once | INTRAVENOUS | Status: AC | PRN
Start: 2022-04-03 — End: 2022-04-03
  Administered 2022-04-03: 100 mL via INTRAVENOUS

## 2022-04-03 MED ORDER — ROSUVASTATIN CALCIUM 20 MG PO TABS
20.0000 mg | ORAL_TABLET | Freq: Every morning | ORAL | Status: DC
  Administered 2022-04-04 – 2022-04-06 (×3): 20 mg via ORAL
  Filled 2022-04-03 (×3): qty 1

## 2022-04-03 MED ORDER — ACETAMINOPHEN 325 MG PO TABS: 650.0000 mg | ORAL_TABLET | Freq: Four times a day (QID) | ORAL | Status: DC | PRN

## 2022-04-03 NOTE — ED Triage Notes (Signed)
Pt reports going to the bathroom earlier today and noticed bright red blood. Pt is unsure if blood came from vagina, urethra, or rectum. Pt denies abdominal pain prior, but now endorses some abdominal discomfort.

## 2022-04-03 NOTE — Assessment & Plan Note (Addendum)
Stable. Continue Crestor 20mg daily

## 2022-04-03 NOTE — ED Provider Triage Note (Signed)
Emergency Medicine Provider Triage Evaluation Note  Amy [name] , a 88 y.o. female  was evaluated in triage.  Pt complains of abdominal pain and bleeding. She states that this morning when she woke up she went to the bathroom and noticed a large amount of blood in the toilet. She is unsure if the blood came from her vagina, urethra, or her rectum. She states that she had a second episode of the same soon after that. She states that it felt like it might've been diarrhea, but she isnt sure. She denies any history of GI bleed in the past. Also endorses lower abdominal pain that started this morning as well.  Denies nausea or vomiting.  Review of Systems  Positive:  Negative: See above  Physical Exam  BP 138/79   Pulse 77   Temp 97.8 F (36.6 C) (Oral)   Resp 16   Ht 5' (1.524 m)   Wt 59.4 kg   SpO2 97%   BMI 25.58 kg/m  Gen:   Awake, no distress   Resp:  Normal effort  MSK:   Moves extremities without difficulty  Other:  Mild generalized abdominal tenderness  Medical Decision Making  Medically screening exam initiated at 2:14 PM.  Appropriate orders placed.  Amy [name] was informed that the remainder of the evaluation will be completed by another provider, this initial triage assessment does not replace that evaluation, and the importance of remaining in the ED until their evaluation is complete.     Bud Face, PA-C 04/03/22 1418

## 2022-04-03 NOTE — Assessment & Plan Note (Signed)
Stable.  Euvolemic.  Continue GDMT

## 2022-04-03 NOTE — ED Notes (Signed)
Sent SBAR to the floor. Carma Leaven rn

## 2022-04-03 NOTE — Assessment & Plan Note (Signed)
Verified with patient. She is competent to make her own medical decisions in my medical opinion.

## 2022-04-03 NOTE — Assessment & Plan Note (Signed)
Admit to observation medical bed.  CT mesenteric angio ordered.  Check H&H every 6 hours.  Discussed with the patient potential for needing intervention either via colonoscopy or interventional radiology.  Type and screen ordered.

## 2022-04-03 NOTE — Assessment & Plan Note (Addendum)
Stable.  Continue Coreg 6.25 mg, Cozaar 25 mg, Aldactone 12.5 mg.  Hold for systolic blood pressure less than 120 or heart rate less than 60.

## 2022-04-03 NOTE — ED Provider Notes (Signed)
[facility] DEPT Provider Note   CSN: [id number] Arrival date & time: 04/03/22  1344     History  Chief Complaint  Patient presents with   Hematuria   Rectal Bleeding    Amy [name] is a 88 y.o. female.   Hematuria Associated symptoms include abdominal pain.  Rectal Bleeding Associated symptoms: abdominal pain    88 year old female with medical history significant for CHF, HTN, HLD, presenting to the emergency department with generalized abdominal discomfort and bleeding.  The patient woke up this morning with a significant amount of blood in her groin area.  She is unsure where it was coming from.  She had a bowel movement this morning where she noted blood mixed in with her stool.  She is unable to fully characterize the amount of blood but feels like it was at least more than a tablespoon possibly less than a cupful. No nausea or vomiting. No fevers or chills.  She is not currently on anticoagulation.  She denies any syncope.  She denies any dysuria or increased urinary frequency.  She also feels like she is having hematuria and passing blood in her urine.  Home Medications Prior to Admission medications   Medication Sig Start Date End Date Taking? Authorizing Provider  amiodarone (PACERONE) 100 MG tablet Take 1 tablet (100 mg total) by mouth daily. 02/19/22   Geralynn Rile, MD  buPROPion (WELLBUTRIN XL) 150 MG 24 hr tablet Take 150 mg by mouth daily. 04/07/18   [provider]  carvedilol (COREG) 6.25 MG tablet Take 1 tablet (6.25 mg total) by mouth 2 (two) times daily with a meal. 02/19/22   O'Neal, Cassie Freer, MD  furosemide (LASIX) 40 MG tablet Take 1 tablet (40 mg total) by mouth daily as needed for edema. 05/03/21 02/19/22  O'NealCassie Freer, MD  hydrOXYzine (ATARAX/VISTARIL) 25 MG tablet Take 12.5-25 mg by mouth every 8 (eight) hours as needed for anxiety.    [provider]  IRON PO Take 1 tablet by mouth daily.     [provider]  losartan (COZAAR) 25 MG tablet Take 1 tablet (25 mg total) by mouth daily. 02/19/22   O'Neal, Cassie Freer, MD  potassium chloride (KLOR-CON 10) 10 MEQ tablet Take 2 tablets (20 mEq total) by mouth daily as needed. When you take Lasix 05/03/21 02/19/22  O'Neal, Cassie Freer, MD  rosuvastatin (CRESTOR) 20 MG tablet Take 20 mg by mouth every morning. 02/21/21   [provider]  spironolactone (ALDACTONE) 25 MG tablet Take 0.5 tablets (12.5 mg total) by mouth daily. 02/19/22   O'Neal, Cassie Freer, MD  XARELTO 15 MG TABS tablet TAKE ONE TABLET BY MOUTH EVERY EVENING 11/26/21   O'Neal, Cassie Freer, MD      Allergies    Lisinopril and Oxycodone    Review of Systems   Review of Systems  Gastrointestinal:  Positive for abdominal pain, anal bleeding, blood in stool and hematochezia.  Genitourinary:  Positive for hematuria.  All other systems reviewed and are negative.  Physical Exam Updated Vital Signs BP (!) 168/68   Pulse 69   Temp 97.8 F (36.6 C) (Oral)   Resp 18   Ht 5' (1.524 m)   Wt 59.4 kg   SpO2 99%   BMI 25.58 kg/m  Physical Exam Vitals and nursing note reviewed. Exam conducted with a chaperone present.  Constitutional:      General: She is not in acute distress.    Appearance: She is well-developed.  HENT:     Head: Normocephalic and atraumatic.  Eyes:     Conjunctiva/sclera: Conjunctivae normal.  Cardiovascular:     Rate and Rhythm: Normal rate and regular rhythm.  Pulmonary:     Effort: Pulmonary effort is normal. No respiratory distress.     Breath sounds: Normal breath sounds.  Abdominal:     Palpations: Abdomen is soft.     Tenderness: There is no abdominal tenderness.  Genitourinary:    Comments: Rectal exam performed with chaperone, positive for BRBPR, no melena, no hemorrhoids or obvious anal fissure Musculoskeletal:        General: No swelling.     Cervical back: Neck supple.  Skin:    General: Skin is warm and dry.      Capillary Refill: Capillary refill takes less than 2 seconds.  Neurological:     Mental Status: She is alert.  Psychiatric:        Mood and Affect: Mood normal.    ED Results / Procedures / Treatments   Labs (all labs ordered are listed, but only abnormal results are displayed) Labs Reviewed  COMPREHENSIVE METABOLIC PANEL - Abnormal; Notable for the following components:      Result Value   Glucose, Bld 112 (*)    All other components within normal limits  URINALYSIS, ROUTINE W REFLEX MICROSCOPIC - Abnormal; Notable for the following components:   APPearance HAZY (*)    Hgb urine dipstick LARGE (*)    Leukocytes,Ua SMALL (*)    Bacteria, UA FEW (*)    All other components within normal limits  LIPASE, BLOOD  CBC WITH DIFFERENTIAL/PLATELET  TYPE AND SCREEN    EKG None  Radiology CT ABDOMEN PELVIS W CONTRAST  Result Date: 04/03/2022 [facility] [place]     04/03/2022  4:02 PM Delay for CT due to pt not having IV charted ER PA notified    Procedures Procedures    Medications Ordered in ED Medications - No data to display  ED Course/ Medical Decision Making/ A&P                           Medical Decision Making Amount and/or Complexity of Data Reviewed Labs: ordered. Radiology: ordered.  Risk Decision regarding hospitalization.   88 year old female with medical history significant for CHF, HTN, HLD, presenting to the emergency department with generalized abdominal discomfort and bleeding.  The patient woke up this morning with a significant amount of blood in her groin area.  She is unsure where it was coming from.  She had a bowel movement this morning where she noted blood mixed in with her stool.  She is unable to fully characterize the amount of blood but feels like it was at least more than a tablespoon possibly less than a cupful. No nausea or vomiting. No fevers or chills.  She is not currently on anticoagulation.  She denies any syncope.  She denies  any dysuria or increased urinary frequency.  She also feels like she is having hematuria and passing blood in her urine.  On arrival, the patient was vitally stable, NSR noted on cardiac telemetry.  Physical exam significant for bright red blood per rectum, no melena, mild rectal tenderness to palpation with no obvious hemorrhoid or anal fissure.  Differential diagnosis includes most likely lower GI bleed, consider diverticular bleed.  Unclear etiology of the patient's hematuria although bleeding appears to have spread from the patient's rectum up to  her vagina.  Urinalysis revealed no evidence of UTI, hemoglobin present, unclear if urine sample is contaminated by rectal bleeding.  Patient had minimal to no tenderness to palpation on exam.  She is tolerating oral intake.  She has no nausea or vomiting, no fevers or chills.  Initial work-up initiated in triage significant for lipase 42, CMP unremarkable, urinalysis with large hemoglobin, small leukocytes, few bacteria, CBC without leukocytosis, hemoglobin stable at 13. She denies any urinary complaints.   The patient has a Glascow Blatchford bleeding score 5.  Due to this and concern for active GI bleed, recommended admission for observation and trending the patient's hemoglobin. CT Angio GI Bleed protocol ordered. Patient remained hemodynamically stable prior to admission.  Hospitalist medicine consulted for admission, Dr. Bridgett Larsson accepting.  Final Clinical Impression(s) / ED Diagnoses Final diagnoses:  Hematochezia  Lower GI bleed    Rx / DC Orders ED Discharge Orders     None         Regan Lemming, MD 04/03/22 1947

## 2022-04-03 NOTE — Assessment & Plan Note (Signed)
Stable.  Continue amiodarone 100 mg daily.  Hold Xarelto due to GI bleeding.

## 2022-04-03 NOTE — Subjective & Objective (Signed)
Chief complaint: Rectal bleeding History of present illness: 88 year old female history of chronic systolic heart failure LVEF of 20 to 25%, paroxysmal atrial fibrillation, chronic anticoagulation, hypertension, hyperlipidemia presents to the ER today with a 1 day history of bright red blood per rectum.  Patient states that she does not have a history of GI bleeding.  Does not have a history of hemorrhoids.  She states that she was having a bowel movement today in the toilet.  She looked down and saw her toilet filled with blood.  She also had stool mixed in with the blood.  She came to the ER for evaluation.  Patient denies any chest pain, lightheadedness,  Abdominal pain.  Patient is on Xarelto for anticoagulation for A-fib.   On arrival to the ER, temp 97.8 heart rate 77 blood pressure 138/79 satting 97% on room air.  EDP performed rectal examination which showed hematochezia.  Laboratory evaluation: BUN of 23, creatinine 0.7  Hemoglobin 13, hematocrit 39.4 platelets 237  CT mesenteric angio pending  [facility] hospitalist contacted for admission.

## 2022-04-03 NOTE — Procedures (Deleted)
Delay for CT due to pt not having IV charted  ER PA notified

## 2022-04-03 NOTE — Assessment & Plan Note (Signed)
Chronic.  Holding Xarelto for GI bleeding.  Will need to see cardiologist in outpatient follow-up to restart Xarelto.  Patient states that she gets her medications prepackaged from the pharmacy.  She will need assistance in identifying which is her Xarelto tablet so she can remove this from the packet each day.  May need to list the help of the pharmacist for pill identification.

## 2022-04-03 NOTE — ED Notes (Signed)
Chaperoned Dr Armandina Gemma with a peritoneum and rectal exam. Bright red blood in rectum

## 2022-04-03 NOTE — H&P (Signed)
History and Physical    Amy [name] ZOX:[id number] DOB: 27-Jan-1933 DOA: 04/03/2022  DOS: the patient was seen and examined on 04/03/2022  PCP: Antony Contras, MD   Patient coming from: Home  I have personally briefly reviewed patient's old medical records in [place]  Chief complaint: Rectal bleeding History of present illness: 88 year old female history of chronic systolic heart failure LVEF of 20 to 25%, paroxysmal atrial fibrillation, chronic anticoagulation, hypertension, hyperlipidemia presents to the ER today with a 1 day history of bright red blood per rectum.  Patient states that she does not have a history of GI bleeding.  Does not have a history of hemorrhoids.  She states that she was having a bowel movement today in the toilet.  She looked down and saw her toilet filled with blood.  She also had stool mixed in with the blood.  She came to the ER for evaluation.  Patient denies any chest pain, lightheadedness,  Abdominal pain.  Patient is on Xarelto for anticoagulation for A-fib.   On arrival to the ER, temp 97.8 heart rate 77 blood pressure 138/79 satting 97% on room air.  EDP performed rectal examination which showed hematochezia.  Laboratory evaluation: BUN of 23, creatinine 0.7  Hemoglobin 13, hematocrit 39.4 platelets 237  CT mesenteric angio pending  [facility] hospitalist contacted for admission.   ED Course: EDP rectal examination showed hematochezia.  Hemoglobin stable.  Review of Systems:  Review of Systems  Constitutional: Negative.  Negative for chills, fever and weight loss.  HENT: Negative.    Eyes: Negative.   Respiratory:  Negative for cough, hemoptysis and sputum production.   Cardiovascular: Negative.  Negative for chest pain, palpitations and orthopnea.  Gastrointestinal:  Negative for abdominal pain, heartburn, nausea and vomiting.       Positive for painless hematochezia  Genitourinary: Negative.  Negative for dysuria and urgency.   Musculoskeletal: Negative.  Negative for back pain, myalgias and neck pain.  Skin: Negative.   Neurological: Negative.  Negative for dizziness.  Endo/Heme/Allergies: Negative.  Does not bruise/bleed easily.  Psychiatric/Behavioral: Negative.    All other systems reviewed and are negative.  Past Medical History:  Diagnosis Date   Acute CHF (congestive heart failure) ([place]) 03/12/2021   Arrhythmia    CHF (congestive heart failure) ([place])    Hyperlipidemia    Hypertension    Rheumatic heart disease     Past Surgical History:  Procedure Laterality Date   APPENDECTOMY       reports that she has quit smoking. Her smoking use included cigarettes. She has a 15.00 pack-year smoking history. She has never used smokeless tobacco. She reports that she does not drink alcohol and does not use drugs.  Allergies  Allergen Reactions   Lisinopril     Other reaction(s): cough   Oxycodone Nausea And Vomiting and Other (See Comments)    Intolerance--"its like I was floating up on the ceiling"     Family History  Problem Relation Age of Onset   Stroke Maternal Grandmother     Prior to Admission medications   Medication Sig Start Date End Date Taking? Authorizing Provider  amiodarone (PACERONE) 100 MG tablet Take 1 tablet (100 mg total) by mouth daily. 02/19/22   Geralynn Rile, MD  buPROPion (WELLBUTRIN XL) 150 MG 24 hr tablet Take 150 mg by mouth daily. 04/07/18   [provider]  carvedilol (COREG) 6.25 MG tablet Take 1 tablet (6.25 mg total) by mouth 2 (two) times daily with  a meal. 02/19/22   O'Neal, Cassie Freer, MD  furosemide (LASIX) 40 MG tablet Take 1 tablet (40 mg total) by mouth daily as needed for edema. 05/03/21 02/19/22  O'NealCassie Freer, MD  hydrOXYzine (ATARAX/VISTARIL) 25 MG tablet Take 12.5-25 mg by mouth every 8 (eight) hours as needed for anxiety.    [provider]  IRON PO Take 1 tablet by mouth daily.    [provider]  losartan (COZAAR)  25 MG tablet Take 1 tablet (25 mg total) by mouth daily. 02/19/22   O'Neal, Cassie Freer, MD  potassium chloride (KLOR-CON 10) 10 MEQ tablet Take 2 tablets (20 mEq total) by mouth daily as needed. When you take Lasix 05/03/21 02/19/22  O'Neal, Cassie Freer, MD  rosuvastatin (CRESTOR) 20 MG tablet Take 20 mg by mouth every morning. 02/21/21   [provider]  spironolactone (ALDACTONE) 25 MG tablet Take 0.5 tablets (12.5 mg total) by mouth daily. 02/19/22   O'Neal, Cassie Freer, MD  XARELTO 15 MG TABS tablet TAKE ONE TABLET BY MOUTH EVERY EVENING 11/26/21   O'Neal, Cassie Freer, MD    Physical Exam: Vitals:   04/03/22 1830 04/03/22 1930 04/03/22 2000 04/03/22 2019  BP: (!) 172/80 (!) 168/68 140/90 140/90  Pulse: 67 69 73 73  Resp: '16 18  18  '$ Temp:      TempSrc:      SpO2: 97% 99% 98% 98%  Weight:      Height:        Physical Exam Vitals and nursing note reviewed.  Constitutional:      General: She is not in acute distress.    Appearance: Normal appearance. She is normal weight. She is not ill-appearing, toxic-appearing or diaphoretic.  HENT:     Head: Normocephalic and atraumatic.     Nose: Nose normal. No rhinorrhea.  Eyes:     General: No scleral icterus. Cardiovascular:     Rate and Rhythm: Normal rate and regular rhythm.     Pulses: Normal pulses.  Pulmonary:     Effort: Pulmonary effort is normal.     Breath sounds: Normal breath sounds.  Abdominal:     General: Abdomen is flat. Bowel sounds are normal. There is no distension.     Palpations: Abdomen is soft.     Tenderness: There is no abdominal tenderness. There is no guarding or rebound.  Musculoskeletal:     Right lower leg: No edema.     Left lower leg: No edema.  Skin:    General: Skin is warm and dry.     Capillary Refill: Capillary refill takes less than 2 seconds.  Neurological:     General: No focal deficit present.     Mental Status: She is alert and oriented to person, place, and time.     Labs  on Admission: I have personally reviewed following labs and imaging studies  CBC: Recent Labs  Lab 04/03/22 1422  WBC 7.2  NEUTROABS 5.1  HGB 13.0  HCT 39.4  MCV 96.8  PLT 888   Basic Metabolic Panel: Recent Labs  Lab 04/03/22 1422  NA 136  K 4.6  CL 104  CO2 24  GLUCOSE 112*  BUN 23  CREATININE 0.73  CALCIUM 9.0   GFR: Estimated Creatinine Clearance: 40 mL/min (by C-G formula based on SCr of 0.73 mg/dL). Liver Function Tests: Recent Labs  Lab 04/03/22 1422  AST 27  ALT 24  ALKPHOS 58  BILITOT 0.5  PROT 6.9  ALBUMIN 4.0  Recent Labs  Lab 04/03/22 1422  LIPASE 42   No results for input(s): AMMONIA in the last 168 hours. Coagulation Profile: No results for input(s): INR, PROTIME in the last 168 hours. Cardiac Enzymes: No results for input(s): CKTOTAL, CKMB, CKMBINDEX, TROPONINI, TROPONINIHS in the last 168 hours. BNP (last 3 results) No results for input(s): PROBNP in the last 8760 hours. HbA1C: No results for input(s): HGBA1C in the last 72 hours. CBG: No results for input(s): GLUCAP in the last 168 hours. Lipid Profile: No results for input(s): CHOL, HDL, LDLCALC, TRIG, CHOLHDL, LDLDIRECT in the last 72 hours. Thyroid Function Tests: No results for input(s): TSH, T4TOTAL, FREET4, T3FREE, THYROIDAB in the last 72 hours. Anemia Panel: No results for input(s): VITAMINB12, FOLATE, FERRITIN, TIBC, IRON, RETICCTPCT in the last 72 hours. Urine analysis:    Component Value Date/Time   COLORURINE YELLOW 04/03/2022 1805   APPEARANCEUR HAZY (A) 04/03/2022 1805   LABSPEC 1.014 04/03/2022 1805   PHURINE 6.0 04/03/2022 1805   GLUCOSEU NEGATIVE 04/03/2022 1805   HGBUR LARGE (A) 04/03/2022 1805   BILIRUBINUR NEGATIVE 04/03/2022 1805   BILIRUBINUR negative 10/24/2016 1106   [place] 04/03/2022 1805   PROTEINUR NEGATIVE 04/03/2022 1805   UROBILINOGEN 1.0 10/24/2016 1106   NITRITE NEGATIVE 04/03/2022 1805   LEUKOCYTESUR SMALL (A) 04/03/2022 1805     Radiological Exams on Admission: I have personally reviewed images CT ABDOMEN PELVIS W CONTRAST  Result Date: 04/03/2022 Gabriel Earing [place], [place]     04/03/2022  4:02 PM Delay for CT due to pt not having IV charted ER PA notified    EKG: My personal interpretation of EKG shows: no ekg  Assessment/Plan Principal Problem:   Acute lower GI bleeding Active Problems:   Benign essential HTN   Hyperlipidemia   Paroxysmal atrial fibrillation (HCC)   Chronic systolic CHF (congestive heart failure) (HCC) - LVEF 20-25%   Chronic anticoagulation - on Xarelto for afib   DNR (do not resuscitate)/DNI(Do Not Intubate)    Assessment and Plan: * Acute lower GI bleeding Admit to observation medical bed.  CT mesenteric angio ordered.  Check H&H every 6 hours.  Discussed with the patient potential for needing intervention either via colonoscopy or interventional radiology.  Type and screen ordered.  DNR (do not resuscitate)/DNI(Do Not Intubate) Verified with patient. She is competent to make her own medical decisions in my medical opinion.  Chronic anticoagulation - on Xarelto for afib Chronic.  Holding Xarelto for GI bleeding.  Will need to see cardiologist in outpatient follow-up to restart Xarelto.  Patient states that she gets her medications prepackaged from the pharmacy.  She will need assistance in identifying which is her Xarelto tablet so she can remove this from the packet each day.  May need to list the help of the pharmacist for pill identification.  Chronic systolic CHF (congestive heart failure) (HCC) - LVEF 20-25% Stable.  Euvolemic.  Continue GDMT  Paroxysmal atrial fibrillation (HCC) Stable.  Continue amiodarone 100 mg daily.  Hold Xarelto due to GI bleeding.  Hyperlipidemia Stable.  Continue Crestor 20 mg daily.  Benign essential HTN Stable.  Continue Coreg 6.25 mg, Cozaar 25 mg, Aldactone 12.5 mg.  Hold for systolic blood pressure less than 120 or heart rate less than  60.   DVT prophylaxis: SCDs Code Status: DNR/DNI(Do NOT Intubate) verified with patient Family Communication: no family at bedside  Disposition Plan: return home. She lives in a home by herself. Her son derek helps with driving her  to grocery store.  Consults called: none  Admission status: Observation, Med-Surg   Kristopher Oppenheim, DO [facility] 04/03/2022, 8:35 PM

## 2022-04-04 ENCOUNTER — Encounter (HOSPITAL_COMMUNITY): Payer: Self-pay | Admitting: Internal Medicine

## 2022-04-04 ENCOUNTER — Other Ambulatory Visit: Payer: Self-pay

## 2022-04-04 DIAGNOSIS — I10 Essential (primary) hypertension: Secondary | ICD-10-CM | POA: Diagnosis not present

## 2022-04-04 DIAGNOSIS — I5022 Chronic systolic (congestive) heart failure: Secondary | ICD-10-CM | POA: Diagnosis not present

## 2022-04-04 DIAGNOSIS — Z66 Do not resuscitate: Secondary | ICD-10-CM | POA: Diagnosis not present

## 2022-04-04 DIAGNOSIS — I480 Paroxysmal atrial fibrillation: Secondary | ICD-10-CM | POA: Diagnosis not present

## 2022-04-04 DIAGNOSIS — E7800 Pure hypercholesterolemia, unspecified: Secondary | ICD-10-CM

## 2022-04-04 DIAGNOSIS — K922 Gastrointestinal hemorrhage, unspecified: Secondary | ICD-10-CM | POA: Diagnosis not present

## 2022-04-04 DIAGNOSIS — Z7901 Long term (current) use of anticoagulants: Secondary | ICD-10-CM | POA: Diagnosis not present

## 2022-04-04 LAB — COMPREHENSIVE METABOLIC PANEL
ALT: 23 U/L (ref 0–44)
AST: 23 U/L (ref 15–41)
Albumin: 4.2 g/dL (ref 3.5–5.0)
Alkaline Phosphatase: 55 U/L (ref 38–126)
Anion gap: 11 (ref 5–15)
BUN: 19 mg/dL (ref 8–23)
CO2: 25 mmol/L (ref 22–32)
Calcium: 9.2 mg/dL (ref 8.9–10.3)
Chloride: 101 mmol/L (ref 98–111)
Creatinine, Ser: 0.81 mg/dL (ref 0.44–1.00)
GFR, Estimated: >60 mL/min (ref >60)
Glucose, Bld: 99 mg/dL (ref 70–99)
Potassium: 4 mmol/L (ref 3.5–5.1)
Sodium: 137 mmol/L (ref 135–145)
Total Bilirubin: 0.7 mg/dL (ref 0.3–1.2)
Total Protein: 7.2 g/dL (ref 6.5–8.1)

## 2022-04-04 LAB — HEMOGLOBIN AND HEMATOCRIT, BLOOD
HCT: 42 % (ref 36.0–46.0)
HCT: 40.9 % (ref 36.0–46.0)
HCT: 38.3 % (ref 36.0–46.0)
Hemoglobin: 13.8 g/dL (ref 12.0–15.0)
Hemoglobin: 13.6 g/dL (ref 12.0–15.0)
Hemoglobin: 12.5 g/dL (ref 12.0–15.0)

## 2022-04-04 LAB — ABO/RH: ABO/RH(D): O POS

## 2022-04-04 LAB — MAGNESIUM: Magnesium: 2.1 mg/dL (ref 1.7–2.4)

## 2022-04-04 LAB — PHOSPHORUS: Phosphorus: 3.8 mg/dL (ref 2.5–4.6)

## 2022-04-04 MED ORDER — POLYETHYLENE GLYCOL 3350 17 G PO PACK
17.0000 g | PACK | Freq: Every day | ORAL | Status: DC
Start: 2022-04-04 — End: 2022-04-06
  Administered 2022-04-04 – 2022-04-05 (×2): 17 g via ORAL
  Filled 2022-04-04 (×3): qty 1

## 2022-04-04 NOTE — Plan of Care (Signed)
  Problem: Safety: Goal: Ability to remain free from injury will improve Outcome: Progressing   

## 2022-04-04 NOTE — Progress Notes (Signed)
Report received from SBAR/Lou Merricks RN/ED. Pt arrived to unit via [facility] @ 0015, ambulated to bed w/ SB assist. Pt handbook given to Pt. Call bell education initiated and within reach of Pt. No needs voiced at this time.

## 2022-04-04 NOTE — Progress Notes (Signed)
Transition of Care [facility]) Screening Note  Patient Details  Name: Amy [name] Date of Birth: 1933-03-17  Transition of Care [facility]) CM/SW Contact:    Sherie Don, LCSW Phone Number: 04/04/2022, 10:57 AM  Transition of Care Department [facility]) has reviewed patient and no TOC needs have been identified at this time. We will continue to monitor patient advancement through interdisciplinary progression rounds. If new patient transition needs arise, please place a TOC consult.

## 2022-04-04 NOTE — Progress Notes (Signed)
PROGRESS NOTE    Amy [name]  FVC:[id number] DOB: October 24, 1933 DOA: 04/03/2022 PCP: Antony Contras, MD     Brief Narrative:  88 year old WF PMHx Chronic Systolic CHF(LVEF of 20 to 25%), paroxysmal atrial fibrillation, chronic Xarelto, Rheumatic heart disease HTN, HLD,   Presents to the ER today with a 1 day history of bright red blood per rectum.  Patient states that she does not have a history of GI bleeding.  Does not have a history of hemorrhoids.  She states that she was having a bowel movement today in the toilet.  She looked down and saw her toilet filled with blood.  She also had stool mixed in with the blood.  She came to the ER for evaluation.  Patient denies any chest pain, lightheadedness,  Abdominal pain.   Patient is on Xarelto for anticoagulation for A-fib.   Subjective: A/O x4, negative abdominal pain, negative nausea.  States has not had BM today.  States did have hard BM.   Assessment & Plan: Covid vaccination;   Principal Problem:   Acute lower GI bleeding Active Problems:   Benign essential HTN   Hyperlipidemia   Paroxysmal atrial fibrillation (HCC)   Chronic systolic CHF (congestive heart failure) (HCC) - LVEF 20-25%   Chronic anticoagulation - on Xarelto for afib   DNR (do not resuscitate)/DNI(Do Not Intubate)  Acute lower GI bleeding -CT mesenteric angio ordered.   -Check H&H every 6 hours.   -Discussed with the patient potential for needing intervention either via colonoscopy or interventional radiology.   -Type and screen ordered. Lab Results  Component Value Date   HGB 13.8 04/04/2022   HGB 13.0 04/03/2022   HGB 13.3 02/19/2022   HGB 14.0 03/15/2021   HGB 13.5 03/14/2021  -6/8 currently no signs of GI bleed, if patient's hemoglobin stable overnight will discharge on 6/9   Chronic systolic CHF (congestive heart failure) (HCC) - LVEF 20-25% Stable.  Euvolemic.  Continue GDMT   Paroxysmal atrial fibrillation (HCC) -6/8 currently NSR - Amiodarone  100 mg daily - Xarelto (hold) GI bleed. -6/8 follow-up with cardiologist to determine when/if she should restart Xarelto    Hyperlipidemia -Continue Crestor 20 mg daily.  -6/8 lipid panel pending  Benign essential HTN -ontinue Coreg 6.25 mg, Cozaar 25 mg, Aldactone 12.5 mg.  Hold for systolic blood pressure less than 120 or heart rate less than 60.             Mobility Assessment (last 72 hours)     Mobility Assessment     Row Name 04/04/22 0039           Does patient have an order for bedrest or is patient medically unstable No - Continue assessment       What is the highest level of mobility based on the progressive mobility assessment? Level 5 (Walks with assist in room/[place]) - Balance while stepping forward/back and can walk in room with assist - Complete                  Interdisciplinary Goals of Care Family Meeting   Date carried out: 04/04/2022  Location of the meeting:   Member's involved:   Durable Power of Tour manager:     Discussion: We discussed goals of care for [employer] .    Code status:   Disposition:   Time spent for the meeting:     Maanvi Lecompte, Geraldo Docker, MD  04/04/2022, 7:23 AM  DVT prophylaxis: SCD Code Status: DNR Family Communication:  Status is: Inpatient    Dispo: The patient is from: Home              Anticipated d/c is to: Home              Anticipated d/c date is: 1 day              Patient currently is not medically stable to d/c.      Consultants:    Procedures/Significant Events:  6/7 CT angiogram GI bleed VASCULAR   1. No evidence for superior mesenteric artery thrombosis. 2. 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 3. No evidence for aortic dissection or aneurysm. 4.  Aortic Atherosclerosis (ICD10-I70.0).   NON-VASCULAR   1. No evidence for acute gastrointestinal hemorrhage. 2. Right inguinal hernia contains nondilated bowel. No  bowel obstruction. 3. Diffuse colonic diverticulosis without evidence for diverticulitis. Large stool burden. 4. Moderate-sized hiatal hernia.  I have personally reviewed and interpreted all radiology studies and my findings are as above.  VENTILATOR SETTINGS:    Cultures   Antimicrobials:    Devices    LINES / TUBES:      Continuous Infusions:   Objective: Vitals:   04/04/22 0021 04/04/22 0129 04/04/22 0446 04/04/22 0535  BP: (!) 160/69 (!) 160/65  (!) 160/57  Pulse: 75 78  70  Resp: '16 16  14  '$ Temp: 97.7 F (36.5 C) (!) 97.5 F (36.4 C)  (!) 97.5 F (36.4 C)  TempSrc:  Oral  Oral  SpO2: 100% 95%  100%  Weight:   57.2 kg   Height:        Intake/Output Summary (Last 24 hours) at 04/04/2022 9024 Last data filed at 04/04/2022 0600 Gross per 24 hour  Intake 240 ml  Output 300 ml  Net -60 ml   Filed Weights   04/03/22 1353 04/04/22 0446  Weight: 59.4 kg 57.2 kg    Examination:  General: A/O x4 No acute respiratory distress Eyes: negative scleral hemorrhage, negative anisocoria, negative icterus ENT: Negative Runny nose, negative gingival bleeding, Neck:  Negative scars, masses, torticollis, lymphadenopathy, JVD Lungs: Clear to auscultation bilaterally without wheezes or crackles Cardiovascular: Regular rate and rhythm without murmur gallop or rub normal S1 and S2 Abdomen: negative abdominal pain, nondistended, positive soft, bowel sounds, no rebound, no ascites, no appreciable mass Extremities: No significant cyanosis, clubbing, or edema bilateral lower extremities Skin: Negative rashes, lesions, ulcers Psychiatric:  Negative depression, negative anxiety, negative fatigue, negative mania  Central nervous system:  Cranial nerves II through XII intact, tongue/uvula midline, all extremities muscle strength 5/5, sensation intact throughout, negative dysarthria, negative expressive aphasia, negative receptive aphasia.  .     Data Reviewed: Care during  the described time interval was provided by me .  I have reviewed this patient's available data, including medical history, events of note, physical examination, and all test results as part of my evaluation.  CBC: Recent Labs  Lab 04/03/22 1422 04/04/22 0028  WBC 7.2  --   NEUTROABS 5.1  --   HGB 13.0 13.8  HCT 39.4 42.0  MCV 96.8  --   PLT 237  --    Basic Metabolic Panel: Recent Labs  Lab 04/03/22 1422 04/04/22 0028  NA 136 137  K 4.6 4.0  CL 104 101  CO2 24 25  GLUCOSE 112* 99  BUN 23 19  CREATININE 0.73 0.81  CALCIUM 9.0 9.2   GFR: Estimated  Creatinine Clearance: 38.8 mL/min (by C-G formula based on SCr of 0.81 mg/dL). Liver Function Tests: Recent Labs  Lab 04/03/22 1422 04/04/22 0028  AST 27 23  ALT 24 23  ALKPHOS 58 55  BILITOT 0.5 0.7  PROT 6.9 7.2  ALBUMIN 4.0 4.2   Recent Labs  Lab 04/03/22 1422  LIPASE 42   No results for input(s): "AMMONIA" in the last 168 hours. Coagulation Profile: No results for input(s): "INR", "PROTIME" in the last 168 hours. Cardiac Enzymes: No results for input(s): "CKTOTAL", "CKMB", "CKMBINDEX", "TROPONINI" in the last 168 hours. BNP (last 3 results) No results for input(s): "PROBNP" in the last 8760 hours. HbA1C: No results for input(s): "HGBA1C" in the last 72 hours. CBG: No results for input(s): "GLUCAP" in the last 168 hours. Lipid Profile: No results for input(s): "CHOL", "HDL", "LDLCALC", "TRIG", "CHOLHDL", "LDLDIRECT" in the last 72 hours. Thyroid Function Tests: No results for input(s): "TSH", "T4TOTAL", "FREET4", "T3FREE", "THYROIDAB" in the last 72 hours. Anemia Panel: No results for input(s): "VITAMINB12", "FOLATE", "FERRITIN", "TIBC", "IRON", "RETICCTPCT" in the last 72 hours. Sepsis Labs: No results for input(s): "PROCALCITON", "LATICACIDVEN" in the last 168 hours.  No results found for this or any previous visit (from the past 240 hour(s)).       Radiology Studies: CT ANGIO GI BLEED  Result  Date: 04/03/2022 CLINICAL DATA:  Acute mesenteric ischemia. Abdominal pain and bleeding. EXAM: CTA ABDOMEN AND PELVIS WITHOUT AND WITH CONTRAST TECHNIQUE: Multidetector CT imaging of the abdomen and pelvis was performed using the standard protocol during bolus administration of intravenous contrast. Multiplanar reconstructed images and MIPs were obtained and reviewed to evaluate the vascular anatomy. RADIATION DOSE REDUCTION: This exam was performed according to the departmental dose-optimization program which includes automated exposure control, adjustment of the mA and/or kV according to patient size and/or use of iterative reconstruction technique. CONTRAST:  152m OMNIPAQUE IOHEXOL 350 MG/ML SOLN COMPARISON:  None Available. FINDINGS: VASCULAR Aorta: Normal caliber aorta without aneurysm, dissection, vasculitis or significant stenosis. There are atherosclerotic calcifications of the aorta. Celiac: Patent without evidence of aneurysm, dissection, vasculitis or significant stenosis. Atherosclerotic calcifications are present in the branch vessels. SMA: Patent without evidence of aneurysm, dissection, vasculitis or significant stenosis. Renals: Both renal arteries are patent without evidence of aneurysm, dissection, vasculitis, fibromuscular dysplasia or significant stenosis. IMA: Patent without evidence of aneurysm, dissection, vasculitis or significant stenosis. Inflow: Patent without evidence of aneurysm, dissection, vasculitis or significant stenosis. Atherosclerotic calcifications are present. Proximal Outflow: There is occlusion of the superficial femoral arteries bilaterally. Bilateral common femoral and profunda femoral arteries are patent without evidence of aneurysm, dissection, vasculitis or significant stenosis. Veins: No obvious venous abnormality within the limitations of this arterial phase study. Review of the MIP images confirms the above findings. NON-VASCULAR Lower chest: There is some atelectasis  in the right lung base. Hepatobiliary: No focal liver abnormality is seen. No gallstones, gallbladder wall thickening, or biliary dilatation. Pancreas: Unremarkable. No pancreatic ductal dilatation or surrounding inflammatory changes. Spleen: Normal in size without focal abnormality. Adrenals/Urinary Tract: There is a 1.7 cm cyst in the left kidney. There is no hydronephrosis or perinephric fluid. The adrenal glands and bladder are within normal limits. Stomach/Bowel: There is no evidence for active gastrointestinal bleeding. There is no bowel obstruction, focal wall thickening or inflammation. The appendix is not seen. There is diffuse colonic diverticulosis without evidence for diverticulitis. Moderate-sized hiatal hernia is again seen. Lymphatic: No enlarged lymph nodes are seen. Reproductive: Uterus and bilateral adnexa are  unremarkable. Other: Small right inguinal hernia containing nondilated bowel. Small fat containing left inguinal hernia. No ascites or free air. Musculoskeletal: No acute or significant osseous findings. IMPRESSION: VASCULAR 1. No evidence for superior mesenteric artery thrombosis. 2. 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 3. No evidence for aortic dissection or aneurysm. 4.  Aortic Atherosclerosis (ICD10-I70.0). NON-VASCULAR 1. No evidence for acute gastrointestinal hemorrhage. 2. Right inguinal hernia contains nondilated bowel. No bowel obstruction. 3. Diffuse colonic diverticulosis without evidence for diverticulitis. Large stool burden. 4. Moderate-sized hiatal hernia. Electronically Signed   By: Ronney Asters M.D.   On: 04/03/2022 21:59        Scheduled Meds:  amiodarone  100 mg Oral Daily   carvedilol  6.25 mg Oral BID WC   losartan  25 mg Oral Daily   rosuvastatin  20 mg Oral q morning   spironolactone  12.5 mg Oral Daily   Continuous Infusions:   LOS: 0 days    Time spent:40 min    Caedence Snowden, Geraldo Docker, MD [facility]   If  7PM-7AM, please contact night-coverage 04/04/2022, 7:23 AM

## 2022-04-05 DIAGNOSIS — I10 Essential (primary) hypertension: Secondary | ICD-10-CM | POA: Diagnosis not present

## 2022-04-05 DIAGNOSIS — I480 Paroxysmal atrial fibrillation: Secondary | ICD-10-CM | POA: Diagnosis not present

## 2022-04-05 DIAGNOSIS — I5022 Chronic systolic (congestive) heart failure: Secondary | ICD-10-CM | POA: Diagnosis not present

## 2022-04-05 DIAGNOSIS — K922 Gastrointestinal hemorrhage, unspecified: Secondary | ICD-10-CM | POA: Diagnosis not present

## 2022-04-05 DIAGNOSIS — Z7901 Long term (current) use of anticoagulants: Secondary | ICD-10-CM | POA: Diagnosis not present

## 2022-04-05 DIAGNOSIS — Z66 Do not resuscitate: Secondary | ICD-10-CM | POA: Diagnosis not present

## 2022-04-05 DIAGNOSIS — E7800 Pure hypercholesterolemia, unspecified: Secondary | ICD-10-CM | POA: Diagnosis not present

## 2022-04-05 LAB — CBC WITH DIFFERENTIAL/PLATELET
Abs Immature Granulocytes: 0.03 10*3/uL (ref 0.00–0.07)
Basophils Absolute: 0.1 10*3/uL (ref 0.0–0.1)
Basophils Relative: 1 %
Eosinophils Absolute: 0.2 10*3/uL (ref 0.0–0.5)
Eosinophils Relative: 2 %
HCT: 39.8 % (ref 36.0–46.0)
Hemoglobin: 13 g/dL (ref 12.0–15.0)
Immature Granulocytes: 0 %
Lymphocytes Relative: 22 %
Lymphs Abs: 1.7 10*3/uL (ref 0.7–4.0)
MCH: 31.9 pg (ref 26.0–34.0)
MCHC: 32.7 g/dL (ref 30.0–36.0)
MCV: 97.5 fL (ref 80.0–100.0)
Monocytes Absolute: 1.1 10*3/uL — ABNORMAL HIGH (ref 0.1–1.0)
Monocytes Relative: 14 %
Neutro Abs: 4.9 10*3/uL (ref 1.7–7.7)
Neutrophils Relative %: 61 %
Platelets: 201 10*3/uL (ref 150–400)
RBC: 4.08 MIL/uL (ref 3.87–5.11)
RDW: 12.8 % (ref 11.5–15.5)
WBC: 7.9 10*3/uL (ref 4.0–10.5)
nRBC: 0 % (ref 0.0–0.2)

## 2022-04-05 LAB — PHOSPHORUS: Phosphorus: 4.2 mg/dL (ref 2.5–4.6)

## 2022-04-05 LAB — COMPREHENSIVE METABOLIC PANEL
ALT: 20 U/L (ref 0–44)
AST: 21 U/L (ref 15–41)
Albumin: 3.8 g/dL (ref 3.5–5.0)
Alkaline Phosphatase: 51 U/L (ref 38–126)
Anion gap: 8 (ref 5–15)
BUN: 13 mg/dL (ref 8–23)
CO2: 25 mmol/L (ref 22–32)
Calcium: 9.1 mg/dL (ref 8.9–10.3)
Chloride: 102 mmol/L (ref 98–111)
Creatinine, Ser: 0.79 mg/dL (ref 0.44–1.00)
GFR, Estimated: >60 mL/min (ref >60)
Glucose, Bld: 88 mg/dL (ref 70–99)
Potassium: 3.8 mmol/L (ref 3.5–5.1)
Sodium: 135 mmol/L (ref 135–145)
Total Bilirubin: 1 mg/dL (ref 0.3–1.2)
Total Protein: 6.7 g/dL (ref 6.5–8.1)

## 2022-04-05 LAB — LIPID PANEL
Cholesterol: 129 mg/dL (ref 0–200)
HDL: 66 mg/dL (ref >40)
LDL Cholesterol: 46 mg/dL (ref 0–99)
Total CHOL/HDL Ratio: 2 RATIO
Triglycerides: 83 mg/dL (ref <150)
VLDL: 17 mg/dL (ref 0–40)

## 2022-04-05 LAB — MAGNESIUM: Magnesium: 2 mg/dL (ref 1.7–2.4)

## 2022-04-05 MED ORDER — BISACODYL 10 MG RE SUPP
10.0000 mg | Freq: Once | RECTAL | Status: AC
Start: 2022-04-05 — End: 2022-04-05
  Administered 2022-04-05: 10 mg via RECTAL
  Filled 2022-04-05: qty 1

## 2022-04-05 MED ORDER — SORBITOL 70 % SOLN
400.0000 mL | TOPICAL_OIL | Freq: Once | ORAL | Status: DC
  Filled 2022-04-05: qty 120

## 2022-04-05 NOTE — Progress Notes (Signed)
PROGRESS NOTE    Amy [name]  GGY:[id number] DOB: 1933-10-11 DOA: 04/03/2022 PCP: Antony Contras, MD     Brief Narrative:  88 year old WF PMHx Chronic Systolic CHF(LVEF of 20 to 25%), paroxysmal atrial fibrillation, chronic Xarelto, Rheumatic heart disease HTN, HLD,   Presents to the ER today with a 1 day history of bright red blood per rectum.  Patient states that she does not have a history of GI bleeding.  Does not have a history of hemorrhoids.  She states that she was having a bowel movement today in the toilet.  She looked down and saw her toilet filled with blood.  She also had stool mixed in with the blood.  She came to the ER for evaluation.  Patient denies any chest pain, lightheadedness,  Abdominal pain.   Patient is on Xarelto for anticoagulation for A-fib.   Subjective: 6/9 A/O x4, negative abdominal pain, positive nausea but has resolved.  Positive BM today with minimal blood.    Assessment & Plan: Covid vaccination;   Principal Problem:   Acute lower GI bleeding Active Problems:   Benign essential HTN   Hyperlipidemia   Paroxysmal atrial fibrillation (HCC)   Chronic systolic CHF (congestive heart failure) (HCC) - LVEF 20-25%   Chronic anticoagulation - on Xarelto for afib   DNR (do not resuscitate)/DNI(Do Not Intubate)  Acute lower GI bleeding -CT mesenteric angio ordered.   -Check H&H every 6 hours.   -Discussed with the patient potential for needing intervention either via colonoscopy or interventional radiology.   -Type and screen ordered. Lab Results  Component Value Date   HGB 13.0 04/05/2022   HGB 12.5 04/04/2022   HGB 13.6 04/04/2022   HGB 13.8 04/04/2022   HGB 13.0 04/03/2022  -6/8 currently no signs of GI bleed, if patient's hemoglobin stable overnight will discharge on 6/10 - 6/9 advance diet heart healthy and if patient tolerates overnight without N/V/GI bleed discharge in a.m.   Chronic systolic CHF (congestive heart failure) (HCC) - LVEF  20-25% -Stable.  Euvolemic.  Continue GDMT   Paroxysmal atrial fibrillation (HCC) -6/9 currently NSR - Amiodarone 100 mg daily - Xarelto (hold) GI bleed. -6/8 follow-up with cardiologist to determine when/if she should restart Xarelto    Hyperlipidemia -Continue Crestor 20 mg daily.  -6/9 LDL=46; within ADA guidelines   Benign essential HTN -Coreg 6.25 mg,  -Cozaar 25 mg,  -Aldactone 12.5 mg.  Hold for systolic blood pressure less than 120 or heart rate less than 60.             Mobility Assessment (last 72 hours)     Mobility Assessment     Row Name 04/04/22 0350 04/04/22 0039         Does patient have an order for bedrest or is patient medically unstable No - Continue assessment No - Continue assessment      What is the highest level of mobility based on the progressive mobility assessment? Level 5 (Walks with assist in room/[place]) - Balance while stepping forward/back and can walk in room with assist - Complete Level 5 (Walks with assist in room/[place]) - Balance while stepping forward/back and can walk in room with assist - Complete                 Interdisciplinary Goals of Care Family Meeting   Date carried out: 04/05/2022  Location of the meeting:   Member's involved:   Durable Power of Tour manager:  Discussion: We discussed goals of care for [facility] .    Code status:   Disposition:   Time spent for the meeting:     Lucky Alverson, Geraldo Docker, MD  04/05/2022, 10:30 AM         DVT prophylaxis: SCD Code Status: DNR Family Communication:  Status is: Inpatient    Dispo: The patient is from: Home              Anticipated d/c is to: Home              Anticipated d/c date is: 1 day              Patient currently is not medically stable to d/c.      Consultants:    Procedures/Significant Events:  6/7 CT angiogram GI bleed VASCULAR   1. No evidence for superior mesenteric artery thrombosis. 2. Occlusion  of the bilateral visualized superficial femoral arteries. 3. No evidence for aortic dissection or aneurysm. 4.  Aortic Atherosclerosis (ICD10-I70.0).   NON-VASCULAR   1. No evidence for acute gastrointestinal hemorrhage. 2. Right inguinal hernia contains nondilated bowel. No bowel obstruction. 3. Diffuse colonic diverticulosis without evidence for diverticulitis. Large stool burden. 4. Moderate-sized hiatal hernia.  I have personally reviewed and interpreted all radiology studies and my findings are as above.  VENTILATOR SETTINGS:    Cultures   Antimicrobials:    Devices    LINES / TUBES:      Continuous Infusions:   Objective: Vitals:   04/04/22 1404 04/04/22 2138 04/05/22 0424 04/05/22 0444  BP:  (!) 157/67  (!) 138/53  Pulse: 65 69  63  Resp:  16  18  Temp:  98.4 F (36.9 C)  97.8 F (36.6 C)  TempSrc:  Oral  Oral  SpO2: (!) 86% 96%  95%  Weight:   59.9 kg   Height:        Intake/Output Summary (Last 24 hours) at 04/05/2022 1030 Last data filed at 04/05/2022 0900 Gross per 24 hour  Intake 1600 ml  Output 2350 ml  Net -750 ml    Filed Weights   04/03/22 1353 04/04/22 0446 04/05/22 0424  Weight: 59.4 kg 57.2 kg 59.9 kg    Examination:  General: A/O x4 No acute respiratory distress Eyes: negative scleral hemorrhage, negative anisocoria, negative icterus ENT: Negative Runny nose, negative gingival bleeding, Neck:  Negative scars, masses, torticollis, lymphadenopathy, JVD Lungs: Clear to auscultation bilaterally without wheezes or crackles Cardiovascular: Regular rate and rhythm without murmur gallop or rub normal S1 and S2 Abdomen: negative abdominal pain, nondistended, positive soft, bowel sounds, no rebound, no ascites, no appreciable mass Extremities: No significant cyanosis, clubbing, or edema bilateral lower extremities Skin: Negative rashes, lesions, ulcers Psychiatric:  Negative depression, negative anxiety, negative fatigue, negative  mania  Central nervous system:  Cranial nerves II through XII intact, tongue/uvula midline, all extremities muscle strength 5/5, sensation intact throughout, negative dysarthria, negative expressive aphasia, negative receptive aphasia.  .     Data Reviewed: Care during the described time interval was provided by me .  I have reviewed this patient's available data, including medical history, events of note, physical examination, and all test results as part of my evaluation.  CBC: Recent Labs  Lab 04/03/22 1422 04/04/22 0028 04/04/22 0745 04/04/22 1430 04/05/22 0454  WBC 7.2  --   --   --  7.9  NEUTROABS 5.1  --   --   --  4.9  HGB 13.0 13.8  13.6 12.5 13.0  HCT 39.4 42.0 40.9 38.3 39.8  MCV 96.8  --   --   --  97.5  PLT 237  --   --   --  825    Basic Metabolic Panel: Recent Labs  Lab 04/03/22 1422 04/04/22 0028 04/04/22 0745 04/05/22 0454  NA 136 137  --  135  K 4.6 4.0  --  3.8  CL 104 101  --  102  CO2 24 25  --  25  GLUCOSE 112* 99  --  88  BUN 23 19  --  13  CREATININE 0.73 0.81  --  0.79  CALCIUM 9.0 9.2  --  9.1  MG  --   --  2.1 2.0  PHOS  --   --  3.8 4.2    GFR: Estimated Creatinine Clearance: 40.1 mL/min (by C-G formula based on SCr of 0.79 mg/dL). Liver Function Tests: Recent Labs  Lab 04/03/22 1422 04/04/22 0028 04/05/22 0454  AST '27 23 21  '$ ALT '24 23 20  '$ ALKPHOS 58 55 51  BILITOT 0.5 0.7 1.0  PROT 6.9 7.2 6.7  ALBUMIN 4.0 4.2 3.8    Recent Labs  Lab 04/03/22 1422  LIPASE 42    No results for input(s): "AMMONIA" in the last 168 hours. Coagulation Profile: No results for input(s): "INR", "PROTIME" in the last 168 hours. Cardiac Enzymes: No results for input(s): "CKTOTAL", "CKMB", "CKMBINDEX", "TROPONINI" in the last 168 hours. BNP (last 3 results) No results for input(s): "PROBNP" in the last 8760 hours. HbA1C: No results for input(s): "HGBA1C" in the last 72 hours. CBG: No results for input(s): "GLUCAP" in the last 168  hours. Lipid Profile: Recent Labs    04/05/22 0454  CHOL 129  HDL 66  LDLCALC 46  TRIG 83  CHOLHDL 2.0   Thyroid Function Tests: No results for input(s): "TSH", "T4TOTAL", "FREET4", "T3FREE", "THYROIDAB" in the last 72 hours. Anemia Panel: No results for input(s): "VITAMINB12", "FOLATE", "FERRITIN", "TIBC", "IRON", "RETICCTPCT" in the last 72 hours. Sepsis Labs: No results for input(s): "PROCALCITON", "LATICACIDVEN" in the last 168 hours.  No results found for this or any previous visit (from the past 240 hour(s)).       Radiology Studies: CT ANGIO GI BLEED  Result Date: 04/03/2022 CLINICAL DATA:  Acute mesenteric ischemia. Abdominal pain and bleeding. EXAM: CTA ABDOMEN AND PELVIS WITHOUT AND WITH CONTRAST TECHNIQUE: Multidetector CT imaging of the abdomen and pelvis was performed using the standard protocol during bolus administration of intravenous contrast. Multiplanar reconstructed images and MIPs were obtained and reviewed to evaluate the vascular anatomy. RADIATION DOSE REDUCTION: This exam was performed according to the departmental dose-optimization program which includes automated exposure control, adjustment of the mA and/or kV according to patient size and/or use of iterative reconstruction technique. CONTRAST:  159m OMNIPAQUE IOHEXOL 350 MG/ML SOLN COMPARISON:  None Available. FINDINGS: VASCULAR Aorta: Normal caliber aorta without aneurysm, dissection, vasculitis or significant stenosis. There are atherosclerotic calcifications of the aorta. Celiac: Patent without evidence of aneurysm, dissection, vasculitis or significant stenosis. Atherosclerotic calcifications are present in the branch vessels. SMA: Patent without evidence of aneurysm, dissection, vasculitis or significant stenosis. Renals: Both renal arteries are patent without evidence of aneurysm, dissection, vasculitis, fibromuscular dysplasia or significant stenosis. IMA: Patent without evidence of aneurysm, dissection,  vasculitis or significant stenosis. Inflow: Patent without evidence of aneurysm, dissection, vasculitis or significant stenosis. Atherosclerotic calcifications are present. Proximal Outflow: There is occlusion of the superficial femoral arteries bilaterally. Bilateral common  femoral and profunda femoral arteries are patent without evidence of aneurysm, dissection, vasculitis or significant stenosis. Veins: No obvious venous abnormality within the limitations of this arterial phase study. Review of the MIP images confirms the above findings. NON-VASCULAR Lower chest: There is some atelectasis in the right lung base. Hepatobiliary: No focal liver abnormality is seen. No gallstones, gallbladder wall thickening, or biliary dilatation. Pancreas: Unremarkable. No pancreatic ductal dilatation or surrounding inflammatory changes. Spleen: Normal in size without focal abnormality. Adrenals/Urinary Tract: There is a 1.7 cm cyst in the left kidney. There is no hydronephrosis or perinephric fluid. The adrenal glands and bladder are within normal limits. Stomach/Bowel: There is no evidence for active gastrointestinal bleeding. There is no bowel obstruction, focal wall thickening or inflammation. The appendix is not seen. There is diffuse colonic diverticulosis without evidence for diverticulitis. Moderate-sized hiatal hernia is again seen. Lymphatic: No enlarged lymph nodes are seen. Reproductive: Uterus and bilateral adnexa are unremarkable. Other: Small right inguinal hernia containing nondilated bowel. Small fat containing left inguinal hernia. No ascites or free air. Musculoskeletal: No acute or significant osseous findings. IMPRESSION: VASCULAR 1. No evidence for superior mesenteric artery thrombosis. 2. 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 3. No evidence for aortic dissection or aneurysm. 4.  Aortic Atherosclerosis (ICD10-I70.0). NON-VASCULAR 1. No evidence for acute gastrointestinal  hemorrhage. 2. Right inguinal hernia contains nondilated bowel. No bowel obstruction. 3. Diffuse colonic diverticulosis without evidence for diverticulitis. Large stool burden. 4. Moderate-sized hiatal hernia. Electronically Signed   By: Ronney Asters M.D.   On: 04/03/2022 21:59        Scheduled Meds:  amiodarone  100 mg Oral Daily   bisacodyl  10 mg Rectal Once   carvedilol  6.25 mg Oral BID WC   losartan  25 mg Oral Daily   polyethylene glycol  17 g Oral Daily   rosuvastatin  20 mg Oral q morning   sorbitol, milk of mag, mineral oil, glycerin (SMOG) enema  400 mL Rectal Once   spironolactone  12.5 mg Oral Daily   Continuous Infusions:   LOS: 0 days    Time spent:40 min    Cerys Winget, Geraldo Docker, MD [facility]   If 7PM-7AM, please contact night-coverage 04/05/2022, 10:30 AM

## 2022-04-06 DIAGNOSIS — Z7901 Long term (current) use of anticoagulants: Secondary | ICD-10-CM | POA: Diagnosis not present

## 2022-04-06 DIAGNOSIS — K922 Gastrointestinal hemorrhage, unspecified: Secondary | ICD-10-CM | POA: Diagnosis not present

## 2022-04-06 DIAGNOSIS — I480 Paroxysmal atrial fibrillation: Secondary | ICD-10-CM | POA: Diagnosis not present

## 2022-04-06 DIAGNOSIS — I5022 Chronic systolic (congestive) heart failure: Secondary | ICD-10-CM | POA: Diagnosis not present

## 2022-04-06 DIAGNOSIS — Z66 Do not resuscitate: Secondary | ICD-10-CM | POA: Diagnosis not present

## 2022-04-06 DIAGNOSIS — E7800 Pure hypercholesterolemia, unspecified: Secondary | ICD-10-CM | POA: Diagnosis not present

## 2022-04-06 DIAGNOSIS — I10 Essential (primary) hypertension: Secondary | ICD-10-CM | POA: Diagnosis not present

## 2022-04-06 LAB — COMPREHENSIVE METABOLIC PANEL
ALT: 20 U/L (ref 0–44)
AST: 20 U/L (ref 15–41)
Albumin: 3.7 g/dL (ref 3.5–5.0)
Alkaline Phosphatase: 53 U/L (ref 38–126)
Anion gap: 9 (ref 5–15)
BUN: 21 mg/dL (ref 8–23)
CO2: 26 mmol/L (ref 22–32)
Calcium: 9.2 mg/dL (ref 8.9–10.3)
Chloride: 104 mmol/L (ref 98–111)
Creatinine, Ser: 1.07 mg/dL — ABNORMAL HIGH (ref 0.44–1.00)
GFR, Estimated: 50 mL/min — ABNORMAL LOW (ref >60)
Glucose, Bld: 97 mg/dL (ref 70–99)
Potassium: 4 mmol/L (ref 3.5–5.1)
Sodium: 139 mmol/L (ref 135–145)
Total Bilirubin: 0.8 mg/dL (ref 0.3–1.2)
Total Protein: 6.7 g/dL (ref 6.5–8.1)

## 2022-04-06 LAB — PHOSPHORUS: Phosphorus: 4.6 mg/dL (ref 2.5–4.6)

## 2022-04-06 LAB — CBC WITH DIFFERENTIAL/PLATELET
Abs Immature Granulocytes: 0.04 10*3/uL (ref 0.00–0.07)
Basophils Absolute: 0 10*3/uL (ref 0.0–0.1)
Basophils Relative: 0 %
Eosinophils Absolute: 0.1 10*3/uL (ref 0.0–0.5)
Eosinophils Relative: 2 %
HCT: 38.5 % (ref 36.0–46.0)
Hemoglobin: 12.5 g/dL (ref 12.0–15.0)
Immature Granulocytes: 1 %
Lymphocytes Relative: 23 %
Lymphs Abs: 1.8 10*3/uL (ref 0.7–4.0)
MCH: 31.4 pg (ref 26.0–34.0)
MCHC: 32.5 g/dL (ref 30.0–36.0)
MCV: 96.7 fL (ref 80.0–100.0)
Monocytes Absolute: 1.1 10*3/uL — ABNORMAL HIGH (ref 0.1–1.0)
Monocytes Relative: 15 %
Neutro Abs: 4.6 10*3/uL (ref 1.7–7.7)
Neutrophils Relative %: 59 %
Platelets: 210 10*3/uL (ref 150–400)
RBC: 3.98 MIL/uL (ref 3.87–5.11)
RDW: 12.8 % (ref 11.5–15.5)
WBC: 7.7 10*3/uL (ref 4.0–10.5)
nRBC: 0 % (ref 0.0–0.2)

## 2022-04-06 LAB — MAGNESIUM: Magnesium: 2.3 mg/dL (ref 1.7–2.4)

## 2022-04-06 MED ORDER — ONDANSETRON HCL 4 MG PO TABS: 4.0000 mg | ORAL_TABLET | Freq: Four times a day (QID) | ORAL | 0 refills | Status: DC | PRN

## 2022-04-06 NOTE — TOC Transition Note (Signed)
Transition of Care [facility]) - CM/SW Discharge Note   Patient Details  Name: Amy [name] MRN: [id number] Date of Birth: 26-Aug-1933  Transition of [facility]) CM/SW Contact:  Leeroy Cha, RN Phone Number: 04/06/2022, 12:34 PM   Clinical Narrative:    Chart review dcd to home with no toc needs.     Barriers to Discharge: No Barriers Identified   Patient Goals and CMS Choice        Discharge Placement                       Discharge Plan and Services                                     Social Determinants of Health (SDOH) Interventions     Readmission Risk Interventions     No data to display

## 2022-04-06 NOTE — Discharge Summary (Signed)
Physician Discharge Summary  Amy [name] JSE:[id number] DOB: 07-12-1933 DOA: 04/03/2022  PCP: Antony Contras, MD  Admit date: 04/03/2022 Discharge date: 04/06/2022  Time spent: 35 minutes  Recommendations for Outpatient Follow-up:   Acute lower GI bleeding -CT mesenteric angio ordered.   -Check H&H every 6 hours.   -Discussed with the patient potential for needing intervention either via colonoscopy or interventional radiology.   -Type and screen ordered. Lab Results  Component Value Date   HGB 12.5 04/06/2022   HGB 13.0 04/05/2022   HGB 12.5 04/04/2022   HGB 13.6 04/04/2022   HGB 13.8 04/04/2022   -6/8 currently no signs of GI bleed, if patient's hemoglobin stable overnight will discharge on 6/10 - 6/9 advance diet heart healthy and if patient tolerates overnight without N/V/GI bleed discharge in a.m. -6/10 stable for discharge.  Follow-up with PCP/or return to ED  if you begin to experience GI bleed symptoms again.   Chronic systolic CHF (congestive heart failure) (HCC) - LVEF 20-25% -Stable.  Euvolemic.  Continue GDMT   Paroxysmal atrial fibrillation (HCC) -6/9 currently NSR - Amiodarone 100 mg daily - Xarelto (hold) GI bleed. -6/8 follow-up with Cardiologist to determine when/if she should restart Xarelto     Hyperlipidemia -Continue Crestor 20 mg daily.  -6/9 LDL=46; within ADA guidelines    Benign essential HTN -Coreg 6.25 mg,  -Cozaar 25 mg,  -Aldactone 12.5 mg.  Hold for systolic blood pressure less than 120 or heart rate less than 60.   Discharge Diagnoses:  Principal Problem:   Acute lower GI bleeding Active Problems:   Benign essential HTN   Hyperlipidemia   Paroxysmal atrial fibrillation (HCC)   Chronic systolic CHF (congestive heart failure) (HCC) - LVEF 20-25%   Chronic anticoagulation - on Xarelto for afib   DNR (do not resuscitate)/DNI(Do Not Intubate)   Discharge Condition: Stable  Diet recommendation: Heart healthy  Filed Weights   04/04/22  0446 04/05/22 0424 04/06/22 0500  Weight: 57.2 kg 59.9 kg 59 kg    History of present illness:  88 year old WF PMHx Chronic Systolic CHF(LVEF of 20 to 25%), paroxysmal atrial fibrillation, chronic Xarelto, Rheumatic heart disease HTN, HLD,    Presents to the ER today with a 1 day history of bright red blood per rectum.  Patient states that she does not have a history of GI bleeding.  Does not have a history of hemorrhoids.  She states that she was having a bowel movement today in the toilet.  She looked down and saw her toilet filled with blood.  She also had stool mixed in with the blood.  She came to the ER for evaluation.  Patient denies any chest pain, lightheadedness,  Abdominal pain.   Patient is on Xarelto for anticoagulation for A-fib.  Hospital Course:  See above  Procedures:  6/7 CT angiogram GI bleed VASCULAR   1. No evidence for superior mesenteric artery thrombosis. 2. 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 3. No evidence for aortic dissection or aneurysm. 4.  Aortic Atherosclerosis (ICD10-I70.0).   NON-VASCULAR   1. No evidence for acute gastrointestinal hemorrhage. 2. Right inguinal hernia contains nondilated bowel. No bowel obstruction. 3. Diffuse colonic diverticulosis without evidence for diverticulitis. Large stool burden. 4. Moderate-sized hiatal hernia.    Discharge Exam: Vitals:   04/05/22 2141 04/06/22 0416 04/06/22 0500 04/06/22 0747  BP: (!) 143/54 (!) 107/53  (!) 135/53  Pulse: 62 65    Resp: 18 19    Temp: 97.9 F (36.6  C) (!) 97.5 F (36.4 C)    TempSrc: Oral Oral    SpO2: 96% 92%    Weight:   59 kg   Height:        General: A/O x4 No acute respiratory distress Eyes: negative scleral hemorrhage, negative anisocoria, negative icterus ENT: Negative Runny nose, negative gingival bleeding, Neck:  Negative scars, masses, torticollis, lymphadenopathy, JVD Lungs: Clear to auscultation bilaterally without wheezes  or crackles Cardiovascular: Regular rate and rhythm without murmur gallop or rub normal S1 and S2 Abdomen: negative abdominal pain, nondistended, positive soft, bowel sounds, no rebound, no ascites, no appreciable mass  Discharge Instructions   Allergies as of 04/06/2022       Reactions   Lisinopril    Other reaction(s): cough   Oxycodone Nausea And Vomiting, Other (See Comments)   Intolerance--"its like I was floating up on the ceiling"         Medication List     STOP taking these medications    furosemide 40 MG tablet Commonly known as: LASIX   Xarelto 15 MG Tabs tablet Generic drug: Rivaroxaban       TAKE these medications    amiodarone 100 MG tablet Commonly known as: PACERONE Take 1 tablet (100 mg total) by mouth daily.   carvedilol 6.25 MG tablet Commonly known as: COREG Take 1 tablet (6.25 mg total) by mouth 2 (two) times daily with a meal.   losartan 25 MG tablet Commonly known as: COZAAR Take 1 tablet (25 mg total) by mouth daily.   ondansetron 4 MG tablet Commonly known as: ZOFRAN Take 1 tablet (4 mg total) by mouth every 6 (six) hours as needed for nausea.   rosuvastatin 20 MG tablet Commonly known as: CRESTOR Take 20 mg by mouth every evening.   spironolactone 25 MG tablet Commonly known as: ALDACTONE Take 0.5 tablets (12.5 mg total) by mouth daily.       Allergies  Allergen Reactions   Lisinopril     Other reaction(s): cough   Oxycodone Nausea And Vomiting and Other (See Comments)    Intolerance--"its like I was floating up on the ceiling"       The results of significant diagnostics from this hospitalization (including imaging, microbiology, ancillary and laboratory) are listed below for reference.    Significant Diagnostic Studies: CT ANGIO GI BLEED  Result Date: 04/03/2022 CLINICAL DATA:  Acute mesenteric ischemia. Abdominal pain and bleeding. EXAM: CTA ABDOMEN AND PELVIS WITHOUT AND WITH CONTRAST TECHNIQUE: Multidetector CT  imaging of the abdomen and pelvis was performed using the standard protocol during bolus administration of intravenous contrast. Multiplanar reconstructed images and MIPs were obtained and reviewed to evaluate the vascular anatomy. RADIATION DOSE REDUCTION: This exam was performed according to the departmental dose-optimization program which includes automated exposure control, adjustment of the mA and/or kV according to patient size and/or use of iterative reconstruction technique. CONTRAST:  17m OMNIPAQUE IOHEXOL 350 MG/ML SOLN COMPARISON:  None Available. FINDINGS: VASCULAR Aorta: Normal caliber aorta without aneurysm, dissection, vasculitis or significant stenosis. There are atherosclerotic calcifications of the aorta. Celiac: Patent without evidence of aneurysm, dissection, vasculitis or significant stenosis. Atherosclerotic calcifications are present in the branch vessels. SMA: Patent without evidence of aneurysm, dissection, vasculitis or significant stenosis. Renals: Both renal arteries are patent without evidence of aneurysm, dissection, vasculitis, fibromuscular dysplasia or significant stenosis. IMA: Patent without evidence of aneurysm, dissection, vasculitis or significant stenosis. Inflow: Patent without evidence of aneurysm, dissection, vasculitis or significant stenosis. Atherosclerotic calcifications are present.  Proximal Outflow: There is occlusion of the superficial femoral arteries bilaterally. Bilateral common femoral and profunda femoral arteries are patent without evidence of aneurysm, dissection, vasculitis or significant stenosis. Veins: No obvious venous abnormality within the limitations of this arterial phase study. Review of the MIP images confirms the above findings. NON-VASCULAR Lower chest: There is some atelectasis in the right lung base. Hepatobiliary: No focal liver abnormality is seen. No gallstones, gallbladder wall thickening, or biliary dilatation. Pancreas: Unremarkable. No  pancreatic ductal dilatation or surrounding inflammatory changes. Spleen: Normal in size without focal abnormality. Adrenals/Urinary Tract: There is a 1.7 cm cyst in the left kidney. There is no hydronephrosis or perinephric fluid. The adrenal glands and bladder are within normal limits. Stomach/Bowel: There is no evidence for active gastrointestinal bleeding. There is no bowel obstruction, focal wall thickening or inflammation. The appendix is not seen. There is diffuse colonic diverticulosis without evidence for diverticulitis. Moderate-sized hiatal hernia is again seen. Lymphatic: No enlarged lymph nodes are seen. Reproductive: Uterus and bilateral adnexa are unremarkable. Other: Small right inguinal hernia containing nondilated bowel. Small fat containing left inguinal hernia. No ascites or free air. Musculoskeletal: No acute or significant osseous findings. IMPRESSION: VASCULAR 1. No evidence for superior mesenteric artery thrombosis. 2. 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 3. No evidence for aortic dissection or aneurysm. 4.  Aortic Atherosclerosis (ICD10-I70.0). NON-VASCULAR 1. No evidence for acute gastrointestinal hemorrhage. 2. Right inguinal hernia contains nondilated bowel. No bowel obstruction. 3. Diffuse colonic diverticulosis without evidence for diverticulitis. Large stool burden. 4. Moderate-sized hiatal hernia. Electronically Signed   By: Ronney Asters M.D.   On: 04/03/2022 21:59    Microbiology: No results found for this or any previous visit (from the past 240 hour(s)).   Labs: Basic Metabolic Panel: Recent Labs  Lab 04/03/22 1422 04/04/22 0028 04/04/22 0745 04/05/22 0454 04/06/22 0527  NA 136 137  --  135 139  K 4.6 4.0  --  3.8 4.0  CL 104 101  --  102 104  CO2 24 25  --  25 26  GLUCOSE 112* 99  --  88 97  BUN 23 19  --  13 21  CREATININE 0.73 0.81  --  0.79 1.07*  CALCIUM 9.0 9.2  --  9.1 9.2  MG  --   --  2.1 2.0 2.3  PHOS  --   --  3.8  4.2 4.6   Liver Function Tests: Recent Labs  Lab 04/03/22 1422 04/04/22 0028 04/05/22 0454 04/06/22 0527  AST '27 23 21 20  '$ ALT '24 23 20 20  '$ ALKPHOS 58 55 51 53  BILITOT 0.5 0.7 1.0 0.8  PROT 6.9 7.2 6.7 6.7  ALBUMIN 4.0 4.2 3.8 3.7   Recent Labs  Lab 04/03/22 1422  LIPASE 42   No results for input(s): "AMMONIA" in the last 168 hours. CBC: Recent Labs  Lab 04/03/22 1422 04/04/22 0028 04/04/22 0745 04/04/22 1430 04/05/22 0454 04/06/22 0527  WBC 7.2  --   --   --  7.9 7.7  NEUTROABS 5.1  --   --   --  4.9 4.6  HGB 13.0 13.8 13.6 12.5 13.0 12.5  HCT 39.4 42.0 40.9 38.3 39.8 38.5  MCV 96.8  --   --   --  97.5 96.7  PLT 237  --   --   --  201 210   Cardiac Enzymes: No results for input(s): "CKTOTAL", "CKMB", "CKMBINDEX", "TROPONINI" in the last 168 hours. BNP: BNP (last  3 results) No results for input(s): "BNP" in the last 8760 hours.  ProBNP (last 3 results) No results for input(s): "PROBNP" in the last 8760 hours.  CBG: No results for input(s): "GLUCAP" in the last 168 hours.     Signed:  Dia Crawford, MD [facility]

## 2022-04-26 DIAGNOSIS — I5032 Chronic diastolic (congestive) heart failure: Secondary | ICD-10-CM | POA: Diagnosis not present

## 2022-05-23 ENCOUNTER — Other Ambulatory Visit (HOSPITAL_BASED_OUTPATIENT_CLINIC_OR_DEPARTMENT_OTHER): Payer: Self-pay | Admitting: Cardiovascular Disease

## 2022-05-23 ENCOUNTER — Other Ambulatory Visit: Payer: Self-pay | Admitting: Cardiovascular Disease

## 2022-05-27 DIAGNOSIS — I5032 Chronic diastolic (congestive) heart failure: Secondary | ICD-10-CM | POA: Diagnosis not present

## 2022-05-31 NOTE — Progress Notes (Signed)
Cardiology Clinic Note   Patient Name: Amy [name] Date of Encounter: 06/03/2022  Primary Care Provider:  Antony Contras, MD Primary Cardiologist:  Evalina Field, MD  Patient Profile    Amy [name] 88 year old female presents to clinic today for follow-up evaluation of her essential hypertension, atrial fibrillation, and chronic systolic CHF.  Past Medical History    Past Medical History:  Diagnosis Date   Acute CHF (congestive heart failure) ([place]) 03/12/2021   Arrhythmia    CHF (congestive heart failure) ([place])    Hyperlipidemia    Hypertension    Rheumatic heart disease    Past Surgical History:  Procedure Laterality Date   APPENDECTOMY      Allergies  Allergies  Allergen Reactions   Lisinopril     Other reaction(s): cough   Oxycodone Nausea And Vomiting and Other (See Comments)    Intolerance--"its like I was floating up on the ceiling"     History of Present Illness    Amy [name] has a PMH of HTN, HLD, paroxysmal atrial fibrillation on Xarelto, chronic systolic CHF, and has a DNR order.  She was recently admitted to the hospital on 04/03/2022 with hematochezia.  She presented to the emergency department with reports of 1 day of bright red blood per rectum.  She denied history of GI bleeding.  She reported history of hemorrhoids.  Her Xarelto was held.  Her rectal bleeding resolved.  Her hemoglobin hematocrit were stable.  She was discharged 04/06/2022 and instructed to follow-up with her PCP if she began to experience GI bleeding symptoms.  She is instructed to follow-up with cardiology to discuss resuming Xarelto.  She presents to the clinic today for follow-up evaluation states she feels well today.  She does report a fall about 2 weeks ago where she tripped on her socks.  She reports with her son.  They feel that she is overall stable from a cardiac standpoint.  She has had no further bleeding issues.  We reviewed her recent hospitalization and expressed  understanding.  We also reviewed CHA2DS2-VASc score and they expressed understanding.  I will resume her Xarelto.  We will follow-up in 4 to 6 months.  I will give the salty 6 diet sheet and have her increase her physical activity as tolerated.   Today she denies chest pain, shortness of breath, lower extremity edema, fatigue, palpitations, melena, hematuria, hemoptysis, diaphoresis, weakness, presyncope, syncope, orthopnea, and PND.   Home Medications    Prior to Admission medications   Medication Sig Start Date End Date Taking? Authorizing Provider  amiodarone (PACERONE) 100 MG tablet Take 1 tablet (100 mg total) by mouth daily. 02/19/22   O'NealCassie Freer, MD  carvedilol (COREG) 6.25 MG tablet Take 1 tablet (6.25 mg total) by mouth 2 (two) times daily with a meal. 02/19/22   O'Neal, Cassie Freer, MD  losartan (COZAAR) 25 MG tablet Take 1 tablet (25 mg total) by mouth daily. 02/19/22   Geralynn Rile, MD  ondansetron (ZOFRAN) 4 MG tablet Take 1 tablet (4 mg total) by mouth every 6 (six) hours as needed for nausea. 04/06/22   Allie Bossier, MD  rosuvastatin (CRESTOR) 20 MG tablet Take 20 mg by mouth every evening. 02/21/21   [provider]  spironolactone (ALDACTONE) 25 MG tablet Take 0.5 tablets (12.5 mg total) by mouth daily. 02/19/22   O'Neal, Cassie Freer, MD    Family History    Family History  Problem Relation Age of Onset  Stroke Maternal Grandmother    She indicated that her mother is deceased. She indicated that her father is deceased. She indicated that her brother is alive. She indicated that the status of her maternal grandmother is unknown. She indicated that her daughter is alive. She indicated that only one of her three sons is alive.  Social History    Social History   Socioeconomic History   Marital status: Married    Spouse name: Not on file   Number of children: Not on file   Years of education: Not on file   Highest education level: Not on  file  Occupational History   Not on file  Tobacco Use   Smoking status: Former    Packs/day: 0.50    Years: 30.00    Total pack years: 15.00    Types: Cigarettes   Smokeless tobacco: Never  Substance and Sexual Activity   Alcohol use: No   Drug use: No   Sexual activity: Not on file  Other Topics Concern   Not on file  Social History Narrative   Not on file   Social Determinants of Health   Financial Resource Strain: Not on file  Food Insecurity: Not on file  Transportation Needs: Not on file  Physical Activity: Not on file  Stress: Not on file  Social Connections: Not on file  Intimate Partner Violence: Not on file     Review of Systems    General:  No chills, fever, night sweats or weight changes.  Cardiovascular:  No chest pain, dyspnea on exertion, edema, orthopnea, palpitations, paroxysmal nocturnal dyspnea. Dermatological: No rash, lesions/masses Respiratory: No cough, dyspnea Urologic: No hematuria, dysuria Abdominal:   No nausea, vomiting, diarrhea, bright red blood per rectum, melena, or hematemesis Neurologic:  No visual changes, wkns, changes in mental status. All other systems reviewed and are otherwise negative except as noted above.  Physical Exam    VS:  BP 132/68   Pulse 72   Resp 16   Ht '5\' 1"'$  (1.549 m)   Wt 132 lb 3.2 oz (60 kg)   BMI 24.98 kg/m  , BMI Body mass index is 24.98 kg/m. GEN: Well nourished, well developed, in no acute distress. HEENT: normal. Neck: Supple, no JVD, carotid bruits, or masses. Cardiac: RRR, no murmurs, rubs, or gallops. No clubbing, cyanosis, edema.  Radials/DP/PT 2+ and equal bilaterally.  Respiratory:  Respirations regular and unlabored, clear to auscultation bilaterally. GI: Soft, nontender, nondistended, BS + x 4. MS: no deformity or atrophy. Skin: warm and dry, no rash. Neuro:  Strength and sensation are intact. Psych: Normal affect.  Accessory Clinical Findings    Recent Labs: 02/19/2022: TSH  5.470 04/06/2022: ALT 20; BUN 21; Creatinine, Ser 1.07; Hemoglobin 12.5; Magnesium 2.3; Platelets 210; Potassium 4.0; Sodium 139   Recent Lipid Panel    Component Value Date/Time   CHOL 129 04/05/2022 0454   CHOL 143 03/13/2017 1147   TRIG 83 04/05/2022 0454   HDL 66 04/05/2022 0454   HDL 66 03/13/2017 1147   CHOLHDL 2.0 04/05/2022 0454   VLDL 17 04/05/2022 0454   LDLCALC 46 04/05/2022 0454   LDLCALC 40 03/13/2017 1147    ECG personally reviewed by me today-none today.  Echocardiogram 03/13/2021  IMPRESSIONS     1. Limited study Apical images incomplete (apical 2 and 3 chamber not  done). Overall, based on previous images LVEF is severely depressed at  approximately 20 to 25%. Left ventricular diastolic parameters are  indeterminate.  2. Right ventricular systolic function is moderately reduced. The right  ventricular size is normal.   3. Left atrial size was mildly dilated.   4. Right atrial size was mildly dilated.   5. The mitral valve is normal in structure. Mild mitral valve  regurgitation.   6. The aortic valve is normal in structure. Aortic valve regurgitation is  not visualized.   FINDINGS   Left Ventricle: The left ventricular internal cavity size was normal in  size. Limite studya. Apical images incomplete (apical 2 and 3 chamber  views not done. Overall, based on ther images LVEF is severely depressed  at appoximately 20 to 25%. There is no   left ventricular hypertrophy. Left ventricular diastolic parameters are  indeterminate.   Right Ventricle: The right ventricular size is normal. Right vetricular  wall thickness was not assessed. Right ventricular systolic function is  moderately reduced.   Left Atrium: Left atrial size was mildly dilated.   Right Atrium: Right atrial size was mildly dilated.   Pericardium: Trivial pericardial effusion is present.   Mitral Valve: The mitral valve is normal in structure. Mild mitral valve  regurgitation.    Tricuspid Valve: The tricuspid valve is normal in structure. Tricuspid  valve regurgitation is mild.   Aortic Valve: The aortic valve is normal in structure. Aortic valve  regurgitation is not visualized. Aortic valve mean gradient measures 4.0  mmHg. Aortic valve peak gradient measures 8.1 mmHg. Aortic valve area, by  VTI measures 0.99 cm.   Pulmonic Valve: The pulmonic valve was not well visualized. Pulmonic valve  regurgitation is not visualized.   Aorta: The aortic root is normal in size and structure.   Venous: The inferior vena cava was not well visualized.   IAS/Shunts: The interatrial septum was not assessed.   Assessment & Plan   1.  Chronic systolic CHF-weight stable.  Euvolemic today.  Reports compliance with medical therapy. Continue carvedilol, losartan, spironolactone Heart healthy low-sodium diet-salty 6 given Increase physical activity as tolerated  Paroxysmal atrial fibrillation-heart rate today 72 bpm.  Has remained rate controlled.  Recent episode of GI bleed.  Hemoglobin and hematocrit remained stable and she was discharged on 04/06/2022.  This year included by discussed risk versus benefits of resuming Xarelto.  CHA2DS2-VASc score 5 (7.2% risk of annual stroke) CHF, HTN, age 72, gender Avoid triggers caffeine, chocolate, EtOH, dehydration etc. Increase fiber in diet Continue carvedilol, amiodarone Restart Xarelto  Essential hypertension-BP today 132/68.  Well-controlled at home. Continue carvedilol, losartan Heart healthy low-sodium diet-salty 6 given Increase physical activity as tolerated  Hyperlipidemia-LDL 46 on 04/05/22 Continue rosuvastatin Heart healthy low-sodium high-fiber diet Increase physical activity as tolerated  Disposition: Follow-up with Dr. Audie Box in 3 to 4 months.   Jossie Ng. Perlita Forbush NP-C     06/03/2022, 3:40 PM [facility] [address] Office [phone] Fax [phone]  Notice: This  dictation was prepared with Dragon dictation along with smaller phrase technology. Any transcriptional errors that result from this process are unintentional and may not be corrected upon review.  I spent 14 minutes examining this patient, reviewing medications, and using patient centered shared decision making involving her cardiac care.  Prior to her visit I spent greater than 20 minutes reviewing her past medical history,  medications, and prior cardiac tests.

## 2022-06-03 ENCOUNTER — Ambulatory Visit: Payer: Medicare Other | Admitting: General Practice

## 2022-06-03 ENCOUNTER — Encounter: Payer: Self-pay | Admitting: General Practice

## 2022-06-03 VITALS — BP 132/68 | HR 72 | Resp 16 | Ht 61.0 in | Wt 132.2 lb

## 2022-06-03 DIAGNOSIS — I10 Essential (primary) hypertension: Secondary | ICD-10-CM | POA: Diagnosis not present

## 2022-06-03 DIAGNOSIS — E785 Hyperlipidemia, unspecified: Secondary | ICD-10-CM

## 2022-06-03 DIAGNOSIS — I480 Paroxysmal atrial fibrillation: Secondary | ICD-10-CM | POA: Diagnosis not present

## 2022-06-03 DIAGNOSIS — I5022 Chronic systolic (congestive) heart failure: Secondary | ICD-10-CM

## 2022-06-03 MED ORDER — RIVAROXABAN 15 MG PO TABS
15.0000 mg | ORAL_TABLET | Freq: Every day | ORAL | 6 refills | Status: DC
Start: 2022-06-03 — End: 2023-01-29

## 2022-06-03 NOTE — Patient Instructions (Addendum)
Medication Instructions:  RESTART XARELTO '15MG'$  DAILY  *If you need a refill on your cardiac medications before your next appointment, please call your pharmacy*  Lab Work:   Testing/Procedures:  NONE    NONE  If you have labs (blood work) drawn today and your tests are completely normal, you will receive your results only by: [facility] (if you have MyChart) OR  A paper copy in the mail If you have any lab test that is abnormal or we need to change your treatment, we will call you to review the results.  Special Instructions PLEASE READ AND FOLLOW SALTY 6-ATTACHED-1,'800mg'$  daily  Follow-Up: Your next appointment:  3-4 month(s) In Person with Evalina Field, MD    At [facility], you and your health needs are our priority.  As part of our continuing mission to provide you with exceptional heart care, we have created designated Provider Care Teams.  These Care Teams include your primary Cardiologist (physician) and Advanced Practice Providers (APPs -  Physician Assistants and Nurse Practitioners) who all work together to provide you with the care you need, when you need it.  Important Information About Sugar             6 SALTY THINGS TO AVOID     1,'800MG'$  DAILY

## 2022-08-21 ENCOUNTER — Ambulatory Visit: Payer: Medicare Other | Admitting: Cardiovascular Disease

## 2022-08-22 DIAGNOSIS — Z23 Encounter for immunization: Secondary | ICD-10-CM | POA: Diagnosis not present

## 2022-08-22 DIAGNOSIS — I5022 Chronic systolic (congestive) heart failure: Secondary | ICD-10-CM | POA: Diagnosis not present

## 2022-08-22 DIAGNOSIS — E7800 Pure hypercholesterolemia, unspecified: Secondary | ICD-10-CM | POA: Diagnosis not present

## 2022-08-22 DIAGNOSIS — Z9889 Other specified postprocedural states: Secondary | ICD-10-CM | POA: Diagnosis not present

## 2022-08-22 DIAGNOSIS — I480 Paroxysmal atrial fibrillation: Secondary | ICD-10-CM | POA: Diagnosis not present

## 2022-08-22 DIAGNOSIS — I10 Essential (primary) hypertension: Secondary | ICD-10-CM | POA: Diagnosis not present

## 2022-09-14 IMAGING — CT CTA GI BLEED
3 of 10 series · 11 of 46 positions shown, 17 images · IV contrast (agent unspecified)
Comparison: None Available.

CLINICAL DATA: Acute mesenteric ischemia. Abdominal pain and
bleeding.

EXAM:
CTA ABDOMEN AND PELVIS WITHOUT AND WITH CONTRAST
TECHNIQUE: Multidetector CT imaging of the abdomen and pelvis was performed
using the standard protocol during bolus administration of
intravenous contrast. Multiplanar reconstructed images and MIPs were
obtained and reviewed to evaluate the vascular anatomy.

[Series 6: axial arterial · axial · arterial · 0.78mm/px · z∈[-429,-301]mm · 4 of 193 slices shown]
[im 17/193  soft-tissue]
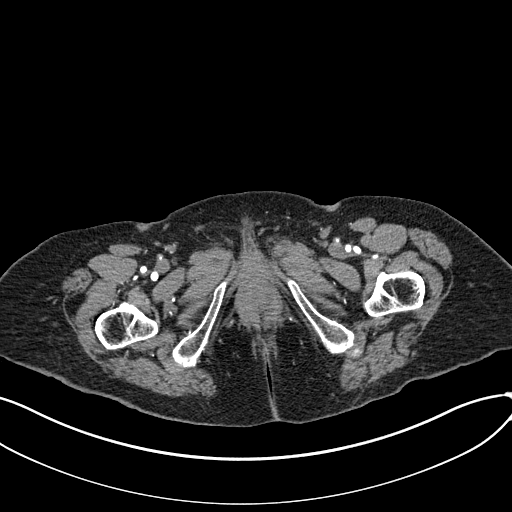
[im 49/193  soft-tissue]
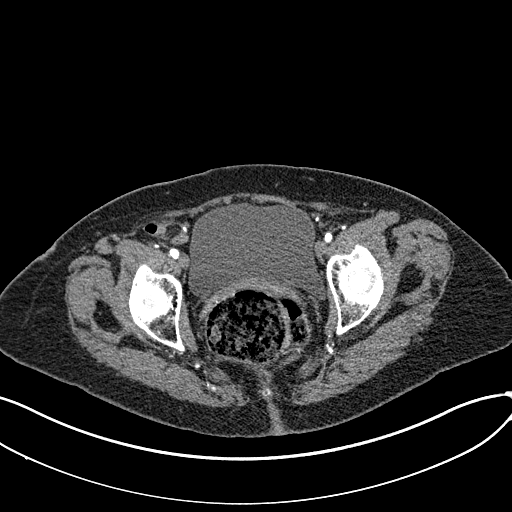
[im 65/193  soft-tissue]
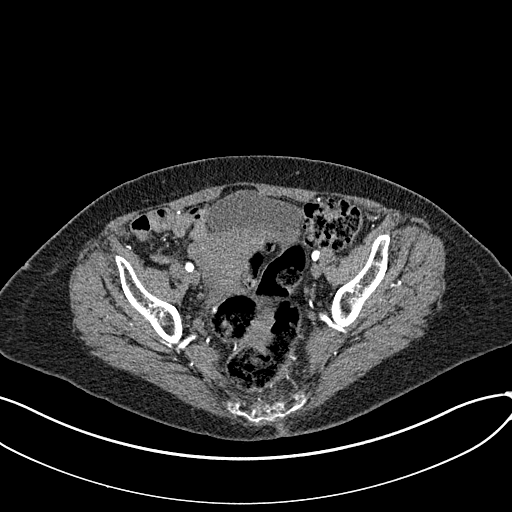
[im 81/193  soft-tissue]
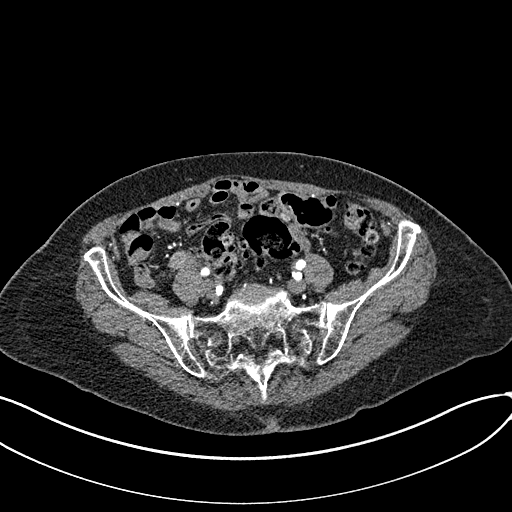

[Series 10: coronal arterial · coronal · arterial · 0.81mm/px · 3 of 144 slices shown, 4 images]
[im 36/144  soft-tissue]
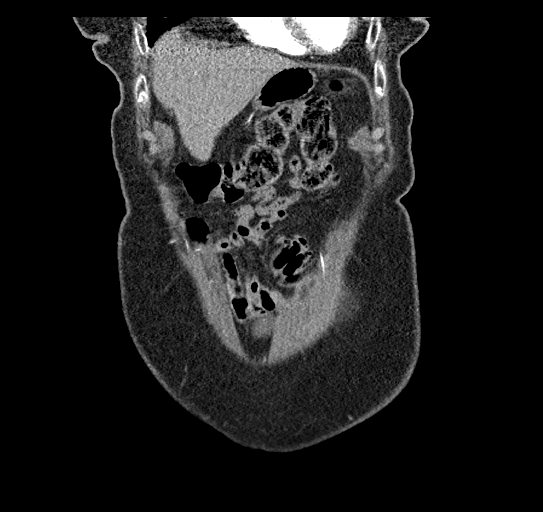
[im 72/144  soft-tissue]
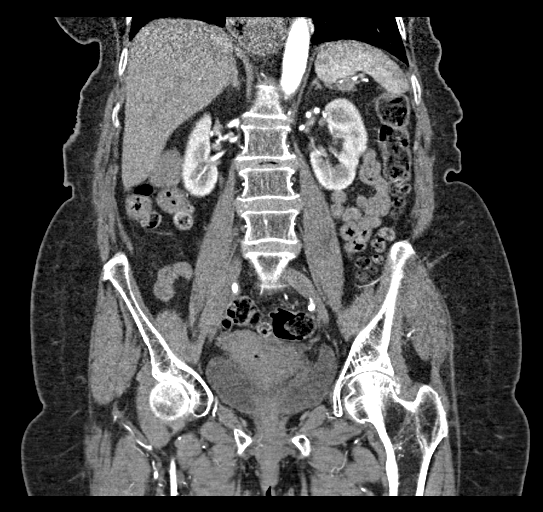
[im 72/144  bone]
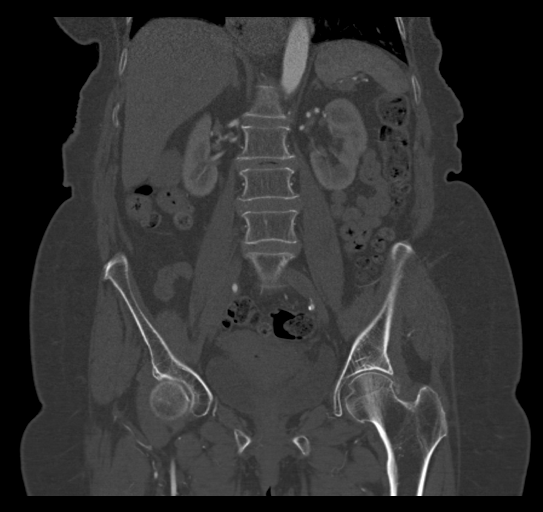
[im 108/144  soft-tissue]
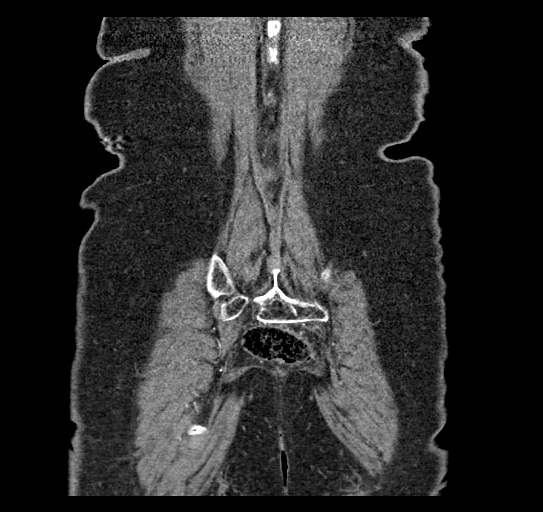

[Series 13: axial portal venous · axial · portal-venous · 0.78mm/px · z∈[-387,-157]mm · 4 of 78 slices shown, 9 images]
[im 16/78  soft-tissue]
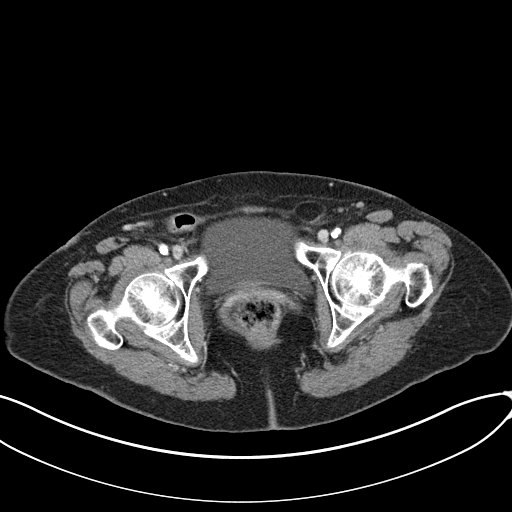
[im 16/78  lung]
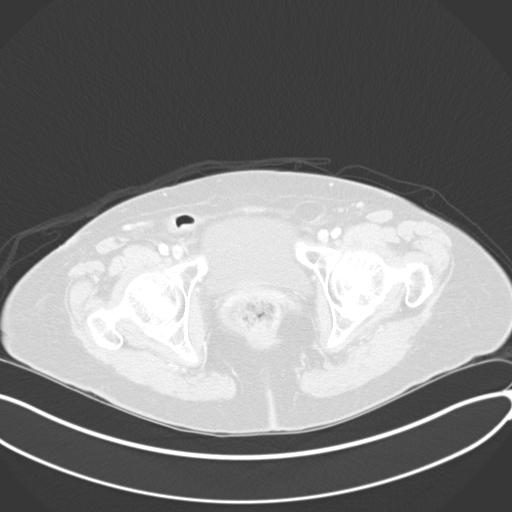
[im 16/78  bone]
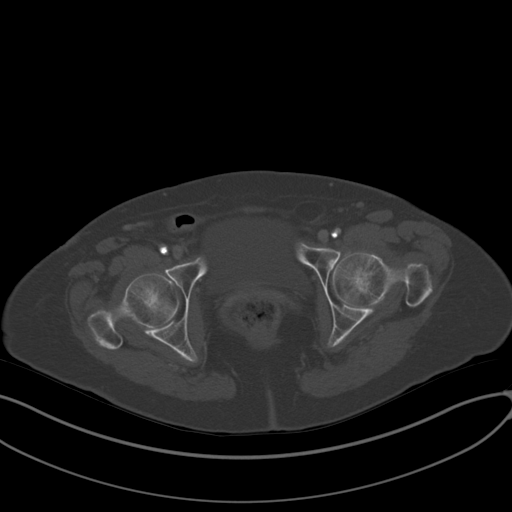
[im 31/78  soft-tissue]
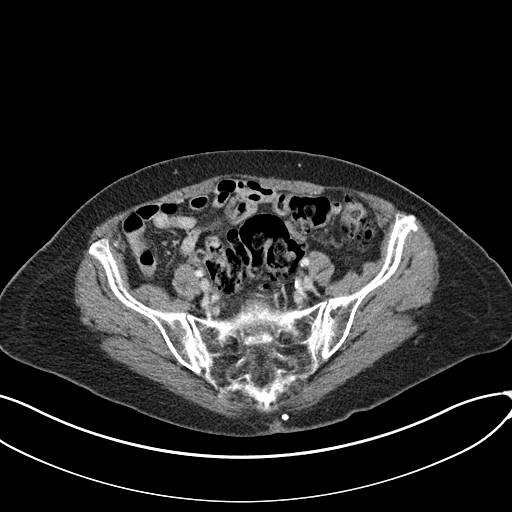
[im 31/78  lung]
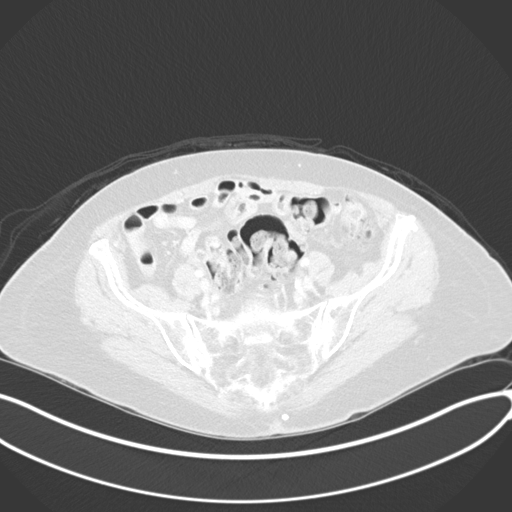
[im 47/78  soft-tissue]
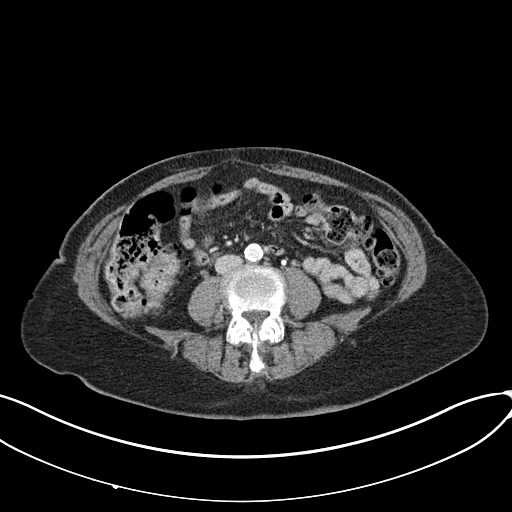
[im 47/78  lung]
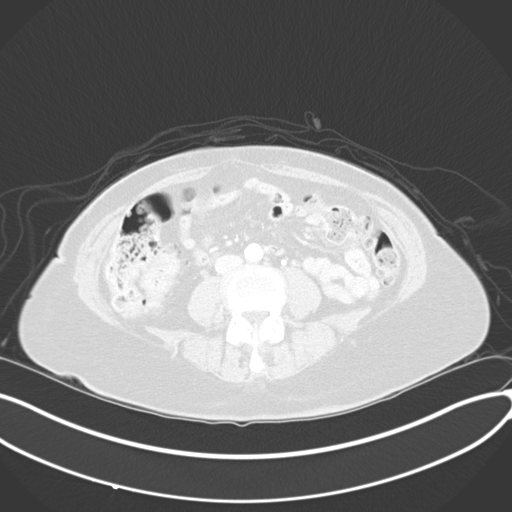
[im 62/78  soft-tissue]
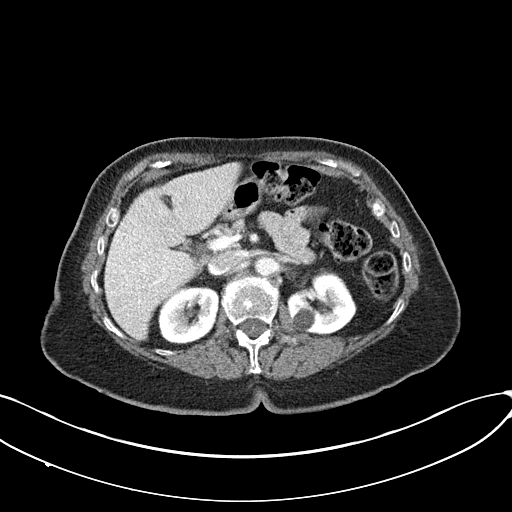
[im 62/78  lung]
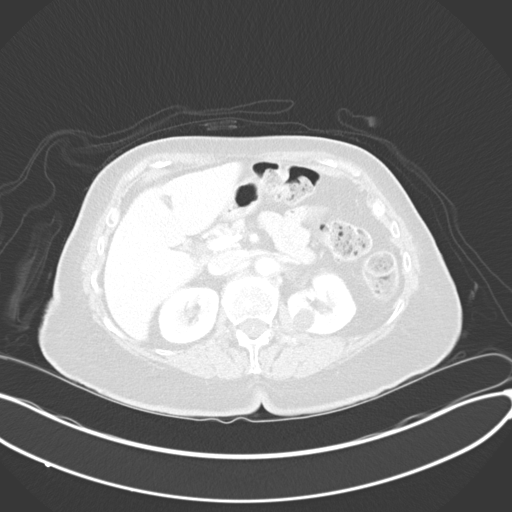

[11 of 46 positions shown; findings below may reference images not displayed]

RADIATION DOSE REDUCTION: This exam was performed according to the
departmental dose-optimization program which includes automated
exposure control, adjustment of the mA and/or kV according to
patient size and/or use of iterative reconstruction technique.

CONTRAST:  100mL OMNIPAQUE IOHEXOL 350 MG/ML SOLN
FINDINGS: VASCULAR

Aorta: Normal caliber aorta without aneurysm, dissection, vasculitis
or significant stenosis. There are atherosclerotic calcifications of
the aorta.

Celiac: Patent without evidence of aneurysm, dissection, vasculitis
or significant stenosis. Atherosclerotic calcifications are present
in the branch vessels.

SMA: Patent without evidence of aneurysm, dissection, vasculitis or
significant stenosis.

Renals: Both renal arteries are patent without evidence of aneurysm,
dissection, vasculitis, fibromuscular dysplasia or significant
stenosis.

IMA: Patent without evidence of aneurysm, dissection, vasculitis or
significant stenosis.

Inflow: Patent without evidence of aneurysm, dissection, vasculitis
or significant stenosis. Atherosclerotic calcifications are present.

Proximal Outflow: There is occlusion of the superficial femoral
arteries bilaterally. Bilateral common femoral and profunda femoral
arteries are patent without evidence of aneurysm, dissection,
vasculitis or significant stenosis.

Veins: No obvious venous abnormality within the limitations of this
arterial phase study.

Review of the MIP images confirms the above findings.

NON-VASCULAR

Lower chest: There is some atelectasis in the right lung base.

Hepatobiliary: No focal liver abnormality is seen. No gallstones,
gallbladder wall thickening, or biliary dilatation.

Pancreas: Unremarkable. No pancreatic ductal dilatation or
surrounding inflammatory changes.

Spleen: Normal in size without focal abnormality.

Adrenals/Urinary Tract: There is a 1.7 cm cyst in the left kidney.
There is no hydronephrosis or perinephric fluid. The adrenal glands
and bladder are within normal limits.

Stomach/Bowel: There is no evidence for active gastrointestinal
bleeding. There is no bowel obstruction, focal wall thickening or
inflammation. The appendix is not seen. There is diffuse colonic
diverticulosis without evidence for diverticulitis. Moderate-sized
hiatal hernia is again seen.

Lymphatic: No enlarged lymph nodes are seen.

Reproductive: Uterus and bilateral adnexa are unremarkable.

Other: Small right inguinal hernia containing nondilated bowel.
Small fat containing left inguinal hernia. No ascites or free air.

Musculoskeletal: No acute or significant osseous findings.
IMPRESSION: VASCULAR

1. No evidence for superior mesenteric artery thrombosis.
2. Occlusion of the bilateral visualized superficial femoral
arteries.
3. No evidence for aortic dissection or aneurysm.
4.  Aortic Atherosclerosis ([id number]).

NON-VASCULAR

1. No evidence for acute gastrointestinal hemorrhage.
2. Right inguinal hernia contains nondilated bowel. No bowel
obstruction.
3. Diffuse colonic diverticulosis without evidence for
diverticulitis. Large stool burden.
4. Moderate-sized hiatal hernia.

## 2022-10-07 NOTE — Progress Notes (Deleted)
Cardiology Office Note:   Date:  10/07/2022  NAME:  Amy [name]    MRN: [id number] DOB:  [birth year]   PCP:  Antony Contras, MD  Cardiologist:  Evalina Field, MD  Electrophysiologist:  None   Referring MD: Antony Contras, MD   No chief complaint on file. ***  History of Present Illness:   Amy [name] is a 88 y.o. female with a hx of CHF, pAF, LBBB who presents for follow-up. Admitted 03/2022 for GI bleed. No evaluation by GI. Xarelto started back in August.   Problem List Systolic HF -EF 51-88% (01/1659) 2. Paroxysmal Afib -CHADSVASC=5 (age, female, HTN) 3. LBBB -QRS 148 msec 4. First degree AV block (260 msec) 5. GI Bleed 03/2022  Past Medical History: Past Medical History:  Diagnosis Date   Acute CHF (congestive heart failure) ([place]) 03/12/2021   Arrhythmia    CHF (congestive heart failure) ([facility])    Hyperlipidemia    Hypertension    Rheumatic heart disease     Past Surgical History: Past Surgical History:  Procedure Laterality Date   APPENDECTOMY      Current Medications: No outpatient medications have been marked as taking for the 10/09/22 encounter (Appointment) with Geralynn Rile, MD.     Allergies:    Lisinopril and Oxycodone   Social History: Social History   Socioeconomic History   Marital status: Married    Spouse name: Not on file   Number of children: Not on file   Years of education: Not on file   Highest education level: Not on file  Occupational History   Not on file  Tobacco Use   Smoking status: Former    Packs/day: 0.50    Years: 30.00    Total pack years: 15.00    Types: Cigarettes   Smokeless tobacco: Never  Substance and Sexual Activity   Alcohol use: No   Drug use: No   Sexual activity: Not on file  Other Topics Concern   Not on file  Social History Narrative   Not on file   Social Determinants of Health   Financial Resource Strain: Not on file  Food Insecurity: Not on file  Transportation Needs: Not on file   Physical Activity: Not on file  Stress: Not on file  Social Connections: Not on file     Family History: The patient's ***family history includes Stroke in her maternal grandmother.  ROS:   All other ROS reviewed and negative. Pertinent positives noted in the HPI.     EKGs/Labs/Other Studies Reviewed:   The following studies were personally reviewed by me today:  EKG:  EKG is *** ordered today.  The ekg ordered today demonstrates ***, and was personally reviewed by me.   TTE 03/13/2021  1. Limited study Apical images incomplete (apical 2 and 3 chamber not  done). Overall, based on previous images LVEF is severely depressed at  approximately 20 to 25%. Left ventricular diastolic parameters are  indeterminate.   2. Right ventricular systolic function is moderately reduced. The right  ventricular size is normal.   3. Left atrial size was mildly dilated.   4. Right atrial size was mildly dilated.   5. The mitral valve is normal in structure. Mild mitral valve  regurgitation.   6. The aortic valve is normal in structure. Aortic valve regurgitation is  not visualized.   Recent Labs: 02/19/2022: TSH 5.470 04/06/2022: ALT 20; BUN 21; Creatinine, Ser 1.07; Hemoglobin 12.5; Magnesium 2.3; Platelets 210; Potassium 4.0; Sodium 139  Recent Lipid Panel    Component Value Date/Time   CHOL 129 04/05/2022 0454   CHOL 143 03/13/2017 1147   TRIG 83 04/05/2022 0454   HDL 66 04/05/2022 0454   HDL 66 03/13/2017 1147   CHOLHDL 2.0 04/05/2022 0454   VLDL 17 04/05/2022 0454   LDLCALC 46 04/05/2022 0454   LDLCALC 40 03/13/2017 1147    Physical Exam:   VS:  There were no vitals taken for this visit.   Wt Readings from Last 3 Encounters:  06/03/22 132 lb 3.2 oz (60 kg)  04/06/22 130 lb 1.1 oz (59 kg)  02/19/22 132 lb 9.6 oz (60.1 kg)    General: Well nourished, well developed, in no acute distress Head: Atraumatic, normal size  Eyes: PEERLA, EOMI  Neck: Supple, no JVD Endocrine: No  thryomegaly Cardiac: Normal S1, S2; RRR; no murmurs, rubs, or gallops Lungs: Clear to auscultation bilaterally, no wheezing, rhonchi or rales  Abd: Soft, nontender, no hepatomegaly  Ext: No edema, pulses 2+ Musculoskeletal: No deformities, BUE and BLE strength normal and equal Skin: Warm and dry, no rashes   Neuro: Alert and oriented to person, place, time, and situation, CNII-XII grossly intact, no focal deficits  Psych: Normal mood and affect   ASSESSMENT:   Amy [name] is a 88 y.o. female who presents for the following: No diagnosis found.  PLAN:   There are no diagnoses linked to this encounter.  {Are you ordering a CV Procedure (e.g. stress test, cath, DCCV, TEE, etc)?   Press F2        :[id number]}  Disposition: No follow-ups on file.  Medication Adjustments/Labs and Tests Ordered: Current medicines are reviewed at length with the patient today.  Concerns regarding medicines are outlined above.  No orders of the defined types were placed in this encounter.  No orders of the defined types were placed in this encounter.   There are no Patient Instructions on file for this visit.   Time Spent with Patient: I have spent a total of *** minutes with patient reviewing hospital notes, telemetry, EKGs, labs and examining the patient as well as establishing an assessment and plan that was discussed with the patient.  > 50% of time was spent in direct patient care.  Signed, Addison Naegeli. Audie Box, MD, [facility]  [address] [place], [place] [address] [phone]  10/07/2022 7:03 PM

## 2022-10-09 ENCOUNTER — Ambulatory Visit: Payer: Medicare Other | Attending: Cardiovascular Disease | Admitting: Cardiovascular Disease

## 2022-10-09 DIAGNOSIS — I480 Paroxysmal atrial fibrillation: Secondary | ICD-10-CM

## 2022-10-09 DIAGNOSIS — I10 Essential (primary) hypertension: Secondary | ICD-10-CM

## 2022-10-09 DIAGNOSIS — I5022 Chronic systolic (congestive) heart failure: Secondary | ICD-10-CM

## 2022-10-15 ENCOUNTER — Encounter: Payer: Self-pay | Admitting: Cardiovascular Disease

## 2022-10-31 DIAGNOSIS — Z634 Disappearance and death of family member: Secondary | ICD-10-CM | POA: Diagnosis not present

## 2022-10-31 DIAGNOSIS — I10 Essential (primary) hypertension: Secondary | ICD-10-CM | POA: Diagnosis not present

## 2022-11-26 ENCOUNTER — Ambulatory Visit: Payer: Medicare Other | Admitting: Nurse Practitioner

## 2022-11-26 NOTE — Progress Notes (Deleted)
Office Visit    Patient Name: Amy [name] Date of Encounter: 11/26/2022  Primary Care Provider:  Antony Contras, MD Primary Cardiologist:  Evalina Field, MD  Chief Complaint    89 year old female with a history of chronic systolic heart failure, paroxysmal atrial fibrillation on Xarelto, LBBB, rheumatic heart disease, hypertension, and hyperlipidemia who presents for follow-up related to heart failure and atrial fibrillation.  Past Medical History    Past Medical History:  Diagnosis Date   Acute CHF (congestive heart failure) ([place]) 03/12/2021   Arrhythmia    CHF (congestive heart failure) ([place])    Hyperlipidemia    Hypertension    Rheumatic heart disease    Past Surgical History:  Procedure Laterality Date   APPENDECTOMY      Allergies  Allergies  Allergen Reactions   Lisinopril     Other reaction(s): cough   Oxycodone Nausea And Vomiting and Other (See Comments)    Intolerance--"its like I was floating up on the ceiling"      Labs/Other Studies Reviewed    The following studies were reviewed today: Echocardiogram 03/13/2021   IMPRESSIONS   1. Limited study Apical images incomplete (apical 2 and 3 chamber not  done). Overall, based on previous images LVEF is severely depressed at  approximately 20 to 25%. Left ventricular diastolic parameters are  indeterminate.   2. Right ventricular systolic function is moderately reduced. The right  ventricular size is normal.   3. Left atrial size was mildly dilated.   4. Right atrial size was mildly dilated.   5. The mitral valve is normal in structure. Mild mitral valve  regurgitation.   6. The aortic valve is normal in structure. Aortic valve regurgitation is  not visualized.   Recent Labs: 02/19/2022: TSH 5.470 04/06/2022: ALT 20; BUN 21; Creatinine, Ser 1.07; Hemoglobin 12.5; Magnesium 2.3; Platelets 210; Potassium 4.0; Sodium 139  Recent Lipid Panel    Component Value Date/Time   CHOL 129 04/05/2022 0454    CHOL 143 03/13/2017 1147   TRIG 83 04/05/2022 0454   HDL 66 04/05/2022 0454   HDL 66 03/13/2017 1147   CHOLHDL 2.0 04/05/2022 0454   VLDL 17 04/05/2022 0454   LDLCALC 46 04/05/2022 0454   LDLCALC 40 03/13/2017 1147    History of Present Illness    89 year old female with the above past medical history including chronic systolic heart failure, paroxysmal atrial fibrillation on Xarelto, LBBB, rheumatic heart disease, hypertension, and hyperlipidemia.  Diagnosed with heart failure in May 2022.  Echocardiogram the time revealed EF 20 to 25%.  She opted for medical management.  She declined ICD, or further workup.  She did not tolerate Entresto in the setting of low BP.  She was hospitalized in 03/2022 in the setting of GI bleed.  Xarelto was held temporarily but later resumed.  She was last seen in the office on 06/03/2022 and was stable from a cardiac standpoint.  She presents today for follow-up.  Since her last visit  Chronic systolic heart failure: Paroxysmal atrial fibrillation: CBC, CMET, TSH LBBB: Hypertension: Hyperlipidemia: Disposition:  Home Medications    Current Outpatient Medications  Medication Sig Dispense Refill   amiodarone (PACERONE) 100 MG tablet Take 1 tablet (100 mg total) by mouth daily. 90 tablet 3   carvedilol (COREG) 6.25 MG tablet Take 1 tablet (6.25 mg total) by mouth 2 (two) times daily with a meal. 180 tablet 3   losartan (COZAAR) 25 MG tablet Take 1 tablet (25 mg total) by  mouth daily. 90 tablet 3   ondansetron (ZOFRAN) 4 MG tablet Take 1 tablet (4 mg total) by mouth every 6 (six) hours as needed for nausea. 20 tablet 0   Rivaroxaban (XARELTO) 15 MG TABS tablet Take 1 tablet (15 mg total) by mouth daily after supper. 30 tablet 6   spironolactone (ALDACTONE) 25 MG tablet Take 0.5 tablets (12.5 mg total) by mouth daily. 45 tablet 3   No current facility-administered medications for this visit.     Review of Systems    ***.  All other systems reviewed and  are otherwise negative except as noted above.    Physical Exam    VS:  There were no vitals taken for this visit. , BMI There is no height or weight on file to calculate BMI.     GEN: Well nourished, well developed, in no acute distress. HEENT: normal. Neck: Supple, no JVD, carotid bruits, or masses. Cardiac: RRR, no murmurs, rubs, or gallops. No clubbing, cyanosis, edema.  Radials/DP/PT 2+ and equal bilaterally.  Respiratory:  Respirations regular and unlabored, clear to auscultation bilaterally. GI: Soft, nontender, nondistended, BS + x 4. MS: no deformity or atrophy. Skin: warm and dry, no rash. Neuro:  Strength and sensation are intact. Psych: Normal affect.  Accessory Clinical Findings    ECG personally reviewed by me today - *** - no acute changes.   Lab Results  Component Value Date   WBC 7.7 04/06/2022   HGB 12.5 04/06/2022   HCT 38.5 04/06/2022   MCV 96.7 04/06/2022   PLT 210 04/06/2022   Lab Results  Component Value Date   CREATININE 1.07 (H) 04/06/2022   BUN 21 04/06/2022   NA 139 04/06/2022   K 4.0 04/06/2022   CL 104 04/06/2022   CO2 26 04/06/2022   Lab Results  Component Value Date   ALT 20 04/06/2022   AST 20 04/06/2022   ALKPHOS 53 04/06/2022   BILITOT 0.8 04/06/2022   Lab Results  Component Value Date   CHOL 129 04/05/2022   HDL 66 04/05/2022   LDLCALC 46 04/05/2022   TRIG 83 04/05/2022   CHOLHDL 2.0 04/05/2022    No results found for: "HGBA1C"  Assessment & Plan    1.  ***  No BP recorded.  {Refresh Note OR Click here to enter BP  :1}***   Lenna Sciara, NP 11/26/2022, 6:11 AM

## 2022-11-29 DIAGNOSIS — I5022 Chronic systolic (congestive) heart failure: Secondary | ICD-10-CM | POA: Diagnosis not present

## 2022-11-29 DIAGNOSIS — I110 Hypertensive heart disease with heart failure: Secondary | ICD-10-CM | POA: Diagnosis not present

## 2022-11-29 DIAGNOSIS — I480 Paroxysmal atrial fibrillation: Secondary | ICD-10-CM | POA: Diagnosis not present

## 2022-12-05 ENCOUNTER — Encounter (HOSPITAL_COMMUNITY): Payer: Self-pay | Admitting: *Deleted

## 2022-12-09 ENCOUNTER — Ambulatory Visit: Payer: Medicare Other | Admitting: Nurse Practitioner

## 2023-01-08 NOTE — Progress Notes (Deleted)
Cardiology Clinic Note   Patient Name: Amy [name] Date of Encounter: 01/08/2023  Primary Care Provider:  Antony Contras, MD Primary Cardiologist:  Evalina Field, MD  Patient Profile    89 year old female with history of hypertension, atrial fibrillation,CHA2DS2-VASc score 5 (7.2% risk of annual stroke, restarted on Xarelto, and chronic systolic CHF,EF 20 to 123456.  Past Medical History    Past Medical History:  Diagnosis Date   Acute CHF (congestive heart failure) ([facility]) 03/12/2021   Arrhythmia    CHF (congestive heart failure) ([facility])    Hyperlipidemia    Hypertension    Rheumatic heart disease    Past Surgical History:  Procedure Laterality Date   APPENDECTOMY      Allergies  Allergies  Allergen Reactions   Lisinopril     Other reaction(s): cough   Oxycodone Nausea And Vomiting and Other (See Comments)    Intolerance--"its like I was floating up on the ceiling"     History of Present Illness    ***  Home Medications    Current Outpatient Medications  Medication Sig Dispense Refill   amiodarone (PACERONE) 100 MG tablet Take 1 tablet (100 mg total) by mouth daily. 90 tablet 3   carvedilol (COREG) 6.25 MG tablet Take 1 tablet (6.25 mg total) by mouth 2 (two) times daily with a meal. 180 tablet 3   losartan (COZAAR) 25 MG tablet Take 1 tablet (25 mg total) by mouth daily. 90 tablet 3   ondansetron (ZOFRAN) 4 MG tablet Take 1 tablet (4 mg total) by mouth every 6 (six) hours as needed for nausea. 20 tablet 0   Rivaroxaban (XARELTO) 15 MG TABS tablet Take 1 tablet (15 mg total) by mouth daily after supper. 30 tablet 6   spironolactone (ALDACTONE) 25 MG tablet Take 0.5 tablets (12.5 mg total) by mouth daily. 45 tablet 3   No current facility-administered medications for this visit.     Family History    Family History  Problem Relation Age of Onset   Stroke Maternal Grandmother    She indicated that her mother is deceased. She indicated that her father is  deceased. She indicated that her brother is alive. She indicated that the status of her maternal grandmother is unknown. She indicated that her daughter is alive. She indicated that only one of her three sons is alive.  Social History    Social History   Socioeconomic History   Marital status: Married    Spouse name: Not on file   Number of children: Not on file   Years of education: Not on file   Highest education level: Not on file  Occupational History   Not on file  Tobacco Use   Smoking status: Former    Packs/day: 0.50    Years: 30.00    Total pack years: 15.00    Types: Cigarettes   Smokeless tobacco: Never  Substance and Sexual Activity   Alcohol use: No   Drug use: No   Sexual activity: Not on file  Other Topics Concern   Not on file  Social History Narrative   Not on file   Social Determinants of Health   Financial Resource Strain: Not on file  Food Insecurity: Not on file  Transportation Needs: Not on file  Physical Activity: Not on file  Stress: Not on file  Social Connections: Not on file  Intimate Partner Violence: Not on file     Review of Systems    General:  No  chills, fever, night sweats or weight changes.  Cardiovascular:  No chest pain, dyspnea on exertion, edema, orthopnea, palpitations, paroxysmal nocturnal dyspnea. Dermatological: No rash, lesions/masses Respiratory: No cough, dyspnea Urologic: No hematuria, dysuria Abdominal:   No nausea, vomiting, diarrhea, bright red blood per rectum, melena, or hematemesis Neurologic:  No visual changes, wkns, changes in mental status. All other systems reviewed and are otherwise negative except as noted above.     Physical Exam    VS:  There were no vitals taken for this visit. , BMI There is no height or weight on file to calculate BMI.     GEN: Well nourished, well developed, in no acute distress. HEENT: normal. Neck: Supple, no JVD, carotid bruits, or masses. Cardiac: RRR, no murmurs, rubs,  or gallops. No clubbing, cyanosis, edema.  Radials/DP/PT 2+ and equal bilaterally.  Respiratory:  Respirations regular and unlabored, clear to auscultation bilaterally. GI: Soft, nontender, nondistended, BS + x 4. MS: no deformity or atrophy. Skin: warm and dry, no rash. Neuro:  Strength and sensation are intact. Psych: Normal affect.  Accessory Clinical Findings    ECG personally reviewed by me today- *** - No acute changes  Lab Results  Component Value Date   WBC 7.7 04/06/2022   HGB 12.5 04/06/2022   HCT 38.5 04/06/2022   MCV 96.7 04/06/2022   PLT 210 04/06/2022   Lab Results  Component Value Date   CREATININE 1.07 (H) 04/06/2022   BUN 21 04/06/2022   NA 139 04/06/2022   K 4.0 04/06/2022   CL 104 04/06/2022   CO2 26 04/06/2022   Lab Results  Component Value Date   ALT 20 04/06/2022   AST 20 04/06/2022   ALKPHOS 53 04/06/2022   BILITOT 0.8 04/06/2022   Lab Results  Component Value Date   CHOL 129 04/05/2022   HDL 66 04/05/2022   LDLCALC 46 04/05/2022   TRIG 83 04/05/2022   CHOLHDL 2.0 04/05/2022    No results found for: "HGBA1C"  Review of Prior Studies:  1. Limited study Apical images incomplete (apical 2 and 3 chamber not  done). Overall, based on previous images LVEF is severely depressed at  approximately 20 to 25%. Left ventricular diastolic parameters are  indeterminate.   2. Right ventricular systolic function is moderately reduced. The right  ventricular size is normal.   3. Left atrial size was mildly dilated.   4. Right atrial size was mildly dilated.   5. The mitral valve is normal in structure. Mild mitral valve  regurgitation.   6. The aortic valve is normal in structure. Aortic valve regurgitation is  not visualized.   FINDINGS   Left Ventricle: The left ventricular internal cavity size was normal in  size. Limite studya. Apical images incomplete (apical 2 and 3 chamber  views not done. Overall, based on ther images LVEF is severely  depressed  at appoximately 20 to 25%. There is no   left ventricular hypertrophy. Left ventricular diastolic parameters are  indeterminate.   Right Ventricle: The right ventricular size is normal. Right vetricular  wall thickness was not assessed. Right ventricular systolic function is  moderately reduced.   Left Atrium: Left atrial size was mildly dilated.   Right Atrium: Right atrial size was mildly dilated.   Pericardium: Trivial pericardial effusion is present.   Mitral Valve: The mitral valve is normal in structure. Mild mitral valve  regurgitation.   Tricuspid Valve: The tricuspid valve is normal in structure. Tricuspid  valve regurgitation is  mild.   Aortic Valve: The aortic valve is normal in structure. Aortic valve  regurgitation is not visualized. Aortic valve mean gradient measures 4.0  mmHg. Aortic valve peak gradient measures 8.1 mmHg. Aortic valve area, by  VTI measures 0.99 cm.   Pulmonic Valve: The pulmonic valve was not well visualized. Pulmonic valve  regurgitation is not visualized.   Aorta: The aortic root is normal in size and structure.   Venous: The inferior vena cava was not well visualized.   IAS/Shunts: The interatrial septum was not assessed.  Assessment & Plan   1.  ***    Current medicines are reviewed at length with the patient today.  I have spent *** min's  dedicated to the care of this patient on the date of this encounter to include pre-visit review of records, assessment, management and diagnostic testing,with shared decision making. Signed, Phill Myron. West Pugh, ANP, [place]   01/08/2023 3:53 PM      Office [phone] Fax [phone]  Notice: This dictation was prepared with Dragon dictation along with smaller phrase technology. Any transcriptional errors that result from this process are unintentional and may not be corrected upon review.

## 2023-01-10 ENCOUNTER — Ambulatory Visit: Payer: Medicare Other | Admitting: Adult Health

## 2023-01-24 NOTE — Progress Notes (Unsigned)
Cardiology Clinic Note   Date: 01/29/2023 ID: Elisandra, Zetts September 06, 1933, MRN [id number]  Primary Cardiologist:  Evalina Field, MD  Patient Profile    Amy [name] is a 89 y.o. female who presents to the clinic today for follow-up.  Past medical history significant for: Chronic systolic heart failure. Echo 03/13/2021: EF 20 to 25%.  RV systolic function moderately reduced.  Mild BAE.  Mild MR. PAF. Hypertension. Hyperlipidemia. Lipid panel 04/05/2022: LDL 46, HDL 66, TG 83, total 129.   History of Present Illness    Amy [name] was first evaluated in the A-fib clinic by Roderic Palau, NP on 06/06/2020 for new onset A-fib at the request of Dr. Moreen Fowler.  A-fib was found at routine PCP visit and she was started on beta-blocker.  EKG at A-fib clinic was normal sinus rhythm.  Patient was started on dose adjusted Eliquis and continued on metoprolol.  Patient was evaluated by Dr. Audie Box for new onset systolic heart failure during hospitalization on 03/14/2021.  Patient presented to the ER on 03/12/2021 with complaints of a 3-week history of shortness of breath, orthopnea and bilateral lower extremity edema.  She was found to be in A-fib.  She reported poor tolerance to previously prescribed Eliquis and discontinued it on her own.  Patient was diuresed with IV Lasix and admitted for further evaluation and management.  During hospitalization she was started on Xarelto and is tolerating it well.  Echo showed EF 20 to 25% (details above) and cardiology was consulted.  Patient reported no cardiac awareness of A-fib.  Cardiomyopathy thought to be related to A-fib especially within the setting of new LBBB and significant LV dyssynchrony.  Dr. Audie Box discussed goals of care with patient and son given patient is a DNR.  She expressed desire for less aggressive measures agreeing to medications but reluctant for invasive cardiac procedures.  Rhythm control with amiodarone was recommended as well as titration of GDMT  for cardiomyopathy.  Patient was discharged on 03/15/2021.  Patient underwent hospital admission for bright red blood per rectum in June 2023.  She reported history of hemorrhoids.  Her Xarelto was held and bleeding resolved.  She was instructed to follow-up with cardiology to discuss resuming Xarelto.  She was last seen in the office by Coletta Memos, NP on 06/03/2022.  At that time she was overall stable with heart rate of 72 bpm and euvolemic.  She reported a mechanical fall 2 weeks prior to visit.  Her Xarelto was resumed.  Today, patient is accompanied by her son. Her biggest complaint today is feeling tremulous on and off throughout the day. Patient reports about 90 minutes after waking up she feels shaky and has to lay back down. Patient is tremulous upon entering the room but it seems to decrease the more she interacts. Patient's son feels there is an anxiety component contributing to this. Patient was started on Effexor and was doing well on it for a short period of time then developed confusion and tremors so it was stopped. He feels she is unsteady with position changes such as sit/laying down to stand. She ambulates with a walker for support. Patient tries to get exercise by doing laps around her home. Patient denies shortness of breath or dyspnea on exertion. No chest pain, pressure, or tightness. Denies lower extremity edema, orthopnea, or PND. Patient stopped taking Xarelto several months ago. Patient' son reports she had a near fall and they decided the risk of her taking Xarelto was too great and  they stopped it. Patient is unsure if she ever felt palpitations from her initial onset of afib. Discussed the risk of stroke without anticoagulation and both patient and son expressed understanding.    ROS: All other systems reviewed and are otherwise negative except as noted in History of Present Illness.  Studies Reviewed    ECG is not performed today.   Risk Assessment/Calculations      CHA2DS2-VASc Score = 5   This indicates a 7.2% annual risk of stroke. The patient's score is based upon: CHF History: 1 HTN History: 1 Diabetes History: 0 Stroke History: 0 Vascular Disease History: 0 Age Score: 2 Gender Score: 1           Patient and son have decided to stop Xarelto secondary to risk of falls.   Physical Exam    VS:  BP (!) 102/50   Pulse 82   Ht 5\' 2"  (1.575 m)   Wt 110 lb (49.9 kg)   BMI 20.12 kg/m  , BMI Body mass index is 20.12 kg/m.  GEN: Well nourished, well developed, in no acute distress. Neck: No JVD or carotid bruits. Cardiac:  RRR. No murmurs. No rubs or gallops.   Respiratory:  Respirations regular and unlabored. Clear to auscultation without rales, wheezing or rhonchi. GI: Soft, nontender, nondistended. Extremities: Radials/DP/PT 2+ and equal bilaterally. No clubbing or cyanosis. No edema.  Skin: Warm and dry, no rash. Neuro: Strength intact.  Assessment & Plan   Chronic systolic heart failure.  Likely related to A-fib.  Echo May 2022 with EF 20 to 25%.  Patient started on GDMT.  She denies DOE, lower extremity edema, abdominal fullness/bloating or early satiety. Euvolemic and well compensated on exam. Discussed repeat echo. Patient and son decline at this time, as it would not change anything for them at this point. Given her complaints of positional dizziness and soft BP today will reduce carvedilol to 3.125 mg daily. Continue losartan and spironolactone. PAF.  Onset August 2021.  She was initially started on Eliquis but could not tolerate.  During hospitalization for new onset systolic heart failure she was started on Xarelto.  She has no cardiac awareness of A-fib.  Patient denies palpitations or racing heart. Xarelto was stopped several months ago after a near fall. Patient and son are in agreement that the risk of her falling is too great for them to feel comfortable with her on a blood thinner. Discussed stroke risk without  anticoagulation and patient and son expressed understanding. Continue amiodarone and carvedilol (see #1). Hypertension.  BP today 102/50. Patient denies dizziness or headaches.  Continue carvedilol (see #1), losartan, spironolactone.  Disposition: Decrease carvedilol 3.125 mg daily. Return in 6 months or sooner as needed.          Signed, Justice Britain. Lindi Abram, DNP, NP-C

## 2023-01-29 ENCOUNTER — Encounter: Payer: Self-pay | Admitting: Student

## 2023-01-29 ENCOUNTER — Ambulatory Visit: Payer: Medicare Other | Attending: Cardiovascular Disease | Admitting: Student

## 2023-01-29 VITALS — BP 102/50 | HR 82 | Ht 62.0 in | Wt 110.0 lb

## 2023-01-29 DIAGNOSIS — I480 Paroxysmal atrial fibrillation: Secondary | ICD-10-CM | POA: Diagnosis not present

## 2023-01-29 DIAGNOSIS — I10 Essential (primary) hypertension: Secondary | ICD-10-CM | POA: Diagnosis not present

## 2023-01-29 DIAGNOSIS — I5022 Chronic systolic (congestive) heart failure: Secondary | ICD-10-CM | POA: Diagnosis not present

## 2023-01-29 MED ORDER — CARVEDILOL 6.25 MG PO TABS: 3.1250 mg | ORAL_TABLET | Freq: Two times a day (BID) | ORAL | 3 refills | Status: DC

## 2023-01-29 NOTE — Patient Instructions (Signed)
Medication Instructions:  Your physician has recommended you make the following change in your medication:  DECREASE: Carvedilol 3.125 twice daily.  *If you need a refill on your cardiac medications before your next appointment, please call your pharmacy*   Lab Work: NONE If you have labs (blood work) drawn today and your tests are completely normal, you will receive your results only by: [place] (if you have MyChart) OR A paper copy in the mail If you have any lab test that is abnormal or we need to change your treatment, we will call you to review the results.   Testing/Procedures: NONE   Follow-Up: At [facility], you and your health needs are our priority.  As part of our continuing mission to provide you with exceptional heart care, we have created designated Provider Care Teams.  These Care Teams include your primary Cardiologist (physician) and Advanced Practice Providers (APPs -  Physician Assistants and Nurse Practitioners) who all work together to provide you with the care you need, when you need it.  We recommend signing up for the patient portal called "MyChart".  Sign up information is provided on this After Visit Summary.  MyChart is used to connect with patients for Virtual Visits (Telemedicine).  Patients are able to view lab/test results, encounter notes, upcoming appointments, etc.  Non-urgent messages can be sent to your provider as well.   To learn more about what you can do with MyChart, go to [email or web address].    Your next appointment:   6 month(s)  Provider:   Evalina Field, MD

## 2023-02-17 ENCOUNTER — Other Ambulatory Visit: Payer: Self-pay | Admitting: Cardiovascular Disease

## 2023-02-17 NOTE — Telephone Encounter (Signed)
Rx request sent to pharmacy.  

## 2023-03-21 DIAGNOSIS — E7800 Pure hypercholesterolemia, unspecified: Secondary | ICD-10-CM | POA: Diagnosis not present

## 2023-03-21 DIAGNOSIS — I480 Paroxysmal atrial fibrillation: Secondary | ICD-10-CM | POA: Diagnosis not present

## 2023-03-21 DIAGNOSIS — I5022 Chronic systolic (congestive) heart failure: Secondary | ICD-10-CM | POA: Diagnosis not present

## 2023-03-21 DIAGNOSIS — I110 Hypertensive heart disease with heart failure: Secondary | ICD-10-CM | POA: Diagnosis not present

## 2023-05-17 ENCOUNTER — Other Ambulatory Visit: Payer: Self-pay

## 2023-05-17 ENCOUNTER — Emergency Department (HOSPITAL_COMMUNITY): Payer: Medicare Other

## 2023-05-17 ENCOUNTER — Encounter (HOSPITAL_COMMUNITY): Payer: Self-pay | Admitting: Emergency Medicine

## 2023-05-17 ENCOUNTER — Inpatient Hospital Stay (HOSPITAL_COMMUNITY)
Admission: EM | Admit: 2023-05-17 | Discharge: 2023-05-29 | DRG: 951 | Disposition: E | Payer: Medicare Other | Attending: Family Medicine | Admitting: Family Medicine

## 2023-05-17 DIAGNOSIS — R29810 Facial weakness: Secondary | ICD-10-CM | POA: Diagnosis not present

## 2023-05-17 DIAGNOSIS — Z888 Allergy status to other drugs, medicaments and biological substances status: Secondary | ICD-10-CM | POA: Diagnosis not present

## 2023-05-17 DIAGNOSIS — I480 Paroxysmal atrial fibrillation: Secondary | ICD-10-CM | POA: Diagnosis not present

## 2023-05-17 DIAGNOSIS — R451 Restlessness and agitation: Secondary | ICD-10-CM | POA: Diagnosis not present

## 2023-05-17 DIAGNOSIS — S0635AA Traumatic hemorrhage of left cerebrum with loss of consciousness status unknown, initial encounter: Secondary | ICD-10-CM

## 2023-05-17 DIAGNOSIS — G9341 Metabolic encephalopathy: Secondary | ICD-10-CM | POA: Diagnosis not present

## 2023-05-17 DIAGNOSIS — I10 Essential (primary) hypertension: Secondary | ICD-10-CM | POA: Diagnosis not present

## 2023-05-17 DIAGNOSIS — E785 Hyperlipidemia, unspecified: Secondary | ICD-10-CM | POA: Diagnosis present

## 2023-05-17 DIAGNOSIS — I69151 Hemiplegia and hemiparesis following nontraumatic intracerebral hemorrhage affecting right dominant side: Secondary | ICD-10-CM

## 2023-05-17 DIAGNOSIS — R0609 Other forms of dyspnea: Secondary | ICD-10-CM | POA: Diagnosis not present

## 2023-05-17 DIAGNOSIS — I5022 Chronic systolic (congestive) heart failure: Secondary | ICD-10-CM | POA: Diagnosis not present

## 2023-05-17 DIAGNOSIS — Z87891 Personal history of nicotine dependence: Secondary | ICD-10-CM | POA: Diagnosis not present

## 2023-05-17 DIAGNOSIS — I611 Nontraumatic intracerebral hemorrhage in hemisphere, cortical: Secondary | ICD-10-CM

## 2023-05-17 DIAGNOSIS — R29725 NIHSS score 25: Secondary | ICD-10-CM | POA: Diagnosis present

## 2023-05-17 DIAGNOSIS — Z8679 Personal history of other diseases of the circulatory system: Secondary | ICD-10-CM

## 2023-05-17 DIAGNOSIS — G9389 Other specified disorders of brain: Secondary | ICD-10-CM | POA: Diagnosis not present

## 2023-05-17 DIAGNOSIS — J9601 Acute respiratory failure with hypoxia: Secondary | ICD-10-CM | POA: Diagnosis not present

## 2023-05-17 DIAGNOSIS — Z823 Family history of stroke: Secondary | ICD-10-CM

## 2023-05-17 DIAGNOSIS — I619 Nontraumatic intracerebral hemorrhage, unspecified: Secondary | ICD-10-CM | POA: Diagnosis not present

## 2023-05-17 DIAGNOSIS — R404 Transient alteration of awareness: Secondary | ICD-10-CM | POA: Diagnosis not present

## 2023-05-17 DIAGNOSIS — Z885 Allergy status to narcotic agent status: Secondary | ICD-10-CM

## 2023-05-17 DIAGNOSIS — I110 Hypertensive heart disease with heart failure: Secondary | ICD-10-CM | POA: Diagnosis present

## 2023-05-17 DIAGNOSIS — Z79899 Other long term (current) drug therapy: Secondary | ICD-10-CM | POA: Diagnosis not present

## 2023-05-17 DIAGNOSIS — Z515 Encounter for palliative care: Principal | ICD-10-CM

## 2023-05-17 DIAGNOSIS — I629 Nontraumatic intracranial hemorrhage, unspecified: Secondary | ICD-10-CM | POA: Diagnosis not present

## 2023-05-17 DIAGNOSIS — S06359A Traumatic hemorrhage of left cerebrum with loss of consciousness of unspecified duration, initial encounter: Secondary | ICD-10-CM | POA: Diagnosis not present

## 2023-05-17 DIAGNOSIS — G936 Cerebral edema: Secondary | ICD-10-CM | POA: Diagnosis not present

## 2023-05-17 DIAGNOSIS — Z66 Do not resuscitate: Secondary | ICD-10-CM | POA: Diagnosis present

## 2023-05-17 DIAGNOSIS — Z9049 Acquired absence of other specified parts of digestive tract: Secondary | ICD-10-CM

## 2023-05-17 DIAGNOSIS — R6889 Other general symptoms and signs: Secondary | ICD-10-CM | POA: Diagnosis not present

## 2023-05-17 DIAGNOSIS — S0003XA Contusion of scalp, initial encounter: Secondary | ICD-10-CM | POA: Diagnosis present

## 2023-05-17 DIAGNOSIS — W19XXXA Unspecified fall, initial encounter: Secondary | ICD-10-CM | POA: Diagnosis present

## 2023-05-17 DIAGNOSIS — G935 Compression of brain: Secondary | ICD-10-CM | POA: Diagnosis present

## 2023-05-17 DIAGNOSIS — Z7189 Other specified counseling: Secondary | ICD-10-CM | POA: Diagnosis not present

## 2023-05-17 DIAGNOSIS — R0902 Hypoxemia: Secondary | ICD-10-CM | POA: Diagnosis not present

## 2023-05-17 LAB — I-STAT CHEM 8, ED
BUN: 25 mg/dL — ABNORMAL HIGH (ref 8–23)
Calcium, Ion: 1.09 mmol/L — ABNORMAL LOW (ref 1.15–1.40)
Chloride: 102 mmol/L (ref 98–111)
Creatinine, Ser: 1.1 mg/dL — ABNORMAL HIGH (ref 0.44–1.00)
Glucose, Bld: 174 mg/dL — ABNORMAL HIGH (ref 70–99)
HCT: 46 % (ref 36.0–46.0)
Hemoglobin: 15.6 g/dL — ABNORMAL HIGH (ref 12.0–15.0)
Potassium: 4.7 mmol/L (ref 3.5–5.1)
Sodium: 135 mmol/L (ref 135–145)
TCO2: 21 mmol/L — ABNORMAL LOW (ref 22–32)

## 2023-05-17 LAB — DIFFERENTIAL
Abs Immature Granulocytes: 0 10*3/uL (ref 0.00–0.07)
Basophils Absolute: 0 10*3/uL (ref 0.0–0.1)
Basophils Relative: 0 %
Eosinophils Absolute: 0.2 10*3/uL (ref 0.0–0.5)
Eosinophils Relative: 2 %
Lymphocytes Relative: 49 %
Lymphs Abs: 5.7 10*3/uL — ABNORMAL HIGH (ref 0.7–4.0)
Monocytes Absolute: 1.1 10*3/uL — ABNORMAL HIGH (ref 0.1–1.0)
Monocytes Relative: 9 %
Neutro Abs: 4.7 10*3/uL (ref 1.7–7.7)
Neutrophils Relative %: 40 %
nRBC: 0 /100 WBC

## 2023-05-17 LAB — CBC
HCT: 45.6 % (ref 36.0–46.0)
Hemoglobin: 14.9 g/dL (ref 12.0–15.0)
MCH: 30.9 pg (ref 26.0–34.0)
MCHC: 32.7 g/dL (ref 30.0–36.0)
MCV: 94.6 fL (ref 80.0–100.0)
Platelets: 276 K/uL (ref 150–400)
RBC: 4.82 MIL/uL (ref 3.87–5.11)
RDW: 13.6 % (ref 11.5–15.5)
WBC: 11.7 K/uL — ABNORMAL HIGH (ref 4.0–10.5)
nRBC: 0 % (ref 0.0–0.2)

## 2023-05-17 LAB — COMPREHENSIVE METABOLIC PANEL
ALT: 29 U/L (ref 0–44)
AST: 42 U/L — ABNORMAL HIGH (ref 15–41)
Albumin: 4.5 g/dL (ref 3.5–5.0)
Alkaline Phosphatase: 95 U/L (ref 38–126)
Anion gap: 18 — ABNORMAL HIGH (ref 5–15)
BUN: 22 mg/dL (ref 8–23)
CO2: 20 mmol/L — ABNORMAL LOW (ref 22–32)
Calcium: 9.9 mg/dL (ref 8.9–10.3)
Chloride: 97 mmol/L — ABNORMAL LOW (ref 98–111)
Creatinine, Ser: 1.17 mg/dL — ABNORMAL HIGH (ref 0.44–1.00)
GFR, Estimated: 45 mL/min — ABNORMAL LOW (ref >60)
Glucose, Bld: 172 mg/dL — ABNORMAL HIGH (ref 70–99)
Potassium: 4.8 mmol/L (ref 3.5–5.1)
Sodium: 135 mmol/L (ref 135–145)
Total Bilirubin: 0.5 mg/dL (ref 0.3–1.2)
Total Protein: 8 g/dL (ref 6.5–8.1)

## 2023-05-17 LAB — APTT: aPTT: 30 s (ref 24–36)

## 2023-05-17 LAB — CBG MONITORING, ED: Glucose-Capillary: 171 mg/dL — ABNORMAL HIGH (ref 70–99)

## 2023-05-17 LAB — PROTIME-INR
INR: 0.9 (ref 0.8–1.2)
Prothrombin Time: 12.6 seconds (ref 11.4–15.2)

## 2023-05-17 LAB — ETHANOL: Alcohol, Ethyl (B): <10 mg/dL

## 2023-05-17 MED ORDER — SODIUM CHLORIDE 0.9 % IV SOLN: INTRAVENOUS | Status: DC

## 2023-05-17 MED ORDER — ALBUTEROL SULFATE (2.5 MG/3ML) 0.083% IN NEBU: 2.5000 mg | INHALATION_SOLUTION | RESPIRATORY_TRACT | Status: DC | PRN

## 2023-05-17 MED ORDER — GLYCOPYRROLATE 0.2 MG/ML IJ SOLN
0.2000 mg | INTRAMUSCULAR | Status: DC | PRN
  Administered 2023-05-17 – 2023-05-18 (×2): 0.2 mg via INTRAVENOUS
  Filled 2023-05-17 (×2): qty 1

## 2023-05-17 MED ORDER — IPRATROPIUM-ALBUTEROL 0.5-2.5 (3) MG/3ML IN SOLN
3.0000 mL | Freq: Four times a day (QID) | RESPIRATORY_TRACT | Status: DC
  Filled 2023-05-17: qty 3

## 2023-05-17 MED ORDER — ONDANSETRON HCL 4 MG/2ML IJ SOLN: 4.0000 mg | Freq: Four times a day (QID) | INTRAMUSCULAR | Status: DC | PRN

## 2023-05-17 MED ORDER — GLYCOPYRROLATE 1 MG PO TABS: 1.0000 mg | ORAL_TABLET | ORAL | Status: DC | PRN

## 2023-05-17 MED ORDER — ONDANSETRON HCL 4 MG/2ML IJ SOLN
INTRAMUSCULAR | Status: AC
  Filled 2023-05-17: qty 2

## 2023-05-17 MED ORDER — ACETAMINOPHEN 650 MG RE SUPP: 650.0000 mg | Freq: Four times a day (QID) | RECTAL | Status: DC | PRN

## 2023-05-17 MED ORDER — GLYCOPYRROLATE 0.2 MG/ML IJ SOLN: 0.2000 mg | INTRAMUSCULAR | Status: DC | PRN

## 2023-05-17 MED ORDER — ACETAMINOPHEN 325 MG PO TABS: 650.0000 mg | ORAL_TABLET | Freq: Four times a day (QID) | ORAL | Status: DC | PRN

## 2023-05-17 MED ORDER — MIDAZOLAM HCL 2 MG/2ML IJ SOLN: 2.0000 mg | INTRAMUSCULAR | Status: DC | PRN

## 2023-05-17 MED ORDER — ONDANSETRON HCL 4 MG/2ML IJ SOLN
4.0000 mg | Freq: Once | INTRAMUSCULAR | Status: AC
  Administered 2023-05-17: 4 mg via INTRAVENOUS

## 2023-05-17 MED ORDER — POLYVINYL ALCOHOL 1.4 % OP SOLN: 1.0000 [drp] | Freq: Four times a day (QID) | OPHTHALMIC | Status: DC | PRN

## 2023-05-17 MED ORDER — DIPHENHYDRAMINE HCL 50 MG/ML IJ SOLN: 25.0000 mg | INTRAMUSCULAR | Status: DC | PRN

## 2023-05-17 MED ORDER — ONDANSETRON 4 MG PO TBDP: 4.0000 mg | ORAL_TABLET | Freq: Four times a day (QID) | ORAL | Status: DC | PRN

## 2023-05-17 MED ORDER — MORPHINE SULFATE (PF) 2 MG/ML IV SOLN
2.0000 mg | INTRAVENOUS | Status: DC | PRN
  Administered 2023-05-17: 2 mg via INTRAVENOUS
  Administered 2023-05-18: 4 mg via INTRAVENOUS
  Administered 2023-05-18 (×2): 2 mg via INTRAVENOUS
  Administered 2023-05-19: 4 mg via INTRAVENOUS
  Administered 2023-05-19: 2 mg via INTRAVENOUS
  Filled 2023-05-17: qty 2
  Filled 2023-05-17: qty 1
  Filled 2023-05-17: qty 2
  Filled 2023-05-17 (×3): qty 1

## 2023-05-17 MED ORDER — SODIUM CHLORIDE 0.9% FLUSH: 3.0000 mL | Freq: Once | INTRAVENOUS | Status: DC

## 2023-05-17 NOTE — Progress Notes (Signed)
   04/29/2023 2042  Spiritual Encounters  Type of Visit Initial  Care provided to: Pt and family  Referral source Trauma page  Reason for visit Trauma  OnCall Visit Yes  Advance Directives (For Healthcare)  Does Patient Have a Medical Advance Directive? Yes  Type of Advance Directive Out of facility DNR (pink MOST or yellow form)  Out of facility DNR (pink MOST or yellow form) in Chart? (Ambulatory ONLY) No - copy requested  Mental Health Advance Directives  Does Patient Have a Mental Health Advance Directive? No  Would patient like information on creating a mental health advance directive? No - Patient declined   Ch responded to trauma call. Pt's family was at bedside. Ch provided hospitality and support for family and staff. No follow-up needed at this time.

## 2023-05-17 NOTE — Code Documentation (Signed)
Responded to Code Stroke called at 1947 for R sided weakness, R facial droop, slurred speech, and confusion, LSN-1700. Pt arrived at 2004, CBG-171, NIH-25. CTH-hemorrhage. Once pt placed from CT scanner to stretcher, posturing of B arms noted and breathing labored/congested. Pt transported on NRB mask to ED room. Pt made DNR/Comfort care.

## 2023-05-17 NOTE — ED Notes (Signed)
  Comfort care orders implemented.  Son at bedside with patient.

## 2023-05-17 NOTE — ED Provider Notes (Signed)
[place] EMERGENCY DEPARTMENT AT [facility] Provider Note  CSN: [id number] Arrival date & time: 04/30/2023 2004  Chief Complaint(s) Code Stroke  HPI Amy [name] is a 89 y.o. female history of CHF, A-fib, hyperlipidemia, hypertension, on Xarelto presenting with abnormal mental status.  Patient was last seen by neighbor normal around 5 PM.  Son found patient at home nonresponsive and called paramedics.  Paramedics called code stroke as they felt patient had focal weakness on the right side.  On arrival, patient unable to provide any history.  Paramedics also note that patient initially had normal pulse ox but then developed mild respiratory distress with wet lung sounds and had to be placed on oxygen  History is not obtainable due to altered mental status.   Past Medical History Past Medical History:  Diagnosis Date   Acute CHF (congestive heart failure) (HCC) 03/12/2021   Arrhythmia    CHF (congestive heart failure) (HCC)    Hyperlipidemia    Hypertension    Rheumatic heart disease    Patient Active Problem List   Diagnosis Date Noted   Acute lower GI bleeding 04/03/2022   Chronic anticoagulation - on Xarelto for afib 04/03/2022   DNR (do not resuscitate)/DNI(Do Not Intubate) 04/03/2022   Chronic systolic CHF (congestive heart failure) (HCC) - LVEF 20-25% 03/13/2021   Benign essential HTN    Hyperlipidemia    Paroxysmal atrial fibrillation (HCC)    Home Medication(s) Prior to Admission medications   Medication Sig Start Date End Date Taking? Authorizing Provider  amiodarone (PACERONE) 100 MG tablet TAKE ONE TABLET BY MOUTH EVERY MORNING 02/17/23   O'Neal, Ronnald Ramp, MD  buPROPion (WELLBUTRIN XL) 150 MG 24 hr tablet Take 150 mg by mouth every morning. 01/28/23   [provider]  carvedilol (COREG) 6.25 MG tablet TAKE ONE TABLET BY MOUTH TWICE DAILY WITH A MEAL 02/17/23   O'Neal, Ronnald Ramp, MD  losartan (COZAAR) 25 MG tablet TAKE ONE TABLET BY MOUTH EVERY  MORNING 02/17/23   O'Neal, Ronnald Ramp, MD  rosuvastatin (CRESTOR) 20 MG tablet Take 20 mg by mouth at bedtime. 01/28/23   [provider]  spironolactone (ALDACTONE) 25 MG tablet TAKE 1/2 TABLET BY MOUTH EVERY MORNING 02/17/23   O'Neal, Ronnald Ramp, MD                                                                                                                                    Past Surgical History Past Surgical History:  Procedure Laterality Date   APPENDECTOMY     Family History Family History  Problem Relation Age of Onset   Stroke Maternal Grandmother     Social History Social History   Tobacco Use   Smoking status: Former    Current packs/day: 0.50    Average packs/day: 0.5 packs/day for 30.0 years (15.0 ttl pk-yrs)    Types: Cigarettes   Smokeless tobacco: Never  Substance Use Topics  Alcohol use: No   Drug use: No   Allergies Lisinopril and Oxycodone  Review of Systems Review of Systems  Unable to perform ROS: Acuity of condition    Physical Exam Vital Signs  I have reviewed the triage vital signs BP (!) 139/97 (BP Location: Right Arm)   Pulse 87   Temp 97.8 F (36.6 C) (Temporal)   Resp (!) 30   Ht 5\' 2"  (1.575 m)   Wt 57.6 kg   SpO2 94%   BMI 23.23 kg/m  Physical Exam Vitals and nursing note reviewed.  Constitutional:      General: She is in acute distress.     Appearance: She is well-developed. She is ill-appearing and diaphoretic.  HENT:     Head: Normocephalic.     Mouth/Throat:     Mouth: Mucous membranes are dry.  Eyes:     Conjunctiva/sclera: Conjunctivae normal.  Cardiovascular:     Rate and Rhythm: Normal rate and regular rhythm.     Heart sounds: No murmur heard. Pulmonary:     Effort: Respiratory distress present.     Breath sounds: Rales present.  Abdominal:     General: Abdomen is flat.     Palpations: Abdomen is soft.     Tenderness: There is no abdominal tenderness.  Musculoskeletal:     Right lower leg: No  edema.     Left lower leg: No edema.  Skin:    General: Skin is warm.     Capillary Refill: Capillary refill takes less than 2 seconds.  Neurological:     Comments: Obtunded, no spontaneous movements other than respiration, not moving extremities or following commands  Psychiatric:     Comments: Unable to assess     ED Results and Treatments Labs (all labs ordered are listed, but only abnormal results are displayed) Labs Reviewed  CBC - Abnormal; Notable for the following components:      Result Value   WBC 11.7 (*)    All other components within normal limits  DIFFERENTIAL - Abnormal; Notable for the following components:   Lymphs Abs 5.7 (*)    Monocytes Absolute 1.1 (*)    All other components within normal limits  COMPREHENSIVE METABOLIC PANEL - Abnormal; Notable for the following components:   Chloride 97 (*)    CO2 20 (*)    Glucose, Bld 172 (*)    Creatinine, Ser 1.17 (*)    AST 42 (*)    GFR, Estimated 45 (*)    Anion gap 18 (*)    All other components within normal limits  I-STAT CHEM 8, ED - Abnormal; Notable for the following components:   BUN 25 (*)    Creatinine, Ser 1.10 (*)    Glucose, Bld 174 (*)    Calcium, Ion 1.09 (*)    TCO2 21 (*)    Hemoglobin 15.6 (*)    All other components within normal limits  CBG MONITORING, ED - Abnormal; Notable for the following components:   Glucose-Capillary 171 (*)    All other components within normal limits  PROTIME-INR  APTT  ETHANOL  Radiology CT HEAD CODE STROKE WO CONTRAST  Result Date: 05/05/2023 CLINICAL DATA:  Code stroke. Initial evaluation for neuro deficit, stroke. EXAM: CT HEAD WITHOUT CONTRAST TECHNIQUE: Contiguous axial images were obtained from the base of the skull through the vertex without intravenous contrast. RADIATION DOSE REDUCTION: This exam was performed according to the  departmental dose-optimization program which includes automated exposure control, adjustment of the mA and/or kV according to patient size and/or use of iterative reconstruction technique. COMPARISON:  Prior study from 05/15/2018 FINDINGS: Brain: Massive acute intraparenchymal hemorrhage involving the left cerebral hemisphere is seen. Bleed measures approximately 4.8 x 9.4 x 5.2 cm (estimated volume 117 mL). Surrounding vasogenic edema with regional mass effect. Left lateral ventricle is effaced. Associated up to 1.4 cm of left-to-right shift. Asymmetric dilatation of the right lateral ventricle concerning for trapping. Secondary basilar cistern crowding without transtentorial herniation. Possible trace intraventricular extension with blood in the compressed third ventricle versus an additional separate but small bleed at the left thalamus noted (series 2, image 17). An additional small 5 mm parenchymal hemorrhage noted at the right parietal convexity (series 2, image 22). No other acute large vessel territory infarct. No visible mass lesion. No significant extra-axial fluid collection. Probable chronic microvascular ischemic changes noted. Vascular: No visible abnormal hyperdense vessel. Scattered calcified atherosclerosis present at skull base. Skull: Right occipital scalp contusion noted noted. No visible calvarial fracture. Sinuses/Orbits: Globes and orbital soft tissues grossly within normal limits. Paranasal sinuses are largely clear. No mastoid effusion. Other: None. ASPECTS [facility] Stroke Program Early CT Score) Acute ICH, does not apply. IMPRESSION: 1. Massive acute intraparenchymal hemorrhage involving the left cerebral hemisphere measuring approximately 4.8 x 9.4 x 5.2 cm (estimated volume 117 mL). Associated regional mass effect with up to 1.4 cm of left-to-right shift. Asymmetric dilatation of the right lateral ventricle concerning for trapping. Sec 2. Additional small 5 mm parenchymal hemorrhage at  the right parietal convexity. 3. Small focus of hemorrhage at the level of the medial left thalamus, possibly a small separate bleed versus trace intraventricular extension. Attention at follow-up recommended. 4. Right occipital scalp contusion. No visible calvarial fracture. These results were communicated to Dr. Iver Nestle at 8:28 pm on 05/23/2023 by text page via the [facility] messaging system. Electronically Signed   By: Rise Mu M.D.   On: 05/19/2023 20:30    Pertinent labs & imaging results that were available during my care of the patient were reviewed by me and considered in my medical decision making (see MDM for details).  Medications Ordered in ED Medications  sodium chloride flush (NS) 0.9 % injection 3 mL (has no administration in time range)  ondansetron (ZOFRAN) 4 MG/2ML injection (has no administration in time range)  0.9 %  sodium chloride infusion (has no administration in time range)  acetaminophen (TYLENOL) tablet 650 mg (has no administration in time range)    Or  acetaminophen (TYLENOL) suppository 650 mg (has no administration in time range)  glycopyrrolate (ROBINUL) tablet 1 mg ( Oral See Alternative 05/23/2023 2037)    Or  glycopyrrolate (ROBINUL) injection 0.2 mg ( Subcutaneous See Alternative 05/21/2023 2037)    Or  glycopyrrolate (ROBINUL) injection 0.2 mg (0.2 mg Intravenous Given 05/23/2023 2037)  polyvinyl alcohol (LIQUIFILM TEARS) 1.4 % ophthalmic solution 1 drop (has no administration in time range)  midazolam (VERSED) injection 2-4 mg (has no administration in time range)  morphine (PF) 2 MG/ML injection 2-4 mg (2 mg Intravenous Given 05/02/2023 2037)  ipratropium-albuterol (DUONEB) 0.5-2.5 (3) MG/3ML nebulizer  solution 3 mL (3 mLs Nebulization Not Given 06-04-23 2030)  albuterol (PROVENTIL) (2.5 MG/3ML) 0.083% nebulizer solution 2.5 mg (has no administration in time range)  ondansetron (ZOFRAN-ODT) disintegrating tablet 4 mg (has no administration in time range)    Or   ondansetron (ZOFRAN) injection 4 mg (has no administration in time range)  diphenhydrAMINE (BENADRYL) injection 25 mg (has no administration in time range)  ondansetron (ZOFRAN) injection 4 mg (4 mg Intravenous Given 04-Jun-2023 2030)                                                                                                                                     Procedures .Critical Care  Performed by: Lonell Grandchild, MD Authorized by: Lonell Grandchild, MD   Critical care provider statement:    Critical care time (minutes):  30   Critical care was necessary to treat or prevent imminent or life-threatening deterioration of the following conditions:  CNS failure or compromise   Critical care was time spent personally by me on the following activities:  Development of treatment plan with patient or surrogate, discussions with consultants, evaluation of patient's response to treatment, examination of patient, ordering and review of laboratory studies, ordering and review of radiographic studies, ordering and performing treatments and interventions, pulse oximetry, re-evaluation of patient's condition and review of old charts   (including critical care time)  Medical Decision Making / ED Course   MDM:  89 year old female presenting to the emergency department as a code stroke after being found down.  Patient was taken to CT scanner where a CT scan revealed that the patient had a very large intraparenchymal bleed.  Neurology physician Dr. Iver Nestle discussed this with the patient's son, patient is DNR.  The prognosis is extremely poor.  He has elected to proceed with comfort care measures only.  Patient will be kept comfortable.  Once patient expires her primary doctor will be notified to sign death certificate  Clinical Course as of Jun 04, 2023 2325  Sat 2023-06-04  2322 Patient is on comfort care. Signed out pending patient likely going to pass away, will need pronouncement [WS]     Clinical Course User Index [WS] Suezanne Jacquet Jerilee Field, MD     Additional history obtained: -Additional history obtained from family and ems   Lab Tests: -I ordered, reviewed, and interpreted labs.   The pertinent results include:   Labs Reviewed  CBC - Abnormal; Notable for the following components:      Result Value   WBC 11.7 (*)    All other components within normal limits  DIFFERENTIAL - Abnormal; Notable for the following components:   Lymphs Abs 5.7 (*)    Monocytes Absolute 1.1 (*)    All other components within normal limits  COMPREHENSIVE METABOLIC PANEL - Abnormal; Notable for the following components:   Chloride 97 (*)    CO2 20 (*)    Glucose,  Bld 172 (*)    Creatinine, Ser 1.17 (*)    AST 42 (*)    GFR, Estimated 45 (*)    Anion gap 18 (*)    All other components within normal limits  I-STAT CHEM 8, ED - Abnormal; Notable for the following components:   BUN 25 (*)    Creatinine, Ser 1.10 (*)    Glucose, Bld 174 (*)    Calcium, Ion 1.09 (*)    TCO2 21 (*)    Hemoglobin 15.6 (*)    All other components within normal limits  CBG MONITORING, ED - Abnormal; Notable for the following components:   Glucose-Capillary 171 (*)    All other components within normal limits  PROTIME-INR  APTT  ETHANOL    Notable for mild aki, leukocytosis  Imaging Studies ordered: I ordered imaging studies including CT head On my interpretation imaging demonstrates acute large bleed I independently visualized and interpreted imaging. I agree with the radiologist interpretation   Medicines ordered and prescription drug management: Meds ordered this encounter  Medications   sodium chloride flush (NS) 0.9 % injection 3 mL   ondansetron (ZOFRAN) injection 4 mg   ondansetron (ZOFRAN) 4 MG/2ML injection    Ivonne Andrew A: cabinet override   0.9 %  sodium chloride infusion   OR Linked Order Group    acetaminophen (TYLENOL) tablet 650 mg    acetaminophen (TYLENOL) suppository  650 mg   OR Linked Order Group    glycopyrrolate (ROBINUL) tablet 1 mg    glycopyrrolate (ROBINUL) injection 0.2 mg    glycopyrrolate (ROBINUL) injection 0.2 mg   polyvinyl alcohol (LIQUIFILM TEARS) 1.4 % ophthalmic solution 1 drop   midazolam (VERSED) injection 2-4 mg   morphine (PF) 2 MG/ML injection 2-4 mg   ipratropium-albuterol (DUONEB) 0.5-2.5 (3) MG/3ML nebulizer solution 3 mL   albuterol (PROVENTIL) (2.5 MG/3ML) 0.083% nebulizer solution 2.5 mg   OR Linked Order Group    ondansetron (ZOFRAN-ODT) disintegrating tablet 4 mg    ondansetron (ZOFRAN) injection 4 mg   diphenhydrAMINE (BENADRYL) injection 25 mg    -I have reviewed the patients home medicines and have made adjustments as needed   Consultations Obtained: I requested consultation with the neurologist,  and discussed lab and imaging findings as well as pertinent plan - they recommend: discussed with family, comfort care measures   Cardiac Monitoring: The patient was maintained on a cardiac monitor.  I personally viewed and interpreted the cardiac monitored which showed an underlying rhythm of: NSR   Reevaluation: After the interventions noted above, I reevaluated the patient and found that their symptoms have worsened  Co morbidities that complicate the patient evaluation  Past Medical History:  Diagnosis Date   Acute CHF (congestive heart failure) (HCC) 03/12/2021   Arrhythmia    CHF (congestive heart failure) (HCC)    Hyperlipidemia    Hypertension    Rheumatic heart disease       Dispostion: Disposition decision including need for hospitalization was considered, and patient disposition pending at time of sign out. But anticipate patient will expire.    Final Clinical Impression(s) / ED Diagnoses Final diagnoses:  Intracranial bleeding (HCC)     This chart was dictated using voice recognition software.  Despite best efforts to proofread,  errors can occur which can change the documentation  meaning.    Lonell Grandchild, MD 2023/06/08 2325

## 2023-05-17 NOTE — Consult Note (Signed)
Neurology Consultation Reason for Consult: code stroke  Requesting Physician: Alvino Blood   CC: Fall and confusion  History is obtained from: Son, EMS, chart review  HPI: Amy [name] is a 89 y.o. female with a past medical history significant for hypertension, hyperlipidemia, paroxysmal atrial fibrillation (discontinued Xarelto in April 2024), remote GI bleed, previous heart failure with reduced EF, DNR/DNI CODE STATUS  Send reports she was in her usual state of health today.  He had last seen her at noon.  However just past 7 PM and a neighbor called him telling him that the patient had called the neighbor reporting that she had had a fall, and the patient appeared quite confused on the phone; a neighbor had last spoken to the patient at 5 PM at which time she was fine.  Son came home to see her while activating EMS and noted she was weak on the right side with confusion.  Initial blood pressure on EMS arrival was 200s over 100s and sugar was 177.  En route with EMS she began to have respiratory difficulty desatting to the 70s and requiring nonrebreather to maintain her O2 sats at 95 with lung sounds concerning for pulmonary edema.  She was also becoming sleepier and not moving any of her extremities well by the time of arrival  LKW: 5 PM Thrombolytic given?: No, ICH IA performed?: No, ICH Premorbid modified rankin scale:     2 - Slight disability. Able to look after own affairs without assistance, but unable to carry out all previous activities.   ICH Score: 4  Time performed: 8:28 PM GCS: 5-12 is 1 point Infratentorial: No.. If yes, 1 point -- 0  Volume: >30cc is 1 point  Age: 89 y.o.. >80 is 1 point Intraventricular extension is 1 point -- 1  Score: 4  A Score of 4 points has a 30 day mortality of 97%. Stroke. 2001 Apr;32(4):891-7.    ROS: Unable to obtain due to altered mental status.   Past Medical History:  Diagnosis Date   Acute CHF (congestive heart failure) (HCC)  03/12/2021   Arrhythmia    CHF (congestive heart failure) (HCC)    Hyperlipidemia    Hypertension    Rheumatic heart disease    Past Surgical History:  Procedure Laterality Date   APPENDECTOMY     Current Outpatient Medications  Medication Instructions   amiodarone (PACERONE) 100 mg, Oral, Every morning   buPROPion (WELLBUTRIN XL) 150 mg, Oral, Every morning   carvedilol (COREG) 6.25 mg, Oral, 2 times daily with meals   losartan (COZAAR) 25 mg, Oral, Every morning   rosuvastatin (CRESTOR) 20 mg, Oral, Daily at bedtime   spironolactone (ALDACTONE) 12.5 mg, Oral, Every morning     Family History  Problem Relation Age of Onset   Stroke Maternal Grandmother     Social History:  reports that she has quit smoking. Her smoking use included cigarettes. She has a 15 pack-year smoking history. She has never used smokeless tobacco. She reports that she does not drink alcohol and does not use drugs.   Exam: Current vital signs: BP (!) 139/97 (BP Location: Right Arm)   Pulse 87   Temp 97.8 F (36.6 C) (Temporal)   Resp (!) 30   Ht 5\' 2"  (1.575 m)   Wt 57.6 kg   SpO2 94%   BMI 23.23 kg/m  Vital signs in last 24 hours: Temp:  [97.8 F (36.6 C)] 97.8 F (36.6 C) May 24, 2023 2025) Pulse Rate:  [87]  87 05/28/23 2025) Resp:  [30] 30 05/28/23 2025) BP: (139)/(97) 139/97 2023/05/28 2025) SpO2:  [94 %] 94 % 05-28-23 2025) Weight:  [57.6 kg-57.8 kg] 57.6 kg May 28, 2023 June 11, 2030)   Physical Exam  Constitutional: Appears acutely ill Psych: Minimally interactive Eyes: No scleral injection HENT: Non rebreather in place MSK: no major joint deformities.  Cardiovascular: Irregularly irregular rhythm, perfusing extremities well Respiratory: Tachypneic and using accessory muscles GI: Soft.  No distension. Skin: No obvious signs of trauma  Neuro: Mental Status: Drowsy, does not track examiner, does not follow commands, nonverbal Cranial Nerves: II: No blink to threat.  Pupils are equal, round, and reactive  to light, 4 mm and sluggishly constricting to 3 mm bilaterally III,IV, VI, VIII: VOR intact V: Minimal response to eyelash brush VII: Minimal grimace, grossly symmetric VIII: hearing is intact to voice Sensory/motor: Slight flexion versus withdrawal to noxious stimulation, symmetric in all 4 extremities  NIHSS total 25   I have reviewed labs in epic and the results pertinent to this consultation are:  Basic Metabolic Panel: Recent Labs  Lab 05/28/23 12-Jun-2007 05/28/2023 2010  NA 135 135  K 4.8 4.7  CL 97* 102  CO2 20*  --   GLUCOSE 172* 174*  BUN 22 25*  CREATININE 1.17* 1.10*  CALCIUM 9.9  --     CBC: Recent Labs  Lab 05/28/23 06/12/2007 May 28, 2023 2010  WBC 11.7*  --   NEUTROABS 4.7  --   HGB 14.9 15.6*  HCT 45.6 46.0  MCV 94.6  --   PLT 276  --     Coagulation Studies: Recent Labs    05/28/23 2007-06-12  LABPROT 12.6  INR 0.9      I have reviewed the images obtained:  Head CT personally reviewed, reviewed with family at bedside, agree with radiology read 1. Massive acute intraparenchymal hemorrhage involving the left cerebral hemisphere measuring approximately 4.8 x 9.4 x 5.2 cm (estimated volume 117 mL). Associated regional mass effect with up to 1.4 cm of left-to-right shift. Asymmetric dilatation of the right lateral ventricle concerning for trapping. Sec 2. Additional small 5 mm parenchymal hemorrhage at the right parietal convexity. 3. Small focus of hemorrhage at the level of the medial left thalamus, possibly a small separate bleed versus trace intraventricular extension. Attention at follow-up recommended. 4. Right occipital scalp contusion. No visible calvarial fracture.   Impression: Devastating intracerebral hemorrhage.  Given patient's expressed goals of care, comfort care measures are appropriate and son agreed.  Given the rapidity of her decline to date, I expect she will pass within the next few hours, although we discussed that prognostication to that  level can be challenging.  Imaging was reviewed personally with family and all questions were answered.  I expressed my gratitude to the son for being an advocate for his mom and being able to tell us her wishes  Recommendations: -Comfort care order set initiated -Neurology will sign off at this time, please do not hesitate to reach out if questions or concerns arise  Brooke Dare MD-PhD [facility] [phone] Available 7 PM to 7 AM, outside of these hours please call Neurologist on call as listed on Amion.    Total critical care time: 45 minutes   Critical care time was exclusive of separately billable procedures and treating other patients.   Critical care was necessary to treat or prevent imminent or life-threatening deterioration, emergent evaluation for consideration of thrombectomy or thrombolytic, goals of care discussions given critical intracerebral hemorrhage.   Critical  care was time spent personally by me on the following activities: development of treatment plan with patient and/or surrogate as well as nursing, discussions with consultants/primary team, evaluation of patient's response to treatment, examination of patient, obtaining history from patient or surrogate, ordering and performing treatments and interventions, ordering and review of laboratory studies, ordering and review of radiographic studies, and re-evaluation of patient's condition as needed, as documented above.

## 2023-05-17 NOTE — ED Provider Notes (Signed)
  Provider Note MRN:  [id number]  Arrival date & time: 05/18/23    ED Course and Medical Decision Making  Assumed care from Dr. Suezanne Jacquet at shift change.  Presenting unresponsive, DNR/DNI found to have large IPH.  Now comfort care.  11:30 PM update: Patient seems to be resting comfortably, no grimacing, receiving supplemental oxygen for comfort.  Son at bedside.  Will continue to monitor.  4 AM update: Patient continues to have normal vital signs, starting to doubt that patient will pass away here in the emergency department, will consult medicine for admission.  6 AM update: Accepted by hospitalist service.   .Critical Care  Performed by: Sabas Sous, MD Authorized by: Sabas Sous, MD   Critical care provider statement:    Critical care time (minutes):  32   Critical care was necessary to treat or prevent imminent or life-threatening deterioration of the following conditions:  CNS failure or compromise   Critical care was time spent personally by me on the following activities:  Development of treatment plan with patient or surrogate, discussions with consultants, evaluation of patient's response to treatment, examination of patient, ordering and review of laboratory studies, ordering and review of radiographic studies, ordering and performing treatments and interventions, pulse oximetry, re-evaluation of patient's condition and review of old charts   Final Clinical Impressions(s) / ED Diagnoses     ICD-10-CM   1. Intracranial bleeding [facility])  I62.9       ED Discharge Orders     None       Discharge Instructions   None     Elmer Sow. Pilar Plate, MD [facility] Emergency Medicine [facility] [email or web address]    Sabas Sous, MD 05/18/23 [phone]

## 2023-05-17 NOTE — ED Triage Notes (Signed)
  Patient BIB EMS for code stroke.  Patient last known well time around 1700.  Patient on NRB and slightly posturing on R side.  Hx afib and recent falls.

## 2023-05-18 ENCOUNTER — Encounter (HOSPITAL_COMMUNITY): Payer: Self-pay | Admitting: Internal Medicine

## 2023-05-18 DIAGNOSIS — S0003XA Contusion of scalp, initial encounter: Secondary | ICD-10-CM | POA: Diagnosis present

## 2023-05-18 DIAGNOSIS — Z515 Encounter for palliative care: Secondary | ICD-10-CM | POA: Diagnosis not present

## 2023-05-18 DIAGNOSIS — E785 Hyperlipidemia, unspecified: Secondary | ICD-10-CM | POA: Diagnosis present

## 2023-05-18 DIAGNOSIS — J9601 Acute respiratory failure with hypoxia: Secondary | ICD-10-CM | POA: Diagnosis present

## 2023-05-18 DIAGNOSIS — I10 Essential (primary) hypertension: Secondary | ICD-10-CM

## 2023-05-18 DIAGNOSIS — G9341 Metabolic encephalopathy: Secondary | ICD-10-CM | POA: Diagnosis not present

## 2023-05-18 DIAGNOSIS — G935 Compression of brain: Secondary | ICD-10-CM | POA: Diagnosis present

## 2023-05-18 DIAGNOSIS — Z9049 Acquired absence of other specified parts of digestive tract: Secondary | ICD-10-CM | POA: Diagnosis not present

## 2023-05-18 DIAGNOSIS — Z8679 Personal history of other diseases of the circulatory system: Secondary | ICD-10-CM

## 2023-05-18 DIAGNOSIS — R29725 NIHSS score 25: Secondary | ICD-10-CM | POA: Diagnosis present

## 2023-05-18 DIAGNOSIS — S06359A Traumatic hemorrhage of left cerebrum with loss of consciousness of unspecified duration, initial encounter: Secondary | ICD-10-CM

## 2023-05-18 DIAGNOSIS — I480 Paroxysmal atrial fibrillation: Secondary | ICD-10-CM | POA: Diagnosis present

## 2023-05-18 DIAGNOSIS — W19XXXA Unspecified fall, initial encounter: Secondary | ICD-10-CM | POA: Diagnosis present

## 2023-05-18 DIAGNOSIS — Z79899 Other long term (current) drug therapy: Secondary | ICD-10-CM | POA: Diagnosis not present

## 2023-05-18 DIAGNOSIS — I619 Nontraumatic intracerebral hemorrhage, unspecified: Secondary | ICD-10-CM | POA: Diagnosis present

## 2023-05-18 DIAGNOSIS — Z823 Family history of stroke: Secondary | ICD-10-CM | POA: Diagnosis not present

## 2023-05-18 DIAGNOSIS — R451 Restlessness and agitation: Secondary | ICD-10-CM | POA: Diagnosis not present

## 2023-05-18 DIAGNOSIS — Z7189 Other specified counseling: Secondary | ICD-10-CM

## 2023-05-18 DIAGNOSIS — I629 Nontraumatic intracranial hemorrhage, unspecified: Secondary | ICD-10-CM | POA: Diagnosis not present

## 2023-05-18 DIAGNOSIS — I69151 Hemiplegia and hemiparesis following nontraumatic intracerebral hemorrhage affecting right dominant side: Secondary | ICD-10-CM | POA: Diagnosis not present

## 2023-05-18 DIAGNOSIS — Z87891 Personal history of nicotine dependence: Secondary | ICD-10-CM | POA: Diagnosis not present

## 2023-05-18 DIAGNOSIS — Z888 Allergy status to other drugs, medicaments and biological substances status: Secondary | ICD-10-CM | POA: Diagnosis not present

## 2023-05-18 DIAGNOSIS — S0635AA Traumatic hemorrhage of left cerebrum with loss of consciousness status unknown, initial encounter: Secondary | ICD-10-CM

## 2023-05-18 DIAGNOSIS — Z66 Do not resuscitate: Secondary | ICD-10-CM | POA: Diagnosis not present

## 2023-05-18 DIAGNOSIS — I5022 Chronic systolic (congestive) heart failure: Secondary | ICD-10-CM | POA: Diagnosis present

## 2023-05-18 DIAGNOSIS — I110 Hypertensive heart disease with heart failure: Secondary | ICD-10-CM | POA: Diagnosis present

## 2023-05-18 DIAGNOSIS — Z885 Allergy status to narcotic agent status: Secondary | ICD-10-CM | POA: Diagnosis not present

## 2023-05-18 DIAGNOSIS — R0609 Other forms of dyspnea: Secondary | ICD-10-CM | POA: Diagnosis not present

## 2023-05-18 MED ORDER — SODIUM CHLORIDE 0.9% FLUSH
3.0000 mL | Freq: Two times a day (BID) | INTRAVENOUS | Status: DC
  Administered 2023-05-18 (×2): 3 mL via INTRAVENOUS

## 2023-05-18 MED ORDER — SODIUM CHLORIDE 0.9% FLUSH: 3.0000 mL | INTRAVENOUS | Status: DC | PRN

## 2023-05-18 MED ORDER — DIPHENHYDRAMINE HCL 50 MG/ML IJ SOLN: 25.0000 mg | INTRAMUSCULAR | Status: DC | PRN

## 2023-05-18 MED ORDER — MORPHINE SULFATE (PF) 2 MG/ML IV SOLN: 2.0000 mg | INTRAVENOUS | Status: DC | PRN

## 2023-05-18 MED ORDER — SODIUM CHLORIDE 0.9 % IV SOLN: 250.0000 mL | INTRAVENOUS | Status: DC | PRN

## 2023-05-18 NOTE — H&P (Signed)
History and Physical    Amy [name] NGE:[id number] DOB: August 23, 1933 DOA: 2023-05-19  PCP: Tally Joe, MD   Patient coming from: Home   Chief Complaint:  Chief Complaint  Patient presents with   Code Stroke    HPI:  Amy [name] is a 89 y.o. female with medical history significant of essential hypertension, hyperlipidemia, paroxysmal atrial fibrillation (not currently on Xarelto since April 2024) disease, remote GI bleed and history of heart failure reduced EF 20 to 25% presented to emergency department with complaining of right-sided weakness and confusion. Per chart review and history from patient's son at the bedside patient was fine with her usual state of health today. He had last seen her at noon.  However just past 7 PM and a neighbor called him telling him that the patient had called the neighbor reporting that she had had a fall, and the patient appeared quite confused on the phone; a neighbor had last spoken to the patient at 5 PM at which time she was fine.  Son came home to see her while activating EMS and noted she was weak on the right side with confusion.  Initial blood pressure on EMS arrival was 200s over 100s and sugar was 177.  En route with EMS she began to have respiratory difficulty desatting to the 70s and requiring nonrebreather to maintain her O2 sats at 95 with lung sounds concerning for pulmonary edema.  She was also becoming sleepier and not moving any of her extremities well by the time of arrival.   ED Course:  At presentation to ED heart rate 87, respiratory rate 30, blood pressure 137/97 and O2 sat 94% on nonrebreather 15 L.  CMP showed sodium 135, potassium 4.8, chloride 97, bicarb 20, blood glucose 172, BUN 22, creatinine 1.17 and GFR 45. CBC WBC 11.7, RBC 4.82, hemoglobin 14.9 and platelet 276. aPTT 12.6 and INR 0.9 WNL.  Code stroke called in the ED.  CT head showed massive acute intraparenchymal hemorrhage involving left cerebral hemisphere 4.8 x 9.4 x  5.2 cm (estimated volume 117 mL). Associated regional mass effect with up to 1.4 cm of left-to-right shift. Asymmetric dilatation of the right lateral ventricle concerning for trapping.  Additional small 5 mm parenchymal hemorrhage at the right parietal convexity. . Small focus of hemorrhage at the level of the medial left  thalamus, possibly a small separate bleed versus trace intraventricular extension. Attention at follow-up recommended.. Right occipital scalp contusion. No visible calvarial fracture.  Patient is being evaluated by neurologist Dr. Iver Nestle in the ED.  Due to devastating intracranial hemorrhage with advanced age with multiple disease burden goals of care discussed with patient's family, comfort care measures are appropriate and patient's son agrees with comfort care measures.  Patient was already on DNR status before this incident happened.  Comfort care initiated at the emergency department.  Neurology signed off.  Appreciate input.   Review of Systems:  Review of Systems  Unable to perform ROS: Acuity of condition    Past Medical History:  Diagnosis Date   Acute CHF (congestive heart failure) (HCC) 03/12/2021   Arrhythmia    CHF (congestive heart failure) (HCC)    Hyperlipidemia    Hypertension    Rheumatic heart disease     Past Surgical History:  Procedure Laterality Date   APPENDECTOMY       reports that she has quit smoking. Her smoking use included cigarettes. She has a 15 pack-year smoking history. She has never used smokeless tobacco. She  reports that she does not drink alcohol and does not use drugs.  Allergies  Allergen Reactions   Lisinopril     Other reaction(s): cough   Oxycodone Nausea And Vomiting and Other (See Comments)    Intolerance--"its like I was floating up on the ceiling"     Family History  Problem Relation Age of Onset   Stroke Maternal Grandmother     Prior to Admission medications   Medication Sig Start Date End Date Taking?  Authorizing Provider  amiodarone (PACERONE) 100 MG tablet TAKE ONE TABLET BY MOUTH EVERY MORNING 02/17/23  Yes O'Neal, Ronnald Ramp, MD  buPROPion (WELLBUTRIN XL) 150 MG 24 hr tablet Take 150 mg by mouth every morning. 01/28/23  Yes [provider]  carvedilol (COREG) 6.25 MG tablet TAKE ONE TABLET BY MOUTH TWICE DAILY WITH A MEAL Patient taking differently: Take 3.125 mg by mouth 2 (two) times daily with a meal. 02/17/23  Yes O'Neal, Ronnald Ramp, MD  losartan (COZAAR) 25 MG tablet TAKE ONE TABLET BY MOUTH EVERY MORNING 02/17/23  Yes O'Neal, Ronnald Ramp, MD  rosuvastatin (CRESTOR) 20 MG tablet Take 20 mg by mouth at bedtime. 01/28/23  Yes [provider]  spironolactone (ALDACTONE) 25 MG tablet TAKE 1/2 TABLET BY MOUTH EVERY MORNING 02/17/23  Yes O'Neal, Ronnald Ramp, MD     Physical Exam: Vitals:   2023/06/04 2013 06-04-2023 2025 06-04-2023 2031 04-Jun-2023 2331  BP:  (!) 139/97    Pulse:  87  89  Resp:  (!) 30    Temp:  97.8 F (36.6 C)    TempSrc:  Temporal    SpO2:  94%  99%  Weight: 57.8 kg  57.6 kg   Height:   5\' 2"  (1.575 m)     Physical Exam HENT:     Head: Normocephalic and atraumatic.     Mouth/Throat:     Mouth: Mucous membranes are moist.  Eyes:     Pupils: Pupils are equal, round, and reactive to light.  Cardiovascular:     Rate and Rhythm: Normal rate. Rhythm irregular.     Heart sounds: Normal heart sounds.  Pulmonary:     Effort: Respiratory distress present.  Abdominal:     General: Bowel sounds are normal.  Musculoskeletal:     Cervical back: Neck supple.     Right lower leg: No edema.     Left lower leg: No edema.  Skin:    Capillary Refill: Capillary refill takes less than 2 seconds.      Labs on Admission: I have personally reviewed following labs and imaging studies  CBC: Recent Labs  Lab 2023-06-04 2008 2023-06-04 2010  WBC 11.7*  --   NEUTROABS 4.7  --   HGB 14.9 15.6*  HCT 45.6 46.0  MCV 94.6  --   PLT 276  --    Basic  Metabolic Panel: Recent Labs  Lab 06/04/23 2008 Jun 04, 2023 2010  NA 135 135  K 4.8 4.7  CL 97* 102  CO2 20*  --   GLUCOSE 172* 174*  BUN 22 25*  CREATININE 1.17* 1.10*  CALCIUM 9.9  --    GFR: Estimated Creatinine Clearance: 28 mL/min (A) (by C-G formula based on SCr of 1.1 mg/dL (H)). Liver Function Tests: Recent Labs  Lab 06/04/23 2008  AST 42*  ALT 29  ALKPHOS 95  BILITOT 0.5  PROT 8.0  ALBUMIN 4.5   No results for input(s): "LIPASE", "AMYLASE" in the last 168 hours. No results for input(s): "  AMMONIA" in the last 168 hours. Coagulation Profile: Recent Labs  Lab June 08, 2023 2008  INR 0.9   Cardiac Enzymes: No results for input(s): "CKTOTAL", "CKMB", "CKMBINDEX", "TROPONINI", "TROPONINIHS" in the last 168 hours. BNP (last 3 results) No results for input(s): "BNP" in the last 8760 hours. HbA1C: No results for input(s): "HGBA1C" in the last 72 hours. CBG: Recent Labs  Lab 06/08/23 2006  GLUCAP 171*   Lipid Profile: No results for input(s): "CHOL", "HDL", "LDLCALC", "TRIG", "CHOLHDL", "LDLDIRECT" in the last 72 hours. Thyroid Function Tests: No results for input(s): "TSH", "T4TOTAL", "FREET4", "T3FREE", "THYROIDAB" in the last 72 hours. Anemia Panel: No results for input(s): "VITAMINB12", "FOLATE", "FERRITIN", "TIBC", "IRON", "RETICCTPCT" in the last 72 hours. Urine analysis:    Component Value Date/Time   COLORURINE YELLOW 04/03/2022 1805   APPEARANCEUR HAZY (A) 04/03/2022 1805   LABSPEC 1.014 04/03/2022 1805   PHURINE 6.0 04/03/2022 1805   GLUCOSEU NEGATIVE 04/03/2022 1805   HGBUR LARGE (A) 04/03/2022 1805   BILIRUBINUR NEGATIVE 04/03/2022 1805   BILIRUBINUR negative 10/24/2016 1106   KETONESUR NEGATIVE 04/03/2022 1805   PROTEINUR NEGATIVE 04/03/2022 1805   UROBILINOGEN 1.0 10/24/2016 1106   NITRITE NEGATIVE 04/03/2022 1805   LEUKOCYTESUR SMALL (A) 04/03/2022 1805    Radiological Exams on Admission: I have personally reviewed images CT HEAD CODE  STROKE WO CONTRAST  Result Date: 06/08/2023 CLINICAL DATA:  Code stroke. Initial evaluation for neuro deficit, stroke. EXAM: CT HEAD WITHOUT CONTRAST TECHNIQUE: Contiguous axial images were obtained from the base of the skull through the vertex without intravenous contrast. RADIATION DOSE REDUCTION: This exam was performed according to the departmental dose-optimization program which includes automated exposure control, adjustment of the mA and/or kV according to patient size and/or use of iterative reconstruction technique. COMPARISON:  Prior study from 05/15/2018 FINDINGS: Brain: Massive acute intraparenchymal hemorrhage involving the left cerebral hemisphere is seen. Bleed measures approximately 4.8 x 9.4 x 5.2 cm (estimated volume 117 mL). Surrounding vasogenic edema with regional mass effect. Left lateral ventricle is effaced. Associated up to 1.4 cm of left-to-right shift. Asymmetric dilatation of the right lateral ventricle concerning for trapping. Secondary basilar cistern crowding without transtentorial herniation. Possible trace intraventricular extension with blood in the compressed third ventricle versus an additional separate but small bleed at the left thalamus noted (series 2, image 17). An additional small 5 mm parenchymal hemorrhage noted at the right parietal convexity (series 2, image 22). No other acute large vessel territory infarct. No visible mass lesion. No significant extra-axial fluid collection. Probable chronic microvascular ischemic changes noted. Vascular: No visible abnormal hyperdense vessel. Scattered calcified atherosclerosis present at skull base. Skull: Right occipital scalp contusion noted noted. No visible calvarial fracture. Sinuses/Orbits: Globes and orbital soft tissues grossly within normal limits. Paranasal sinuses are largely clear. No mastoid effusion. Other: None. ASPECTS [facility] Stroke Program Early CT Score) Acute ICH, does not apply. IMPRESSION: 1. Massive acute  intraparenchymal hemorrhage involving the left cerebral hemisphere measuring approximately 4.8 x 9.4 x 5.2 cm (estimated volume 117 mL). Associated regional mass effect with up to 1.4 cm of left-to-right shift. Asymmetric dilatation of the right lateral ventricle concerning for trapping. Sec 2. Additional small 5 mm parenchymal hemorrhage at the right parietal convexity. 3. Small focus of hemorrhage at the level of the medial left thalamus, possibly a small separate bleed versus trace intraventricular extension. Attention at follow-up recommended. 4. Right occipital scalp contusion. No visible calvarial fracture. These results were communicated to Dr. Iver Nestle at 8:28 pm on 06/08/23  by text page via the [facility] messaging system. Electronically Signed   By: Rise Mu M.D.   On: 05/25/2023 20:30      Assessment/Plan: Principal Problem:   Intracranial hemorrhage (HCC) Active Problems:   Hemorrhagic stroke (HCC)   Hemorrhage of left cerebral hemisphere following fall (HCC)   Essential hypertension   History of atrial fibrillation   Acute metabolic encephalopathy    Assessment and Plan: Comfort care -Comfort care order set has been utilized. - Continue Tylenol as needed for fever - Continue DuoNeb if 6-hour scheduled and Proventil  nebulizer every 2 hours as needed for wheezing shortness of breath - Continue Benadryl 25 mg every 4 hours as needed for sleep - Continue glycopyrrolate as needed for excessive secretion - Continue morphine 2 to 4 mg every 30-minute as needed for severe pain and dyspnea. -Continue Zofran as needed - Continue facemask ventilation O2 sat above 92% -Consulted spiritual care for chaplain service.   Acute debility encephalopathy-secondary from massive cerebral hemorrhage Hemorrhage stroke Left cerebral hemorrhage Right-sided weakness Acute hypoxic respiratory failure -Patient last known well 7 PM 04/28/2023.  Patient called her neighbor complaining about a  fall and right-sided weakness.  Patient found confused by EMS and O2 sat dropped to 70% requiring nonrebreather.  Patient found more sleepy here and not moving her extremities at time of arrival to the ED.  -Code stroke called in the ED.  CT head showed massive acute intraparenchymal hemorrhage involving left cerebral hemisphere 4.8 x 9.4 x 5.2 cm (estimated volume 117 mL). Associated regional mass effect with up to 1.4 cm of left-to-right shift. Asymmetric dilatation of the right lateral ventricle concerning for trapping.  Additional small 5 mm parenchymal hemorrhage at the right parietal convexity. . Small focus of hemorrhage at the level of the medial left  thalamus, possibly a small separate bleed versus trace intraventricular extension. Attention at follow-up recommended.. Right occipital scalp contusion. No visible calvarial fracture. - Patient is being evaluated by neurologist Dr. Iver Nestle in the ED.  Due to devastating intracranial hemorrhage with advanced age with multiple disease burden goals of care discussed with patient's family, comfort care measures are appropriate and patient's son agrees with comfort care measures.  Patient was already on DNR status before this incident happened.  We expressed gratitude to patient's son for being forthcoming and being an advocate for his mom and being able to tell us her wishes. -Appreciate neurology input. -Comfort care has been initiated in the emergency department and hospitalist team has been consulted to admit patient.   DVT prophylaxis: No SCD no pharmacological prophylaxis as patient is on comfort care now Code Status:  DNR/DNI(Do NOT Intubate) Diet: N.p.o. Family Communication: Patient's son at the bedside updated Disposition Plan: Unclear at this point Consults: Neurology and spiritual care Admission status:   Observation, Med-Surg  Severity of Illness: The appropriate patient status for this patient is OBSERVATION. Observation status is  judged to be reasonable and necessary in order to provide the required intensity of service to ensure the patient's safety. The patient's presenting symptoms, physical exam findings, and initial radiographic and laboratory data in the context of their medical condition is felt to place them at decreased risk for further clinical deterioration. Furthermore, it is anticipated that the patient will be medically stable for discharge from the hospital within 2 midnights of admission.     Tereasa Coop, MD [facility]  How to contact the [facility] Attending or Consulting provider 7A - 7P or covering provider during  after hours 7P -7A, for this patient.  Check the care team in [facility] and look for a) attending/consulting [facility] provider listed and b) the [facility] team listed Log into [email or web address] and use [place]'s universal password to access. If you do not have the password, please contact the hospital operator. Locate the [facility] provider you are looking for under [facility] and page to a number that you can be directly reached. If you still have difficulty reaching the provider, please page the [facility] (Director on Call) for the Hospitalists listed on amion for assistance.  05/18/2023, 6:32 AM

## 2023-05-18 NOTE — Consult Note (Signed)
Palliative Care Consult Note                                  Date: 05/18/2023   Patient Name: Amy [name]  DOB: 05-27-1933  MRN: [id number]  Age / Sex: 89 y.o., female  PCP: Tally Joe, MD Referring Physician: Barnetta Chapel, MD  Reason for Consultation: Disposition and Psychosocial/spiritual support after patient placed on comfort care.  HPI/Patient Profile: 89 y.o. female  with past medical history of HTN, HLD, paroxysmal a-fib, remote GI bleed, and heart failure (EF 20-25%) admitted on 04/29/2023 by EMS with right-sided weakness and confusion.   Code stroke called in the ED. CT head showed massive acute intraparenchymal hemorrhage involving left cerebral hemisphere and 1.4 cm left-to-right shift. Son agreed to comfort measures.    Past Medical History:  Diagnosis Date   Acute CHF (congestive heart failure) (HCC) 03/12/2021   Arrhythmia    CHF (congestive heart failure) (HCC)    Hyperlipidemia    Hypertension    Rheumatic heart disease     Subjective:   I have reviewed medical records including EPIC notes, labs and imaging, assessed the patient and then met with the patient's son Francee Piccolo to discuss EOL wishes, disposition and options.  I introduced Palliative Medicine as specialized medical care for people living with serious illness. It focuses on providing relief from symptoms and stress of a serious illness. The goal is to improve quality of life for both the patient and the family.  Today's Discussion: The patient is widowed with four children. Only Francee Piccolo is local, two of the other children are on the way. The children are understandably surprised and upset about their mother's sudden decline. Francee Piccolo does note that his mother had been missing his father and was looking forward to seeing him again in death.  We discussed what comfort measures would look like for this patient. The patient would no longer receive aggressive  medical interventions such as continuous vital signs, lab work, radiology testing, or medications not focused on comfort. All care would focus on how the patient is looking and feeling. This would include management of any symptoms that may cause discomfort, pain, shortness of breath, cough, nausea, agitation, anxiety, and/or secretions etc. Symptoms would be managed with medications and other non-pharmacological interventions such as spiritual support if requested, repositioning, or therapeutic listening. Derek verbalized understanding and appreciation.   Francee Piccolo would like for his mother to be evaluated for an inpatient hospice placement. We discussed that we can never know for sure how long somebody has to live and if she were to become too unstable she could have a comfortable death here in the hospital.  Questions and concerns were addressed. "Gone from my sight" booklet left for review. The family was encouraged to call with questions or concerns. PMT will continue to support holistically.  Review of Systems  Unable to perform ROS   Objective:   Primary Diagnoses: Present on Admission:  Essential hypertension   Physical Exam Constitutional:      Appearance: She is ill-appearing.     Interventions: Face mask in place.  HENT:     Head: Normocephalic and atraumatic.  Pulmonary:     Effort: Pulmonary effort is normal.  Skin:    General: Skin is warm and dry.  Neurological:     Mental Status: She is unresponsive.     Vital Signs:  BP (!) 156/67 (BP Location:  Left Arm)   Pulse 100   Temp 98.2 F (36.8 C) (Oral)   Resp 17   Ht 5\' 2"  (1.575 m)   Wt 57.6 kg   SpO2 97%   BMI 23.23 kg/m   Palliative Assessment/Data: 10%    Advanced Care Planning:   Existing Vynca/ACP Documentation: None  Primary Decision Maker: Patient's children. Patient's son Francee Piccolo is local.   Code Status/Advance Care Planning: DNR: Comfort measures only   Assessment & Plan:   SUMMARY OF  RECOMMENDATIONS   Continue comfort measures TOC referral placed for inpatient hospice Continued PMT support  Symptom Management:  Per MAR managed by attending team  Prognosis:  < 2 weeks  Discharge Planning:  To Be Determined   Discussed with: Dr. Dartha Lodge and bedside RN   Thank you for allowing [place] to participate in the care of Coren Crownover PMT will continue to support holistically.  Time Total: 60 minutes    Signed by: Sarina Ser, NP Palliative Medicine Team  Team Phone # [phone] (Nights/Weekends)  05/18/2023, 8:44 AM

## 2023-05-18 NOTE — ED Notes (Signed)
Pt transported to assigned room with medic. Comfort measures in place. VSS,NAD.

## 2023-05-18 NOTE — ED Notes (Signed)
ED TO INPATIENT HANDOFF REPORT  ED Nurse Name and Phone #:  Ivonne Creighton Longley RN [phone]  S Name/Age/Gender Amy [name] 89 y.o. female Room/Bed: 025C/025C  Code Status   Code Status: DNR  Home/SNF/Other    Is this baseline?   Triage Complete: Triage complete  Chief Complaint Intracranial hemorrhage [facility]) [I62.9]  Triage Note  Patient BIB EMS for code stroke.  Patient last known well time around 1700.  Patient on NRB and slightly posturing on R side.  Hx afib and recent falls.    Allergies Allergies  Allergen Reactions   Lisinopril     Other reaction(s): cough   Oxycodone Nausea And Vomiting and Other (See Comments)    Intolerance--"its like I was floating up on the ceiling"     Level of Care/Admitting Diagnosis ED Disposition     ED Disposition  Admit   Condition  --   Comment  Hospital Area: [facility] [[id number]]  Level of Care: Med-Surg [16]  May place patient in observation at [facility] or [facility] if equivalent level of care is available:: No  Covid Evaluation: Asymptomatic - no recent exposure (last 10 days) testing not required  Diagnosis: Intracranial hemorrhage [facility]) [[id number]]  Admitting Physician: Tereasa Coop [[id number]]  Attending Physician: Tereasa Coop [[id number]]          B Medical/Surgery History Past Medical History:  Diagnosis Date   Acute CHF (congestive heart failure) (HCC) 03/12/2021   Arrhythmia    CHF (congestive heart failure) (HCC)    Hyperlipidemia    Hypertension    Rheumatic heart disease    Past Surgical History:  Procedure Laterality Date   APPENDECTOMY       A IV Location/Drains/Wounds Patient Lines/Drains/Airways Status     Active Line/Drains/Airways     Name Placement date Placement time Site Days   Peripheral IV 04/03/22 20 G 1" Anterior;Proximal;Right Forearm 04/03/22  2030  Forearm  410            Intake/Output Last 24 hours No intake or output data in the 24 hours ending  05/18/23 0700  Labs/Imaging Results for orders placed or performed during the hospital encounter of 05/06/2023 (from the past 48 hour(s))  CBG monitoring, ED     Status: Abnormal   Collection Time: 05/25/2023  8:06 PM  Result Value Ref Range   Glucose-Capillary 171 (H) 70 - 99 mg/dL    Comment: Glucose reference range applies only to samples taken after fasting for at least 8 hours.  Protime-INR     Status: None   Collection Time: 05/23/2023  8:08 PM  Result Value Ref Range   Prothrombin Time 12.6 11.4 - 15.2 seconds   INR 0.9 0.8 - 1.2    Comment: (NOTE) INR goal varies based on device and disease states. Performed at [facility], [address] [place], [place] [address]   APTT     Status: None   Collection Time: 05/19/2023  8:08 PM  Result Value Ref Range   aPTT 30 24 - 36 seconds    Comment: Performed at [facility], [address] [place], [place] [address]  CBC     Status: Abnormal   Collection Time: 05/03/2023  8:08 PM  Result Value Ref Range   WBC 11.7 (H) 4.0 - 10.5 K/uL   RBC 4.82 3.87 - 5.11 MIL/uL   Hemoglobin 14.9 12.0 - 15.0 g/dL   HCT 44.0 10.2 - 72.5 %   MCV 94.6  80.0 - 100.0 fL   MCH 30.9 26.0 - 34.0 pg   MCHC 32.7 30.0 - 36.0 g/dL   RDW 08.6 57.8 - 46.9 %   Platelets 276 150 - 400 K/uL   nRBC 0.0 0.0 - 0.2 %    Comment: Performed at [facility], [address] [place], [place] [address]  Differential     Status: Abnormal   Collection Time: 05/04/2023  8:08 PM  Result Value Ref Range   Neutrophils Relative % 40 %   Neutro Abs 4.7 1.7 - 7.7 K/uL   Lymphocytes Relative 49 %   Lymphs Abs 5.7 (H) 0.7 - 4.0 K/uL   Monocytes Relative 9 %   Monocytes Absolute 1.1 (H) 0.1 - 1.0 K/uL   Eosinophils Relative 2 %   Eosinophils Absolute 0.2 0.0 - 0.5 K/uL   Basophils Relative 0 %   Basophils Absolute 0.0 0.0 - 0.1 K/uL   nRBC 0 0 /100 WBC   Abs Immature Granulocytes 0.00 0.00 - 0.07 K/uL    Comment: Performed at [facility], [address] [place], [place] [address]  Comprehensive metabolic panel     Status: Abnormal   Collection Time: 05/23/2023  8:08 PM  Result Value Ref Range   Sodium 135 135 - 145 mmol/L   Potassium 4.8 3.5 - 5.1 mmol/L   Chloride 97 (L) 98 - 111 mmol/L   CO2 20 (L) 22 - 32 mmol/L   Glucose, Bld 172 (H) 70 - 99 mg/dL    Comment: Glucose reference range applies only to samples taken after fasting for at least 8 hours.   BUN 22 8 - 23 mg/dL   Creatinine, Ser 4.40 (H) 0.44 - 1.00 mg/dL   Calcium 9.9 8.9 - 10.2 mg/dL   Total Protein 8.0 6.5 - 8.1 g/dL   Albumin 4.5 3.5 - 5.0 g/dL   AST 42 (H) 15 - 41 U/L   ALT 29 0 - 44 U/L   Alkaline Phosphatase 95 38 - 126 U/L   Total Bilirubin 0.5 0.3 - 1.2 mg/dL   GFR, Estimated 45 (L) >60 mL/min    Comment: (NOTE) Calculated using the CKD-EPI Creatinine Equation (2021)    Anion gap 18 (H) 5 - 15    Comment: Performed at [facility], [address], [place], [place] [address]  Ethanol     Status: None   Collection Time: 05/08/2023  8:08 PM  Result Value Ref Range   Alcohol, Ethyl (B) <10 <10 mg/dL    Comment: (NOTE) Lowest detectable limit for serum alcohol is 10 mg/dL.  For medical purposes only. Performed at [facility], [address] [place], [place] [address]   I-stat chem 8, ED     Status: Abnormal   Collection Time: 05/23/2023  8:10 PM  Result Value Ref Range   Sodium 135 135 - 145 mmol/L   Potassium 4.7 3.5 - 5.1 mmol/L   Chloride 102 98 - 111 mmol/L   BUN 25 (H) 8 - 23 mg/dL   Creatinine, Ser 4.74 (H) 0.44 - 1.00 mg/dL   Glucose, Bld 259 (H) 70 - 99 mg/dL    Comment: Glucose reference range applies only to samples taken after fasting for at least 8 hours.   Calcium, Ion 1.09 (L) 1.15 - 1.40 mmol/L   TCO2 21 (L) 22 - 32 mmol/L   Hemoglobin 15.6 (H) 12.0 - 15.0 g/dL   HCT 56.3 87.5 - 64.3 %  CT HEAD CODE STROKE WO CONTRAST  Result Date: 05/26/2023 CLINICAL DATA:  Code stroke. Initial evaluation for neuro deficit, stroke. EXAM: CT  HEAD WITHOUT CONTRAST TECHNIQUE: Contiguous axial images were obtained from the base of the skull through the vertex without intravenous contrast. RADIATION DOSE REDUCTION: This exam was performed according to the departmental dose-optimization program which includes automated exposure control, adjustment of the mA and/or kV according to patient size and/or use of iterative reconstruction technique. COMPARISON:  Prior study from 05/15/2018 FINDINGS: Brain: Massive acute intraparenchymal hemorrhage involving the left cerebral hemisphere is seen. Bleed measures approximately 4.8 x 9.4 x 5.2 cm (estimated volume 117 mL). Surrounding vasogenic edema with regional mass effect. Left lateral ventricle is effaced. Associated up to 1.4 cm of left-to-right shift. Asymmetric dilatation of the right lateral ventricle concerning for trapping. Secondary basilar cistern crowding without transtentorial herniation. Possible trace intraventricular extension with blood in the compressed third ventricle versus an additional separate but small bleed at the left thalamus noted (series 2, image 17). An additional small 5 mm parenchymal hemorrhage noted at the right parietal convexity (series 2, image 22). No other acute large vessel territory infarct. No visible mass lesion. No significant extra-axial fluid collection. Probable chronic microvascular ischemic changes noted. Vascular: No visible abnormal hyperdense vessel. Scattered calcified atherosclerosis present at skull base. Skull: Right occipital scalp contusion noted noted. No visible calvarial fracture. Sinuses/Orbits: Globes and orbital soft tissues grossly within normal limits. Paranasal sinuses are largely clear. No mastoid effusion. Other: None. ASPECTS [facility] Stroke Program Early CT Score) Acute ICH, does not apply. IMPRESSION: 1. Massive acute intraparenchymal hemorrhage involving the left cerebral hemisphere measuring approximately 4.8 x 9.4 x 5.2 cm (estimated volume 117  mL). Associated regional mass effect with up to 1.4 cm of left-to-right shift. Asymmetric dilatation of the right lateral ventricle concerning for trapping. Sec 2. Additional small 5 mm parenchymal hemorrhage at the right parietal convexity. 3. Small focus of hemorrhage at the level of the medial left thalamus, possibly a small separate bleed versus trace intraventricular extension. Attention at follow-up recommended. 4. Right occipital scalp contusion. No visible calvarial fracture. These results were communicated to Dr. Iver Nestle at 8:28 pm on May 20, 2023 by text page via the [facility] messaging system. Electronically Signed   By: Rise Mu M.D.   On: 05/28/2023 20:30    Pending Labs Unresulted Labs (From admission, onward)    None       Vitals/Pain Today's Vitals   May 20, 2023 2025 05/08/2023 2031 20-May-2023 2331 05/18/23 0634  BP: (!) 139/97   (!) 144/55  Pulse: 87  89 96  Resp: (!) 30   12  Temp: 97.8 F (36.6 C)     TempSrc: Temporal     SpO2: 94%  99% 99%  Weight:  127 lb (57.6 kg)    Height:  5\' 2"  (1.575 m)      Isolation Precautions No active isolations  Medications Medications  sodium chloride flush (NS) 0.9 % injection 3 mL (has no administration in time range)  ondansetron (ZOFRAN) 4 MG/2ML injection (has no administration in time range)  0.9 %  sodium chloride infusion (has no administration in time range)  acetaminophen (TYLENOL) tablet 650 mg (has no administration in time range)    Or  acetaminophen (TYLENOL) suppository 650 mg (has no administration in time range)  glycopyrrolate (ROBINUL) tablet 1 mg ( Oral See Alternative 20-May-2023 2037)    Or  glycopyrrolate (ROBINUL) injection 0.2 mg ( Subcutaneous See Alternative May 20, 2023 2037)  Or  glycopyrrolate (ROBINUL) injection 0.2 mg (0.2 mg Intravenous Given 05/24/2023 2037)  polyvinyl alcohol (LIQUIFILM TEARS) 1.4 % ophthalmic solution 1 drop (has no administration in time range)  midazolam (VERSED) injection 2-4 mg  (has no administration in time range)  morphine (PF) 2 MG/ML injection 2-4 mg (2 mg Intravenous Given 05/28/2023 2037)  ipratropium-albuterol (DUONEB) 0.5-2.5 (3) MG/3ML nebulizer solution 3 mL (3 mLs Nebulization Not Given 05/18/23 0200)  albuterol (PROVENTIL) (2.5 MG/3ML) 0.083% nebulizer solution 2.5 mg (has no administration in time range)  ondansetron (ZOFRAN-ODT) disintegrating tablet 4 mg (has no administration in time range)    Or  ondansetron (ZOFRAN) injection 4 mg (has no administration in time range)  sodium chloride flush (NS) 0.9 % injection 3 mL (has no administration in time range)  sodium chloride flush (NS) 0.9 % injection 3 mL (has no administration in time range)  0.9 %  sodium chloride infusion (has no administration in time range)  diphenhydrAMINE (BENADRYL) injection 25 mg (has no administration in time range)  ondansetron (ZOFRAN) injection 4 mg (4 mg Intravenous Given 05/28/2023 2030)    Mobility     Focused Assessments    R Recommendations: See Admitting Provider Note  Report given to:   Additional Notes:

## 2023-05-18 NOTE — Progress Notes (Signed)
Brief progress note: -Patient was admitted earlier today following a fall.  CT head revealed intracerebral bleed. -See H&P done on admission: "Amy [name] is a 89 y.o. female with medical history significant of essential hypertension, hyperlipidemia, paroxysmal atrial fibrillation (not currently on Xarelto since April 2024) disease, remote GI bleed and history of heart failure reduced EF 20 to 25% presented to emergency department with complaining of right-sided weakness and confusion. Per chart review and history from patient's son at the bedside patient was fine with her usual state of health today. He had last seen her at noon.  However just past 7 PM and a neighbor called him telling him that the patient had called the neighbor reporting that she had had a fall, and the patient appeared quite confused on the phone; a neighbor had last spoken to the patient at 5 PM at which time she was fine.  Son came home to see her while activating EMS and noted she was weak on the right side with confusion.  Initial blood pressure on EMS arrival was 200s over 100s and sugar was 177.  En route with EMS she began to have respiratory difficulty desatting to the 70s and requiring nonrebreather to maintain her O2 sats at 95 with lung sounds concerning for pulmonary edema.  She was also becoming sleepier and not moving any of her extremities well by the time of arrival.   ED Course:  At presentation to ED heart rate 87, respiratory rate 30, blood pressure 137/97 and O2 sat 94% on nonrebreather 15 L.  CMP showed sodium 135, potassium 4.8, chloride 97, bicarb 20, blood glucose 172, BUN 22, creatinine 1.17 and GFR 45. CBC WBC 11.7, RBC 4.82, hemoglobin 14.9 and platelet 276. aPTT 12.6 and INR 0.9 WNL.   Code stroke called in the ED.  CT head showed massive acute intraparenchymal hemorrhage involving left cerebral hemisphere 4.8 x 9.4 x 5.2 cm (estimated volume 117 mL). Associated regional mass effect with up to 1.4 cm of  left-to-right shift. Asymmetric dilatation of the right lateral ventricle concerning for trapping.  Additional small 5 mm parenchymal hemorrhage at the right parietal convexity. . Small focus of hemorrhage at the level of the medial left  thalamus, possibly a small separate bleed versus trace intraventricular extension. Attention at follow-up recommended.. Right occipital scalp contusion. No visible calvarial fracture.   Patient is being evaluated by neurologist Dr. Iver Nestle in the ED.  Due to devastating intracranial hemorrhage with advanced age with multiple disease burden goals of care discussed with patient's family, comfort care measures are appropriate and patient's son agrees with comfort care measures.  Patient was already on DNR status before this incident happened.   Comfort care initiated at the emergency department.  Neurology signed off.  Appreciate input".   05/18/2023: Patient seen alongside patient's 2 sons and grandson.  Patient is now comfort directed care only.  Input from the palliative care team is appreciated.  Patient seems to be chest stockings.  Goal remains to keep patient comfortable.  Questions from 2 sons answered.  Patient's family updated.

## 2023-05-19 DIAGNOSIS — R0609 Other forms of dyspnea: Secondary | ICD-10-CM | POA: Diagnosis not present

## 2023-05-19 DIAGNOSIS — R451 Restlessness and agitation: Secondary | ICD-10-CM

## 2023-05-19 DIAGNOSIS — Z66 Do not resuscitate: Secondary | ICD-10-CM | POA: Diagnosis not present

## 2023-05-19 DIAGNOSIS — I629 Nontraumatic intracranial hemorrhage, unspecified: Secondary | ICD-10-CM

## 2023-05-19 MED ORDER — MORPHINE 100MG IN NS 100ML (1MG/ML) PREMIX INFUSION
1.0000 mg/h | INTRAVENOUS | Status: DC
  Administered 2023-05-19: 1 mg/h via INTRAVENOUS
  Filled 2023-05-19: qty 100

## 2023-05-19 MED ORDER — MORPHINE BOLUS VIA INFUSION
1.0000 mg | INTRAVENOUS | Status: DC | PRN
  Administered 2023-05-19: 1 mg via INTRAVENOUS

## 2023-05-19 NOTE — Progress Notes (Signed)
Morphine drip started with Orpah Cobb, LPN.

## 2023-05-19 NOTE — Progress Notes (Signed)
PROGRESS NOTE    Amy [name]  HKV:[id number] DOB: 12/03/1932 DOA: May 23, 2023 PCP: Tally Joe, MD  Outpatient Specialists:    Brief Narrative:  Patient is an 89 year old female with past medical history significant for paroxysmal atrial fibrillation, not on anticoagulation; hypertension, hyperlipidemia, heart failure with reduced ejection fraction 20 to 25% and history of GI bleed.  Patient was admitted with intraparenchymal cerebral bleed likely secondary to a fall.  CT head code stroke without contrast revealed: 1. Massive acute intraparenchymal hemorrhage involving the left cerebral hemisphere measuring approximately 4.8 x 9.4 x 5.2 cm (estimated volume 117 mL). Associated regional mass effect with up to 1.4 cm of left-to-right shift. Asymmetric dilatation of the right lateral ventricle concerning for trapping. Sec 2. Additional small 5 mm parenchymal hemorrhage at the right parietal convexity. 3. Small focus of hemorrhage at the level of the medial left thalamus, possibly a small separate bleed versus trace intraventricular extension. Attention at follow-up recommended. 4. Right occipital scalp contusion. No visible calvarial fracture.  05/19/2023: Patient has been made comfort directed care only.  Cheyne-Stokes breathing noted.  Patient is not on morphine drip, to be titrated to comfort.  Palliative care team's input is highly appreciated.   Assessment & Plan:   Principal Problem:   Intracranial hemorrhage (HCC) Active Problems:   Essential hypertension   Hemorrhagic stroke (HCC)   History of atrial fibrillation   Hemorrhage of left cerebral hemisphere following fall (HCC)   Acute metabolic encephalopathy   Brain bleed (HCC)   Acute debility encephalopathy-secondary from massive cerebral hemorrhage Hemorrhage stroke Left cerebral hemorrhage Right-sided weakness Acute hypoxic respiratory failure -Comfort directed care.   DVT prophylaxis: None Code Status:  DNR/DNI Family Communication:  Disposition Plan: Comfort care   Consultants:  Palliative care  Procedures:  None  Antimicrobials:  None   Subjective: No history from patient.  Objective: Vitals:   05/18/23 0908 05/19/23 0222 05/19/23 0907 05/19/23 1051  BP:  (!) 141/52 (!) 142/64   Pulse:  (!) 112 (!) 119   Resp:  (!) 26 15 (!) 36  Temp:  (!) 102.2 F (39 C)    TempSrc:  Oral    SpO2: 98% 97% (!) 87%   Weight:      Height:        Intake/Output Summary (Last 24 hours) at 05/19/2023 2024 Last data filed at 05/19/2023 1621 Gross per 24 hour  Intake 4.7 ml  Output --  Net 4.7 ml   Filed Weights   May 23, 2023 2013 May 23, 2023 2031  Weight: 57.8 kg 57.6 kg    Examination: General exam: Unresponsive.  Comfortable.  Cheyne-Stokes breathing noted.    Respiratory system: Clear to auscultation.  Cardiovascular system: S1 & S2 heard Gastrointestinal system: Abdomen is soft and nontender.   Central nervous system: Unresponsive..   Data Reviewed: I have personally reviewed following labs and imaging studies  CBC: Recent Labs  Lab 05/23/2023 2008 05/23/23 2010  WBC 11.7*  --   NEUTROABS 4.7  --   HGB 14.9 15.6*  HCT 45.6 46.0  MCV 94.6  --   PLT 276  --    Basic Metabolic Panel: Recent Labs  Lab 2023/05/23 2008 05/23/2023 2010  NA 135 135  K 4.8 4.7  CL 97* 102  CO2 20*  --   GLUCOSE 172* 174*  BUN 22 25*  CREATININE 1.17* 1.10*  CALCIUM 9.9  --    GFR: Estimated Creatinine Clearance: 28 mL/min (A) (by C-G formula based on SCr of 1.1 mg/dL (  H)). Liver Function Tests: Recent Labs  Lab 05/19/2023 2008  AST 42*  ALT 29  ALKPHOS 95  BILITOT 0.5  PROT 8.0  ALBUMIN 4.5   No results for input(s): "LIPASE", "AMYLASE" in the last 168 hours. No results for input(s): "AMMONIA" in the last 168 hours. Coagulation Profile: Recent Labs  Lab 05/13/2023 2008  INR 0.9   Cardiac Enzymes: No results for input(s): "CKTOTAL", "CKMB", "CKMBINDEX", "TROPONINI" in the last  168 hours. BNP (last 3 results) No results for input(s): "PROBNP" in the last 8760 hours. HbA1C: No results for input(s): "HGBA1C" in the last 72 hours. CBG: Recent Labs  Lab 05/02/2023 2006  GLUCAP 171*   Lipid Profile: No results for input(s): "CHOL", "HDL", "LDLCALC", "TRIG", "CHOLHDL", "LDLDIRECT" in the last 72 hours. Thyroid Function Tests: No results for input(s): "TSH", "T4TOTAL", "FREET4", "T3FREE", "THYROIDAB" in the last 72 hours. Anemia Panel: No results for input(s): "VITAMINB12", "FOLATE", "FERRITIN", "TIBC", "IRON", "RETICCTPCT" in the last 72 hours. Urine analysis:    Component Value Date/Time   COLORURINE YELLOW 04/03/2022 1805   APPEARANCEUR HAZY (A) 04/03/2022 1805   LABSPEC 1.014 04/03/2022 1805   PHURINE 6.0 04/03/2022 1805   GLUCOSEU NEGATIVE 04/03/2022 1805   HGBUR LARGE (A) 04/03/2022 1805   BILIRUBINUR NEGATIVE 04/03/2022 1805   BILIRUBINUR negative 10/24/2016 1106   KETONESUR NEGATIVE 04/03/2022 1805   PROTEINUR NEGATIVE 04/03/2022 1805   UROBILINOGEN 1.0 10/24/2016 1106   NITRITE NEGATIVE 04/03/2022 1805   LEUKOCYTESUR SMALL (A) 04/03/2022 1805   Sepsis Labs: @LABRCNTIP (procalcitonin:4,lacticidven:4)  )No results found for this or any previous visit (from the past 240 hour(s)).       Radiology Studies: No results found.      Scheduled Meds:  sodium chloride flush  3 mL Intravenous Once   sodium chloride flush  3 mL Intravenous Q12H   Continuous Infusions:  sodium chloride     morphine 1 mg/hr (05/19/23 1139)     LOS: 1 day    Time spent: 35 minutes    Berton Mount, MD  [facility] Pager #: [phone] 7PM-7AM contact night coverage as above

## 2023-05-19 NOTE — Progress Notes (Addendum)
This chaplain responded to MD consult for family spiritual care in the setting of Pt. EOL. The Pt. appears to resting without distress. Family is not at the bedside.  The chaplain entered with a quiet presence, adjusted the Pt. blankets, and offered words of comfort at the bedside without a response from the Pt. The chaplain is available for F/U spiritual care as needed.  **1410 This chaplain attempted second visit with the Pt. and family. Missed family as they were stepping away.  Chaplain Stephanie Acre [phone]

## 2023-05-19 NOTE — Progress Notes (Signed)
Patient ID: Amy [name], female   DOB: Nov 06, 1932, 89 y.o.   MRN: [id number]    Progress Note from the Palliative Medicine Team at [facility]   Patient Name: Amy [name]        Date: 05/19/2023 DOB: Jan 10, 1933  Age: 89 y.o. MRN#: [id number] Attending Physician: Barnetta Chapel, MD Primary Care Physician: Tally Joe, MD Admit Date: 05/09/2023    Extensive chart review has been completed prior to meeting with patient/family  including labs, vital signs, imaging, progress/consult notes, orders, medications and available advance directive documents.    This NP assessed patient at the bedside as a follow up to  yesterday's GOCs meeting.  Patient's 3 children at bedside  89 y.o. female  with past medical history of HTN, HLD, paroxysmal a-fib, remote GI bleed, and heart failure (EF 20-25%) admitted on 05/02/2023 by EMS with right-sided weakness and confusion.    Code stroke called in the ED. CT head showed massive acute intraparenchymal hemorrhage involving left cerebral hemisphere and 1.4 cm left-to-right shift. Son agreed to comfort measures.  Education offered on the natural trajectory and expectations at end-of-life.   Occasion offered on symptom management specific to dyspnea,Pain and agitation at end-of-life  All family at bedside understand the limited prognosis and hope is for comfort and dignity allowing for natural death.   Plan of Care: -DNR/DNI -No artificial feeding or hydration now or in the future -No further diagnostics, no further life-prolonging measures -Focus of care is comfort and dignity at end-of-life       -Symptom management       Pain/dyspnea-initiate morphine drip with bolus option       Terminal secretions-Robinul 0.2 mg IV every 4 hours as needed        Agitation-Versed IV 2 to 4 mg every 4 hours as needed  Prognosis is likely hours to days.  Expect a  hospital death  Emotional support offered   Questions and concerns addressed   Discussed with Dr  Dartha Lodge   Time: 50 minutes  Detailed review of medical records ( labs, imaging, vital signs), medically appropriate exam ( MS, skin, resp)   discussed with treatment team, counseling and education to patient, family, staff, documenting clinical information, medication management, coordination of care    Amy Creed NP  Palliative Medicine Team Team Phone # [phone] Pager [phone]

## 2023-05-19 NOTE — Progress Notes (Signed)
Started Morphine drip. Family at bedside. Marchelle Folks RN witness the IV setup. Will monitor patient to if the medication is effective.

## 2023-05-20 DIAGNOSIS — G935 Compression of brain: Secondary | ICD-10-CM | POA: Insufficient documentation

## 2023-05-20 DIAGNOSIS — I629 Nontraumatic intracranial hemorrhage, unspecified: Secondary | ICD-10-CM | POA: Diagnosis not present

## 2023-05-29 NOTE — Progress Notes (Signed)
   2023-06-15 [id number]  Attending Physican Contact  Attending Physician Notified Y  Attending Physician (First and Last Name) Yevonne Pax  Post Mortem Checklist  Date of Death 06-15-2023  Time of Death 0926  Pronounced By Margaretmary Dys RN  Next of kin notified Yes  Name of next of kin notified of death Poonam Woehrle  Contact Person's Relationship to Patient Son  Contact Person's Phone Number [phone]  Was the patient a No Code Blue or a Limited Code Blue? Yes  Did the patient die unattended? Yes  Patient restrained? Not applicable  Height 5\' 2"  (1.575 m)  Weight 57.6 kg  Body preparation complete Y  [employer] (previously known as Administrator, sports)  Notification Date 06-15-2023  Notification Time 0933  [employer] Number [phone]  Is patient a potential donor? N  Autopsy  Autopsy requested by MD or Family ( Non ME Case) N/A  Patient and Hospital Property Returned  Patient is satisfied that all belongings have been returned? Yes Francee Piccolo has belongings)  Name of person receiving valuables? [place]  Dermatherapy linen/gowns NOT sent with patient or transporter Not applicable  Dead on Arrival (Emergency Department)  Patient dead on arrival? No  Notifications  Patient Placement notified that Post Mortem checklist is complete Yes  Patient Placement notified body transferred Transported to morgue  Medical Examiner  Is this a medical examiner's case? N  Date Medical Examiner notified Jun 15, 2023  Time Medical Examiner notified 1000  Does the Medical Examiner consider this an ME case? N  Funeral Home  Funeral home name/address/phone # [facility] unknown/ undecided  Planned location of pickup [facility]   Family will call for funeral home information.

## 2023-05-29 NOTE — Death Summary Note (Signed)
DEATH SUMMARY   Patient Details  Name: Amy [name] MRN: [id number] DOB: November 19, 1932 [id number], [name], MD Admission/Discharge Information   Admit Date:  06/01/2023  Date of Death: Date of Death: 2023-06-04  Time of Death: Time of Death: 0926  Length of Stay: 2   Principle Cause of death: Hemorrhagic stroke  Hospital Diagnoses: Principal Problem:   Intracranial hemorrhage (HCC) Active Problems:   Hemorrhagic stroke (HCC)   Hemorrhage of left cerebral hemisphere following fall (HCC)   Essential hypertension   History of atrial fibrillation   Acute metabolic encephalopathy   Brain bleed (HCC)   Midline shift of brain with brain compression [facility])   Hospital Course: Amy [name] is a 89 y.o. female with a history of primary hypertension, hyperlipidemia, paroxysmal atrial fibrillation not on anticoagulation, GI bleeding, systolic heart failure.  Patient presented secondary to fall and confusion and she was found to have evidence of a massive acute intraparenchymal hemorrhage involving the left cerebral hemisphere, in addition to a small 5 mm parenchymal hemorrhage at the right parietal convexity. Neurology and palliative care consulted. With degree of stroke, advanced age and multiple disease burden, decision made with family to transition to comfort measures. Case discussed with the medical examiner on call  and since stroke likely precipitated her fall, with no evidence to suggest her fall contributed to her presentation and eventual death, case declined by the medical examiner.   Procedures: None  Consultations: Neurology, Palliative care medicine  The results of significant diagnostics from this hospitalization (including imaging, microbiology, ancillary and laboratory) are listed below for reference.   Significant Diagnostic Studies: CT HEAD CODE STROKE WO CONTRAST  Result Date: 06-01-23 CLINICAL DATA:  Code stroke. Initial evaluation for neuro deficit, stroke. EXAM: CT HEAD  WITHOUT CONTRAST TECHNIQUE: Contiguous axial images were obtained from the base of the skull through the vertex without intravenous contrast. RADIATION DOSE REDUCTION: This exam was performed according to the departmental dose-optimization program which includes automated exposure control, adjustment of the mA and/or kV according to patient size and/or use of iterative reconstruction technique. COMPARISON:  Prior study from 05/15/2018 FINDINGS: Brain: Massive acute intraparenchymal hemorrhage involving the left cerebral hemisphere is seen. Bleed measures approximately 4.8 x 9.4 x 5.2 cm (estimated volume 117 mL). Surrounding vasogenic edema with regional mass effect. Left lateral ventricle is effaced. Associated up to 1.4 cm of left-to-right shift. Asymmetric dilatation of the right lateral ventricle concerning for trapping. Secondary basilar cistern crowding without transtentorial herniation. Possible trace intraventricular extension with blood in the compressed third ventricle versus an additional separate but small bleed at the left thalamus noted (series 2, image 17). An additional small 5 mm parenchymal hemorrhage noted at the right parietal convexity (series 2, image 22). No other acute large vessel territory infarct. No visible mass lesion. No significant extra-axial fluid collection. Probable chronic microvascular ischemic changes noted. Vascular: No visible abnormal hyperdense vessel. Scattered calcified atherosclerosis present at skull base. Skull: Right occipital scalp contusion noted noted. No visible calvarial fracture. Sinuses/Orbits: Globes and orbital soft tissues grossly within normal limits. Paranasal sinuses are largely clear. No mastoid effusion. Other: None. ASPECTS [facility] Stroke Program Early CT Score) Acute ICH, does not apply. IMPRESSION: 1. Massive acute intraparenchymal hemorrhage involving the left cerebral hemisphere measuring approximately 4.8 x 9.4 x 5.2 cm (estimated volume 117 mL).  Associated regional mass effect with up to 1.4 cm of left-to-right shift. Asymmetric dilatation of the right lateral ventricle concerning for trapping. Sec 2. Additional small 5 mm parenchymal hemorrhage  at the right parietal convexity. 3. Small focus of hemorrhage at the level of the medial left thalamus, possibly a small separate bleed versus trace intraventricular extension. Attention at follow-up recommended. 4. Right occipital scalp contusion. No visible calvarial fracture. These results were communicated to Dr. Iver Nestle at 8:28 pm on 05/22/2023 by text page via the [facility] messaging system. Electronically Signed   By: Rise Mu M.D.   On: 04/28/2023 20:30    Microbiology: No results found for this or any previous visit (from the past 240 hour(s)).  Time spent: 35 minutes  Signed: Jacquelin Hawking, MD 2023/06/02

## 2023-05-29 NOTE — Hospital Course (Signed)
Amy [name] is a 89 y.o. female with a history of primary hypertension, hyperlipidemia, paroxysmal atrial fibrillation not on anticoagulation, GI bleeding, systolic heart failure.  Patient presented secondary to fall and confusion and she was found to have evidence of a massive acute intraparenchymal hemorrhage involving the left cerebral hemisphere, in addition to a small 5 mm parenchymal hemorrhage at the right parietal convexity. Neurology and palliative care consulted. With degree of stroke, advanced age and multiple disease burden, decision made with family to transition to comfort measures. Case discussed with the medical examiner on call  and since stroke likely precipitated her fall, with no evidence to suggest her fall contributed to her presentation and eventual death, case declined by the medical examiner.

## 2023-05-29 DEATH — deceased
